# Patient Record
Sex: Male | Born: 1935 | Race: Black or African American | Hispanic: No | Smoking: Former smoker
Health system: Southern US, Community
[De-identification: ages and names within clinical notes are randomized; demographics above are authoritative.]

## PROBLEM LIST (undated history)

## (undated) DIAGNOSIS — Z9989 Dependence on other enabling machines and devices: Secondary | ICD-10-CM

## (undated) DIAGNOSIS — E119 Type 2 diabetes mellitus without complications: Secondary | ICD-10-CM

## (undated) DIAGNOSIS — J189 Pneumonia, unspecified organism: Secondary | ICD-10-CM

## (undated) DIAGNOSIS — M1990 Unspecified osteoarthritis, unspecified site: Secondary | ICD-10-CM

## (undated) DIAGNOSIS — R3915 Urgency of urination: Secondary | ICD-10-CM

## (undated) DIAGNOSIS — Z974 Presence of external hearing-aid: Secondary | ICD-10-CM

## (undated) DIAGNOSIS — N189 Chronic kidney disease, unspecified: Secondary | ICD-10-CM

## (undated) DIAGNOSIS — R519 Headache, unspecified: Secondary | ICD-10-CM

## (undated) DIAGNOSIS — R42 Dizziness and giddiness: Secondary | ICD-10-CM

## (undated) DIAGNOSIS — H409 Unspecified glaucoma: Secondary | ICD-10-CM

## (undated) DIAGNOSIS — C61 Malignant neoplasm of prostate: Secondary | ICD-10-CM

## (undated) DIAGNOSIS — E785 Hyperlipidemia, unspecified: Secondary | ICD-10-CM

## (undated) DIAGNOSIS — G609 Hereditary and idiopathic neuropathy, unspecified: Secondary | ICD-10-CM

## (undated) DIAGNOSIS — Z973 Presence of spectacles and contact lenses: Secondary | ICD-10-CM

## (undated) DIAGNOSIS — I10 Essential (primary) hypertension: Secondary | ICD-10-CM

## (undated) DIAGNOSIS — G4700 Insomnia, unspecified: Secondary | ICD-10-CM

## (undated) DIAGNOSIS — R609 Edema, unspecified: Secondary | ICD-10-CM

## (undated) DIAGNOSIS — H04123 Dry eye syndrome of bilateral lacrimal glands: Secondary | ICD-10-CM

## (undated) DIAGNOSIS — Z972 Presence of dental prosthetic device (complete) (partial): Secondary | ICD-10-CM

## (undated) DIAGNOSIS — E559 Vitamin D deficiency, unspecified: Secondary | ICD-10-CM

## (undated) DIAGNOSIS — R51 Headache: Secondary | ICD-10-CM

## (undated) DIAGNOSIS — I499 Cardiac arrhythmia, unspecified: Secondary | ICD-10-CM

## (undated) DIAGNOSIS — K219 Gastro-esophageal reflux disease without esophagitis: Secondary | ICD-10-CM

## (undated) DIAGNOSIS — H269 Unspecified cataract: Secondary | ICD-10-CM

## (undated) DIAGNOSIS — G4733 Obstructive sleep apnea (adult) (pediatric): Secondary | ICD-10-CM

## (undated) DIAGNOSIS — R159 Full incontinence of feces: Secondary | ICD-10-CM

## (undated) DIAGNOSIS — R531 Weakness: Secondary | ICD-10-CM

## (undated) DIAGNOSIS — Z87898 Personal history of other specified conditions: Secondary | ICD-10-CM

## (undated) HISTORY — PX: PROSTATECTOMY: SHX69

## (undated) HISTORY — PX: COLONOSCOPY: SHX174

## (undated) HISTORY — PX: EPIDIDYMIS SURGERY: SHX843

## (undated) HISTORY — PX: HEMORRHOID SURGERY: SHX153

## (undated) HISTORY — PX: CYSTOSCOPY: SUR368

## (undated) HISTORY — PX: PENILE PROSTHESIS IMPLANT: SHX240

## (undated) HISTORY — PX: ANKLE SURGERY: SHX546

## (undated) HISTORY — PX: CARDIAC CATHETERIZATION: SHX172

---

## 2011-10-14 DIAGNOSIS — H269 Unspecified cataract: Secondary | ICD-10-CM | POA: Insufficient documentation

## 2012-01-12 DIAGNOSIS — H409 Unspecified glaucoma: Secondary | ICD-10-CM | POA: Insufficient documentation

## 2015-03-15 DIAGNOSIS — C61 Malignant neoplasm of prostate: Secondary | ICD-10-CM | POA: Insufficient documentation

## 2015-03-15 DIAGNOSIS — N529 Male erectile dysfunction, unspecified: Secondary | ICD-10-CM | POA: Insufficient documentation

## 2015-03-15 DIAGNOSIS — R351 Nocturia: Secondary | ICD-10-CM | POA: Insufficient documentation

## 2015-03-15 DIAGNOSIS — Z8546 Personal history of malignant neoplasm of prostate: Secondary | ICD-10-CM | POA: Insufficient documentation

## 2015-04-08 HISTORY — PX: BIOPSY THYROID: PRO38

## 2015-10-17 DIAGNOSIS — N183 Chronic kidney disease, stage 3 unspecified: Secondary | ICD-10-CM

## 2016-01-16 DIAGNOSIS — K5900 Constipation, unspecified: Secondary | ICD-10-CM | POA: Insufficient documentation

## 2016-01-16 DIAGNOSIS — R350 Frequency of micturition: Secondary | ICD-10-CM | POA: Insufficient documentation

## 2016-01-16 DIAGNOSIS — N183 Chronic kidney disease, stage 3 unspecified: Secondary | ICD-10-CM

## 2017-01-02 DIAGNOSIS — E042 Nontoxic multinodular goiter: Secondary | ICD-10-CM | POA: Insufficient documentation

## 2017-10-29 DIAGNOSIS — R5383 Other fatigue: Secondary | ICD-10-CM | POA: Insufficient documentation

## 2017-10-29 DIAGNOSIS — D696 Thrombocytopenia, unspecified: Secondary | ICD-10-CM | POA: Insufficient documentation

## 2018-10-08 HISTORY — PX: ANAL RECTAL MANOMETRY: SHX6358

## 2018-10-11 ENCOUNTER — Encounter (HOSPITAL_COMMUNITY): Payer: Self-pay

## 2018-10-11 DIAGNOSIS — H04123 Dry eye syndrome of bilateral lacrimal glands: Secondary | ICD-10-CM | POA: Insufficient documentation

## 2018-10-11 NOTE — Pre-Procedure Instructions (Signed)
Dr. Tamala Julian surgical clearance 1010/2019 in hard chart.

## 2018-10-11 NOTE — Patient Instructions (Addendum)
Your procedure is scheduled on: Monday, Dec. 16, 2019   Surgery Time:  11:35AM-12:25PM   Report to [place]  Entrance    Report to admitting at 9:00 AM   Call this number if you have problems the morning of surgery [phone]   Do not eat food or drink liquids :After Midnight.   Brush your teeth the morning of surgery.   Do NOT smoke after Midnight   Take these medicines the morning of surgery with A SIP OF WATER: Amlodipine   Use eye drops per normal routine              You may not have any metal on your body including jewelry, and body piercings             Do not wear lotions, powders, perfumes/cologne, or deodorant                           Men may shave face and neck.   Do not bring valuables to the hospital. [place].   Contacts, dentures or bridgework may not be worn into surgery.   Leave suitcase in the car. After surgery it may be brought to your room.    Special Instructions: Bring a copy of your healthcare power of attorney and living will documents         the day of surgery if you haven't scanned them in before.              Please read over the following fact sheets you were given:  [facility] - Preparing for Surgery Before surgery, you can play an important role.  Because skin is not sterile, your skin needs to be as free of germs as possible.  You can reduce the number of germs on your skin by washing with CHG (chlorahexidine gluconate) soap before surgery.  CHG is an antiseptic cleaner which kills germs and bonds with the skin to continue killing germs even after washing. Please DO NOT use if you have an allergy to CHG or antibacterial soaps.  If your skin becomes reddened/irritated stop using the CHG and inform your nurse when you arrive at Short Stay. Do not shave (including legs and underarms) for at least 48 hours prior to the first CHG shower.  You may shave your  face/neck.  Please follow these instructions carefully:  1.  Shower with CHG Soap the night before surgery and the  morning of surgery.  2.  If you choose to wash your hair, wash your hair first as usual with your normal  shampoo.  3.  After you shampoo, rinse your hair and body thoroughly to remove the shampoo.                             4.  Use CHG as you would any other liquid soap.  You can apply chg directly to the skin and wash.  Gently with a scrungie or clean washcloth.  5.  Apply the CHG Soap to your body ONLY FROM THE NECK DOWN.   Do   not use on face/ open                           Wound or  open sores. Avoid contact with eyes, ears mouth and   genitals (private parts).                       Wash face,  Genitals (private parts) with your normal soap.             6.  Wash thoroughly, paying special attention to the area where your    surgery  will be performed.  7.  Thoroughly rinse your body with warm water from the neck down.  8.  DO NOT shower/wash with your normal soap after using and rinsing off the CHG Soap.                9.  Pat yourself dry with a clean towel.            10.  Wear clean pajamas.            11.  Place clean sheets on your bed the night of your first shower and do not  sleep with pets. Day of Surgery : Do not apply any lotions/deodorants the morning of surgery.  Please wear clean clothes to the hospital/surgery center.  FAILURE TO FOLLOW THESE INSTRUCTIONS MAY RESULT IN THE CANCELLATION OF YOUR SURGERY  PATIENT SIGNATURE_________________________________  NURSE SIGNATURE__________________________________  ________________________________________________________________________   Adam Phenix  An incentive spirometer is a tool that can help keep your lungs clear and active. This tool measures how well you are filling your lungs with each breath. Taking long deep breaths may help reverse or decrease the chance of developing breathing (pulmonary)  problems (especially infection) following:  A long period of time when you are unable to move or be active. BEFORE THE PROCEDURE   If the spirometer includes an indicator to show your best effort, your nurse or respiratory therapist will set it to a desired goal.  If possible, sit up straight or lean slightly forward. Try not to slouch.  Hold the incentive spirometer in an upright position. INSTRUCTIONS FOR USE  1. Sit on the edge of your bed if possible, or sit up as far as you can in bed or on a chair. 2. Hold the incentive spirometer in an upright position. 3. Breathe out normally. 4. Place the mouthpiece in your mouth and seal your lips tightly around it. 5. Breathe in slowly and as deeply as possible, raising the piston or the ball toward the top of the column. 6. Hold your breath for 3-5 seconds or for as long as possible. Allow the piston or ball to fall to the bottom of the column. 7. Remove the mouthpiece from your mouth and breathe out normally. 8. Rest for a few seconds and repeat Steps 1 through 7 at least 10 times every 1-2 hours when you are awake. Take your time and take a few normal breaths between deep breaths. 9. The spirometer may include an indicator to show your best effort. Use the indicator as a goal to work toward during each repetition. 10. After each set of 10 deep breaths, practice coughing to be sure your lungs are clear. If you have an incision (the cut made at the time of surgery), support your incision when coughing by placing a pillow or rolled up towels firmly against it. Once you are able to get out of bed, walk around indoors and cough well. You may stop using the incentive spirometer when instructed by your caregiver.  RISKS AND COMPLICATIONS  Take your time so you  do not get dizzy or light-headed.  If you are in pain, you may need to take or ask for pain medication before doing incentive spirometry. It is harder to take a deep breath if you are having  pain. AFTER USE  Rest and breathe slowly and easily.  It can be helpful to keep track of a log of your progress. Your caregiver can provide you with a simple table to help with this. If you are using the spirometer at home, follow these instructions: [place] IF:   You are having difficultly using the spirometer.  You have trouble using the spirometer as often as instructed.  Your pain medication is not giving enough relief while using the spirometer.  You develop fever of 100.5 F (38.1 C) or higher. SEEK IMMEDIATE MEDICAL CARE IF:   You cough up bloody sputum that had not been present before.  You develop fever of 102 F (38.9 C) or greater.  You develop worsening pain at or near the incision site. MAKE SURE YOU:   Understand these instructions.  Will watch your condition.  Will get help right away if you are not doing well or get worse. Document Released: 03/02/2007 Document Revised: 01/12/2012 Document Reviewed: 05/03/2007 ExitCare Patient Information 2014 Exit[employer], [place].   ________________________________________________________________________  WHAT IS A BLOOD TRANSFUSION? Blood Transfusion Information  A transfusion is the replacement of blood or some of its parts. Blood is made up of multiple cells which provide different functions.  Red blood cells carry oxygen and are used for blood loss replacement.  White blood cells fight against infection.  Platelets control bleeding.  Plasma helps clot blood.  Other blood products are available for specialized needs, such as hemophilia or other clotting disorders. BEFORE THE TRANSFUSION  Who gives blood for transfusions?   Healthy volunteers who are fully evaluated to make sure their blood is safe. This is blood bank blood. Transfusion therapy is the safest it has ever been in the practice of medicine. Before blood is taken from a donor, a complete history is taken to make sure that person has no history  of diseases nor engages in risky social behavior (examples are intravenous drug use or sexual activity with multiple partners). The donor's travel history is screened to minimize risk of transmitting infections, such as malaria. The donated blood is tested for signs of infectious diseases, such as HIV and hepatitis. The blood is then tested to be sure it is compatible with you in order to minimize the chance of a transfusion reaction. If you or a relative donates blood, this is often done in anticipation of surgery and is not appropriate for emergency situations. It takes many days to process the donated blood. RISKS AND COMPLICATIONS Although transfusion therapy is very safe and saves many lives, the main dangers of transfusion include:   Getting an infectious disease.  Developing a transfusion reaction. This is an allergic reaction to something in the blood you were given. Every precaution is taken to prevent this. The decision to have a blood transfusion has been considered carefully by your caregiver before blood is given. Blood is not given unless the benefits outweigh the risks. AFTER THE TRANSFUSION  Right after receiving a blood transfusion, you will usually feel much better and more energetic. This is especially true if your red blood cells have gotten low (anemic). The transfusion raises the level of the red blood cells which carry oxygen, and this usually causes an energy increase.  The nurse administering  the transfusion will monitor you carefully for complications. HOME CARE INSTRUCTIONS  No special instructions are needed after a transfusion. You may find your energy is better. Speak with your caregiver about any limitations on activity for underlying diseases you may have. SEEK MEDICAL CARE IF:   Your condition is not improving after your transfusion.  You develop redness or irritation at the intravenous (IV) site. SEEK IMMEDIATE MEDICAL CARE IF:  Any of the following symptoms  occur over the next 12 hours:  Shaking chills.  You have a temperature by mouth above 102 F (38.9 C), not controlled by medicine.  Chest, back, or muscle pain.  People around you feel you are not acting correctly or are confused.  Shortness of breath or difficulty breathing.  Dizziness and fainting.  You get a rash or develop hives.  You have a decrease in urine output.  Your urine turns a dark color or changes to pink, red, or brown. Any of the following symptoms occur over the next 10 days:  You have a temperature by mouth above 102 F (38.9 C), not controlled by medicine.  Shortness of breath.  Weakness after normal activity.  The white part of the eye turns yellow (jaundice).  You have a decrease in the amount of urine or are urinating less often.  Your urine turns a dark color or changes to pink, red, or brown. Document Released: 10/17/2000 Document Revised: 01/12/2012 Document Reviewed: 06/05/2008 [facility] Patient Information 2014 [place], [place].  _______________________________________________________________________

## 2018-10-12 ENCOUNTER — Encounter (HOSPITAL_COMMUNITY): Payer: Self-pay

## 2018-10-12 ENCOUNTER — Other Ambulatory Visit: Payer: Self-pay

## 2018-10-12 ENCOUNTER — Encounter (HOSPITAL_COMMUNITY)
Admission: RE | Admit: 2018-10-12 | Discharge: 2018-10-12 | Disposition: A | Discharge status: Home / Self Care | Payer: Medicare HMO | Source: Ambulatory Visit | Attending: Orthopedic Surgery | Admitting: Orthopedic Surgery

## 2018-10-12 DIAGNOSIS — Z01812 Encounter for preprocedural laboratory examination: Secondary | ICD-10-CM | POA: Diagnosis present

## 2018-10-12 HISTORY — DX: Essential (primary) hypertension: I10

## 2018-10-12 HISTORY — DX: Headache, unspecified: R51.9

## 2018-10-12 HISTORY — DX: Insomnia, unspecified: G47.00

## 2018-10-12 HISTORY — DX: Full incontinence of feces: R15.9

## 2018-10-12 HISTORY — DX: Urgency of urination: R39.15

## 2018-10-12 HISTORY — DX: Malignant neoplasm of prostate: C61

## 2018-10-12 HISTORY — DX: Hereditary and idiopathic neuropathy, unspecified: G60.9

## 2018-10-12 HISTORY — DX: Chronic kidney disease, unspecified: N18.9

## 2018-10-12 HISTORY — DX: Dependence on other enabling machines and devices: Z99.89

## 2018-10-12 HISTORY — DX: Hyperlipidemia, unspecified: E78.5

## 2018-10-12 HISTORY — DX: Obstructive sleep apnea (adult) (pediatric): G47.33

## 2018-10-12 HISTORY — DX: Gastro-esophageal reflux disease without esophagitis: K21.9

## 2018-10-12 HISTORY — DX: Unspecified cataract: H26.9

## 2018-10-12 HISTORY — DX: Unspecified glaucoma: H40.9

## 2018-10-12 HISTORY — DX: Vitamin D deficiency, unspecified: E55.9

## 2018-10-12 HISTORY — DX: Weakness: R53.1

## 2018-10-12 HISTORY — DX: Dizziness and giddiness: R42

## 2018-10-12 HISTORY — DX: Headache: R51

## 2018-10-12 HISTORY — DX: Personal history of other specified conditions: Z87.898

## 2018-10-12 HISTORY — DX: Presence of dental prosthetic device (complete) (partial): Z97.2

## 2018-10-12 HISTORY — DX: Edema, unspecified: R60.9

## 2018-10-12 HISTORY — DX: Type 2 diabetes mellitus without complications: E11.9

## 2018-10-12 HISTORY — DX: Presence of external hearing-aid: Z97.4

## 2018-10-12 HISTORY — DX: Presence of spectacles and contact lenses: Z97.3

## 2018-10-12 HISTORY — DX: Unspecified osteoarthritis, unspecified site: M19.90

## 2018-10-12 HISTORY — DX: Dry eye syndrome of bilateral lacrimal glands: H04.123

## 2018-10-12 LAB — COMPREHENSIVE METABOLIC PANEL
ALT: 18 U/L (ref 0–44)
AST: 22 U/L (ref 15–41)
Albumin: 3.7 g/dL (ref 3.5–5.0)
Alkaline Phosphatase: 56 U/L (ref 38–126)
Anion gap: 7 (ref 5–15)
BUN: 18 mg/dL (ref 8–23)
CO2: 24 mmol/L (ref 22–32)
Calcium: 8.9 mg/dL (ref 8.9–10.3)
Chloride: 111 mmol/L (ref 98–111)
Creatinine, Ser: 1.34 mg/dL — ABNORMAL HIGH (ref 0.61–1.24)
GFR calc Af Amer: 58 mL/min — ABNORMAL LOW (ref >60)
GFR calc non Af Amer: 50 mL/min — ABNORMAL LOW (ref >60)
Glucose, Bld: 89 mg/dL (ref 70–99)
Potassium: 3.8 mmol/L (ref 3.5–5.1)
SODIUM: 142 mmol/L (ref 135–145)
Total Bilirubin: 0.7 mg/dL (ref 0.3–1.2)
Total Protein: 6.5 g/dL (ref 6.5–8.1)

## 2018-10-12 LAB — CBC
HCT: 43.8 % (ref 39.0–52.0)
Hemoglobin: 14.1 g/dL (ref 13.0–17.0)
MCH: 27.9 pg (ref 26.0–34.0)
MCHC: 32.2 g/dL (ref 30.0–36.0)
MCV: 86.7 fL (ref 80.0–100.0)
Platelets: 149 10*3/uL — ABNORMAL LOW (ref 150–400)
RBC: 5.05 MIL/uL (ref 4.22–5.81)
RDW: 13.8 % (ref 11.5–15.5)
WBC: 3.7 10*3/uL — ABNORMAL LOW (ref 4.0–10.5)
nRBC: 0 % (ref 0.0–0.2)

## 2018-10-12 LAB — HEMOGLOBIN A1C
Hgb A1c MFr Bld: 5.6 % (ref 4.8–5.6)
MEAN PLASMA GLUCOSE: 114.02 mg/dL

## 2018-10-12 LAB — PROTIME-INR
INR: 1.09
Prothrombin Time: 14 seconds (ref 11.4–15.2)

## 2018-10-12 LAB — APTT: APTT: 34 s (ref 24–36)

## 2018-10-12 LAB — SURGICAL PCR SCREEN
MRSA, PCR: NEGATIVE
Staphylococcus aureus: NEGATIVE

## 2018-10-12 NOTE — Pre-Procedure Instructions (Addendum)
EKG 10/04/2018 in hard chart.  CBC and CMP results 10/12/2018 sent to Dr. Wynelle Link via epic.

## 2018-10-13 LAB — ABO/RH: ABO/RH(D): O POS

## 2018-10-15 NOTE — H&P (Signed)
TOTAL KNEE ADMISSION H&P  Patient is being admitted for right total knee arthroplasty.  Subjective:  Chief Complaint:right knee pain.  HPI: Drew Hudson, 82 y.o. male, has a history of pain and functional disability in the right knee due to arthritis and has failed non-surgical conservative treatments for greater than 12 weeks to includecorticosteriod injections, viscosupplementation injections and activity modification.  Onset of symptoms was gradual, starting >10 years ago with gradually worsening course since that time. The patient noted no past surgery on the right knee(s).  Patient currently rates pain in the right knee(s) at 9 out of 10 with activity. Patient has worsening of pain with activity and weight bearing, crepitus, joint swelling and instability.  Patient has evidence of bone-on-bone arthritis in the medial and patellofemoral compartments by imaging studies. There is no active infection.  There are no active problems to display for this patient.  Past Medical History:  Diagnosis Date  . Arthritis   . Cataract   . Chronic kidney disease    Stage 3 per pt  . Diabetes mellitus without complication (HCC)    diet controlled  . Dizziness   . Dry eyes   . Edema    Bilateral legs  . Fecal incontinence   . Generalized weakness   . GERD (gastroesophageal reflux disease)   . Glaucoma   . Headache   . History of chest pain    Saw cardiologist, no blockages noted, possible gas pains  . Hyperlipidemia   . Hypertension   . Idiopathic peripheral neuropathy   . Insomnia   . OSA on CPAP   . Prostate cancer ([place])   . Urinary urgency   . Vitamin D deficiency   . Wears glasses   . Wears hearing aid in both ears   . Wears partial dentures     Past Surgical History:  Procedure Laterality Date  . ANAL RECTAL MANOMETRY  10/08/2018  . ANKLE SURGERY    . BIOPSY THYROID Left 04/08/2015  . CARDIAC CATHETERIZATION    . COLONOSCOPY    . CYSTOSCOPY    . EPIDIDYMIS SURGERY    .  HEMORRHOID SURGERY    . PENILE PROSTHESIS IMPLANT    . PROSTATECTOMY      No current facility-administered medications for this encounter.    Current Outpatient Medications  Medication Sig Dispense Refill Last Dose  . amLODipine (NORVASC) 10 MG tablet Take 10 mg by mouth daily.  2   . aspirin EC 81 MG tablet Take 81 mg by mouth daily.     Marland Kitchen atorvastatin (LIPITOR) 20 MG tablet Take 20 mg by mouth daily.  0   . dorzolamide-timolol (COSOPT) 22.3-6.8 MG/ML ophthalmic solution Place 1 drop into both eyes 2 (two) times daily.  0   . hydrocortisone 2.5 % cream Apply 1 application topically daily as needed (itching).     Marland Kitchen latanoprost (XALATAN) 0.005 % ophthalmic solution Place 1 drop into both eyes every morning.  0   . lisinopril (PRINIVIL,ZESTRIL) 20 MG tablet Take 20 mg by mouth daily.  1   . omeprazole (PRILOSEC) 20 MG capsule Take 20 mg by mouth daily.  3   . polyethylene glycol (MIRALAX / GLYCOLAX) packet Take 17 g by mouth daily as needed for moderate constipation.     . sertraline (ZOLOFT) 25 MG tablet Take 25 mg by mouth at bedtime.  1   . vitamin E 400 UNIT capsule Take 400 Units by mouth daily.      Allergies  Allergen Reactions  . Penicillins Rash    Has patient had a PCN reaction causing immediate rash, facial/tongue/throat swelling, SOB or lightheadedness with hypotension: No Has patient had a PCN reaction causing severe rash involving mucus membranes or skin necrosis: No Has patient had a PCN reaction that required hospitalization: No Has patient had a PCN reaction occurring within the last 10 years: Yes If all of the above answers are "NO", then may proceed with Cephalosporin use.     Social History   Tobacco Use  . Smoking status: Former Research scientist (life sciences)  . Smokeless tobacco: Never Used  Substance Use Topics  . Alcohol use: Never    Frequency: Never    No family history on file.   Review of Systems  Constitutional: Negative for chills and fever.  HENT: Negative for  congestion, sore throat and tinnitus.   Eyes: Negative for double vision, photophobia and pain.  Respiratory: Negative for cough, shortness of breath and wheezing.   Cardiovascular: Negative for chest pain, palpitations and orthopnea.  Gastrointestinal: Negative for heartburn, nausea and vomiting.  Genitourinary: Negative for dysuria, frequency and urgency.  Musculoskeletal: Positive for joint pain.  Neurological: Negative for dizziness, weakness and headaches.    Objective:  Physical Exam  Well nourished and well developed. General: Alert and oriented x3, cooperative and pleasant, no acute distress. Head: normocephalic, atraumatic, neck supple. Eyes: EOMI. Respiratory: breath sounds clear in all fields, no wheezing, rales, or rhonchi. Cardiovascular: Regular rate and rhythm, no murmurs, gallops or rubs.  Abdomen: non-tender to palpation and soft, normoactive bowel sounds. Musculoskeletal: Antalgic gait pattern favoring the right side without using assisted devices.  Right Hip Exam: ROM: Normal without discomfort. There is no tenderness over the greater trochanter bursa. Right Knee Exam: Moderate effusion. Varus deformity. Range of motion is 5-125 degrees. Moderate crepitus on range of motion of the knee. Positive medial, greater than lateral, joint line tenderness. Stable knee. Left Hip Exam: ROM: Normal without discomfort. There is no tenderness over the greater trochanter bursa.  Left Knee Exam: No effusion. Range of motion is 0-135 degrees. No crepitus on range of motion of the knee. No medial joint line tenderness. No lateral joint line tenderness. Stable knee. Calves soft and nontender. Motor function intact in LE. Strength 5/5 LE bilaterally. Neuro: Distal pulses 2+. Sensation to light touch intact in LE.  Estimated body mass index is 30.94 kg/m as calculated from the following:   Height as of 10/12/18: 5' 11.5" (1.816 m).   Weight as of 10/12/18: 102.1 kg.   Imaging  Review Plain radiographs demonstrate severe degenerative joint disease of the right knee(s). The overall alignment isneutral. The bone quality appears to be adequate for age and reported activity level.   Preoperative templating of the joint replacement has been completed, documented, and submitted to the Operating Room personnel in order to optimize intra-operative equipment management.   Anticipated LOS equal to or greater than 2 midnights due to - Age 60 and older with one or more of the following:  - Obesity  - Expected need for hospital services (PT, OT, Nursing) required for safe  discharge  - Anticipated need for postoperative skilled nursing care or inpatient rehab  - Active co-morbidities: Diabetes OR   - Unanticipated findings during/Post Surgery: None  - Patient is a high risk of re-admission due to: None     Assessment/Plan:  End stage arthritis, right knee   The patient history, physical examination, clinical judgment of the provider and imaging studies are  consistent with end stage degenerative joint disease of the right knee(s) and total knee arthroplasty is deemed medically necessary. The treatment options including medical management, injection therapy arthroscopy and arthroplasty were discussed at length. The risks and benefits of total knee arthroplasty were presented and reviewed. The risks due to aseptic loosening, infection, stiffness, patella tracking problems, thromboembolic complications and other imponderables were discussed. The patient acknowledged the explanation, agreed to proceed with the plan and consent was signed. Patient is being admitted for inpatient treatment for surgery, pain control, PT, OT, prophylactic antibiotics, VTE prophylaxis, progressive ambulation and ADL's and discharge planning. The patient is planning to be discharged home with outpatient physical therapy.   Therapy Plans: outpatient physical therapy at [facility] Disposition: Home with  wife Planned DVT Prophylaxis: Xarelto 10 mg daily (hx prostate CA) DME needed: Gilford Rile, 3-n-1 PCP: Sinclair Ship, MD TXA: Iv Allergies: PCN (hives) Anesthesia Concerns: None Last HgbA1c: 5.6%  - Patient was instructed on what medications to stop prior to surgery. - Follow-up visit in 2 weeks with Dr. Wynelle Link - Begin physical therapy following surgery - Pre-operative lab work as pre-surgical testing - Prescriptions will be provided in hospital at time of discharge  Theresa Duty, PA-C Orthopedic Surgery [facility] [place]

## 2018-10-17 MED ORDER — BUPIVACAINE LIPOSOME 1.3 % IJ SUSP
20.0000 mL | Freq: Once | INTRAMUSCULAR | Status: DC
  Filled 2018-10-17: qty 20

## 2018-10-18 ENCOUNTER — Encounter (HOSPITAL_COMMUNITY)
Admission: RE | Disposition: A | Discharge status: Home / Self Care | Payer: Self-pay | Source: Ambulatory Visit | Attending: Orthopedic Surgery

## 2018-10-18 ENCOUNTER — Other Ambulatory Visit: Payer: Self-pay

## 2018-10-18 ENCOUNTER — Observation Stay (HOSPITAL_COMMUNITY)
Admission: RE | Admit: 2018-10-18 | Discharge: 2018-10-20 | Disposition: A | Discharge status: Home / Self Care | Payer: Medicare HMO | Source: Ambulatory Visit | Attending: Orthopedic Surgery | Admitting: Orthopedic Surgery

## 2018-10-18 ENCOUNTER — Ambulatory Visit (HOSPITAL_COMMUNITY): Payer: Medicare HMO | Admitting: Anesthesiology

## 2018-10-18 ENCOUNTER — Encounter (HOSPITAL_COMMUNITY): Payer: Self-pay

## 2018-10-18 DIAGNOSIS — H409 Unspecified glaucoma: Secondary | ICD-10-CM | POA: Insufficient documentation

## 2018-10-18 DIAGNOSIS — R609 Edema, unspecified: Secondary | ICD-10-CM | POA: Insufficient documentation

## 2018-10-18 DIAGNOSIS — E669 Obesity, unspecified: Secondary | ICD-10-CM | POA: Diagnosis not present

## 2018-10-18 DIAGNOSIS — G4733 Obstructive sleep apnea (adult) (pediatric): Secondary | ICD-10-CM | POA: Diagnosis not present

## 2018-10-18 DIAGNOSIS — I129 Hypertensive chronic kidney disease with stage 1 through stage 4 chronic kidney disease, or unspecified chronic kidney disease: Secondary | ICD-10-CM | POA: Insufficient documentation

## 2018-10-18 DIAGNOSIS — M1711 Unilateral primary osteoarthritis, right knee: Principal | ICD-10-CM | POA: Insufficient documentation

## 2018-10-18 DIAGNOSIS — N183 Chronic kidney disease, stage 3 (moderate): Secondary | ICD-10-CM | POA: Insufficient documentation

## 2018-10-18 DIAGNOSIS — E1122 Type 2 diabetes mellitus with diabetic chronic kidney disease: Secondary | ICD-10-CM | POA: Insufficient documentation

## 2018-10-18 DIAGNOSIS — E785 Hyperlipidemia, unspecified: Secondary | ICD-10-CM | POA: Diagnosis not present

## 2018-10-18 DIAGNOSIS — Z79899 Other long term (current) drug therapy: Secondary | ICD-10-CM | POA: Insufficient documentation

## 2018-10-18 DIAGNOSIS — Z87891 Personal history of nicotine dependence: Secondary | ICD-10-CM | POA: Insufficient documentation

## 2018-10-18 DIAGNOSIS — Z8546 Personal history of malignant neoplasm of prostate: Secondary | ICD-10-CM | POA: Insufficient documentation

## 2018-10-18 DIAGNOSIS — M179 Osteoarthritis of knee, unspecified: Secondary | ICD-10-CM | POA: Diagnosis present

## 2018-10-18 DIAGNOSIS — Z7982 Long term (current) use of aspirin: Secondary | ICD-10-CM | POA: Insufficient documentation

## 2018-10-18 DIAGNOSIS — Z6830 Body mass index (BMI) 30.0-30.9, adult: Secondary | ICD-10-CM | POA: Diagnosis not present

## 2018-10-18 DIAGNOSIS — M1710 Unilateral primary osteoarthritis, unspecified knee: Secondary | ICD-10-CM | POA: Diagnosis present

## 2018-10-18 DIAGNOSIS — K219 Gastro-esophageal reflux disease without esophagitis: Secondary | ICD-10-CM | POA: Diagnosis not present

## 2018-10-18 HISTORY — PX: TOTAL KNEE ARTHROPLASTY: SHX125

## 2018-10-18 LAB — GLUCOSE, CAPILLARY
GLUCOSE-CAPILLARY: 86 mg/dL (ref 70–99)
GLUCOSE-CAPILLARY: 187 mg/dL — AB (ref 70–99)
GLUCOSE-CAPILLARY: 168 mg/dL — AB (ref 70–99)

## 2018-10-18 LAB — TYPE AND SCREEN
ABO/RH(D): O POS
Antibody Screen: NEGATIVE

## 2018-10-18 SURGERY — ARTHROPLASTY, KNEE, TOTAL
Anesthesia: General | Site: Knee | Laterality: Right

## 2018-10-18 MED ORDER — SUGAMMADEX SODIUM 200 MG/2ML IV SOLN
INTRAVENOUS | Status: AC
  Filled 2018-10-18: qty 2

## 2018-10-18 MED ORDER — METHOCARBAMOL 500 MG IVPB - SIMPLE MED
500.0000 mg | Freq: Four times a day (QID) | INTRAVENOUS | Status: DC | PRN
  Administered 2018-10-18: 500 mg via INTRAVENOUS
  Filled 2018-10-18: qty 50

## 2018-10-18 MED ORDER — DORZOLAMIDE HCL-TIMOLOL MAL 2-0.5 % OP SOLN
1.0000 [drp] | Freq: Two times a day (BID) | OPHTHALMIC | Status: DC
  Administered 2018-10-18 – 2018-10-20 (×4): 1 [drp] via OPHTHALMIC
  Filled 2018-10-18: qty 10

## 2018-10-18 MED ORDER — ACETAMINOPHEN 500 MG PO TABS
1000.0000 mg | ORAL_TABLET | Freq: Four times a day (QID) | ORAL | Status: AC
  Administered 2018-10-18 – 2018-10-19 (×4): 1000 mg via ORAL
  Filled 2018-10-18 (×4): qty 2

## 2018-10-18 MED ORDER — VANCOMYCIN HCL IN DEXTROSE 1-5 GM/200ML-% IV SOLN: 1000.0000 mg | Freq: Once | INTRAVENOUS | Status: DC

## 2018-10-18 MED ORDER — BISACODYL 10 MG RE SUPP: 10.0000 mg | Freq: Every day | RECTAL | Status: DC | PRN

## 2018-10-18 MED ORDER — STERILE WATER FOR IRRIGATION IR SOLN
Status: DC | PRN
  Administered 2018-10-18: 2000 mL

## 2018-10-18 MED ORDER — MORPHINE SULFATE (PF) 2 MG/ML IV SOLN: 1.0000 mg | INTRAVENOUS | Status: DC | PRN

## 2018-10-18 MED ORDER — PANTOPRAZOLE SODIUM 40 MG PO TBEC
40.0000 mg | DELAYED_RELEASE_TABLET | Freq: Every day | ORAL | Status: DC
  Administered 2018-10-19 – 2018-10-20 (×2): 40 mg via ORAL
  Filled 2018-10-18 (×2): qty 1

## 2018-10-18 MED ORDER — EPHEDRINE 5 MG/ML INJ
INTRAVENOUS | Status: AC
  Filled 2018-10-18: qty 10

## 2018-10-18 MED ORDER — OXYCODONE HCL 5 MG/5ML PO SOLN
5.0000 mg | Freq: Once | ORAL | Status: DC | PRN
  Filled 2018-10-18: qty 5

## 2018-10-18 MED ORDER — CHLORHEXIDINE GLUCONATE 4 % EX LIQD: 60.0000 mL | Freq: Once | CUTANEOUS | Status: DC

## 2018-10-18 MED ORDER — FENTANYL CITRATE (PF) 100 MCG/2ML IJ SOLN
INTRAMUSCULAR | Status: AC
  Administered 2018-10-18: 50 ug via INTRAVENOUS
  Filled 2018-10-18: qty 2

## 2018-10-18 MED ORDER — METHOCARBAMOL 500 MG PO TABS
500.0000 mg | ORAL_TABLET | Freq: Four times a day (QID) | ORAL | Status: DC | PRN
  Administered 2018-10-19 (×2): 500 mg via ORAL
  Filled 2018-10-18 (×2): qty 1

## 2018-10-18 MED ORDER — DIPHENHYDRAMINE HCL 12.5 MG/5ML PO ELIX: 12.5000 mg | ORAL_SOLUTION | ORAL | Status: DC | PRN

## 2018-10-18 MED ORDER — LATANOPROST 0.005 % OP SOLN
1.0000 [drp] | Freq: Every morning | OPHTHALMIC | Status: DC
  Administered 2018-10-19 – 2018-10-20 (×2): 1 [drp] via OPHTHALMIC
  Filled 2018-10-18: qty 2.5

## 2018-10-18 MED ORDER — ONDANSETRON HCL 4 MG/2ML IJ SOLN: 4.0000 mg | Freq: Four times a day (QID) | INTRAMUSCULAR | Status: DC | PRN

## 2018-10-18 MED ORDER — MIDAZOLAM HCL 2 MG/2ML IJ SOLN: 0.5000 mg | INTRAMUSCULAR | Status: DC

## 2018-10-18 MED ORDER — FENTANYL CITRATE (PF) 100 MCG/2ML IJ SOLN
INTRAMUSCULAR | Status: DC | PRN
  Administered 2018-10-18 (×3): 50 ug via INTRAVENOUS

## 2018-10-18 MED ORDER — AMLODIPINE BESYLATE 10 MG PO TABS
10.0000 mg | ORAL_TABLET | Freq: Every day | ORAL | Status: DC
  Administered 2018-10-19 – 2018-10-20 (×2): 10 mg via ORAL
  Filled 2018-10-18 (×2): qty 1

## 2018-10-18 MED ORDER — SODIUM CHLORIDE 0.9 % IV SOLN
INTRAVENOUS | Status: DC
  Administered 2018-10-19: 04:00:00 via INTRAVENOUS

## 2018-10-18 MED ORDER — LACTATED RINGERS IV SOLN
INTRAVENOUS | Status: DC
  Administered 2018-10-18: 1000 mL via INTRAVENOUS
  Administered 2018-10-18: 13:00:00 via INTRAVENOUS

## 2018-10-18 MED ORDER — FLEET ENEMA 7-19 GM/118ML RE ENEM: 1.0000 | ENEMA | Freq: Once | RECTAL | Status: DC | PRN

## 2018-10-18 MED ORDER — DEXAMETHASONE SODIUM PHOSPHATE 10 MG/ML IJ SOLN
10.0000 mg | Freq: Once | INTRAMUSCULAR | Status: AC
  Administered 2018-10-19: 10 mg via INTRAVENOUS
  Filled 2018-10-18: qty 1

## 2018-10-18 MED ORDER — TRANEXAMIC ACID-NACL 1000-0.7 MG/100ML-% IV SOLN
1000.0000 mg | INTRAVENOUS | Status: AC
  Administered 2018-10-18: 1000 mg via INTRAVENOUS
  Filled 2018-10-18: qty 100

## 2018-10-18 MED ORDER — METHOCARBAMOL 500 MG IVPB - SIMPLE MED
INTRAVENOUS | Status: AC
  Administered 2018-10-18: 500 mg via INTRAVENOUS
  Filled 2018-10-18: qty 50

## 2018-10-18 MED ORDER — PROPOFOL 10 MG/ML IV BOLUS
INTRAVENOUS | Status: AC
  Filled 2018-10-18: qty 40

## 2018-10-18 MED ORDER — SODIUM CHLORIDE (PF) 0.9 % IJ SOLN
INTRAMUSCULAR | Status: AC
  Filled 2018-10-18: qty 10

## 2018-10-18 MED ORDER — DEXAMETHASONE SODIUM PHOSPHATE 10 MG/ML IJ SOLN
8.0000 mg | Freq: Once | INTRAMUSCULAR | Status: AC
  Administered 2018-10-18: 10 mg via INTRAVENOUS

## 2018-10-18 MED ORDER — FENTANYL CITRATE (PF) 100 MCG/2ML IJ SOLN
50.0000 ug | INTRAMUSCULAR | Status: DC
  Administered 2018-10-18: 50 ug via INTRAVENOUS
  Filled 2018-10-18: qty 2

## 2018-10-18 MED ORDER — SODIUM CHLORIDE (PF) 0.9 % IJ SOLN
INTRAMUSCULAR | Status: AC
  Filled 2018-10-18: qty 50

## 2018-10-18 MED ORDER — MENTHOL 3 MG MT LOZG: 1.0000 | LOZENGE | OROMUCOSAL | Status: DC | PRN

## 2018-10-18 MED ORDER — SUGAMMADEX SODIUM 200 MG/2ML IV SOLN
INTRAVENOUS | Status: DC | PRN
  Administered 2018-10-18: 200 mg via INTRAVENOUS

## 2018-10-18 MED ORDER — VANCOMYCIN HCL IN DEXTROSE 1-5 GM/200ML-% IV SOLN
1000.0000 mg | Freq: Once | INTRAVENOUS | Status: AC
  Administered 2018-10-18: 1000 mg via INTRAVENOUS
  Filled 2018-10-18: qty 200

## 2018-10-18 MED ORDER — PHENOL 1.4 % MT LIQD: 1.0000 | OROMUCOSAL | Status: DC | PRN

## 2018-10-18 MED ORDER — ONDANSETRON HCL 4 MG/2ML IJ SOLN
INTRAMUSCULAR | Status: AC
  Filled 2018-10-18: qty 2

## 2018-10-18 MED ORDER — ASPIRIN EC 325 MG PO TBEC
325.0000 mg | DELAYED_RELEASE_TABLET | Freq: Two times a day (BID) | ORAL | Status: DC
  Administered 2018-10-19 – 2018-10-20 (×3): 325 mg via ORAL
  Filled 2018-10-18 (×3): qty 1

## 2018-10-18 MED ORDER — VANCOMYCIN HCL 10 G IV SOLR
1500.0000 mg | INTRAVENOUS | Status: AC
  Administered 2018-10-18: 1500 mg via INTRAVENOUS
  Filled 2018-10-18: qty 1500

## 2018-10-18 MED ORDER — ONDANSETRON HCL 4 MG/2ML IJ SOLN: 4.0000 mg | Freq: Once | INTRAMUSCULAR | Status: DC | PRN

## 2018-10-18 MED ORDER — FENTANYL CITRATE (PF) 250 MCG/5ML IJ SOLN
INTRAMUSCULAR | Status: AC
  Filled 2018-10-18: qty 5

## 2018-10-18 MED ORDER — PROPOFOL 10 MG/ML IV BOLUS
INTRAVENOUS | Status: DC | PRN
  Administered 2018-10-18: 110 mg via INTRAVENOUS

## 2018-10-18 MED ORDER — SODIUM CHLORIDE 0.9 % IR SOLN
Status: DC | PRN
  Administered 2018-10-18: 1000 mL

## 2018-10-18 MED ORDER — DEXAMETHASONE SODIUM PHOSPHATE 10 MG/ML IJ SOLN
INTRAMUSCULAR | Status: AC
  Filled 2018-10-18: qty 1

## 2018-10-18 MED ORDER — ACETAMINOPHEN 10 MG/ML IV SOLN
1000.0000 mg | Freq: Four times a day (QID) | INTRAVENOUS | Status: DC
  Administered 2018-10-18: 1000 mg via INTRAVENOUS
  Filled 2018-10-18: qty 100

## 2018-10-18 MED ORDER — SODIUM CHLORIDE (PF) 0.9 % IJ SOLN
INTRAMUSCULAR | Status: DC | PRN
  Administered 2018-10-18: 60 mL

## 2018-10-18 MED ORDER — METOCLOPRAMIDE HCL 5 MG PO TABS: 5.0000 mg | ORAL_TABLET | Freq: Three times a day (TID) | ORAL | Status: DC | PRN

## 2018-10-18 MED ORDER — LIDOCAINE 2% (20 MG/ML) 5 ML SYRINGE
INTRAMUSCULAR | Status: DC | PRN
  Administered 2018-10-18: 50 mg via INTRAVENOUS

## 2018-10-18 MED ORDER — ROPIVACAINE HCL 7.5 MG/ML IJ SOLN
INTRAMUSCULAR | Status: DC | PRN
  Administered 2018-10-18: 20 mL via PERINEURAL

## 2018-10-18 MED ORDER — LIDOCAINE 2% (20 MG/ML) 5 ML SYRINGE
INTRAMUSCULAR | Status: AC
  Filled 2018-10-18: qty 5

## 2018-10-18 MED ORDER — 0.9 % SODIUM CHLORIDE (POUR BTL) OPTIME
TOPICAL | Status: DC | PRN
  Administered 2018-10-18: 1000 mL

## 2018-10-18 MED ORDER — TRAMADOL HCL 50 MG PO TABS
50.0000 mg | ORAL_TABLET | Freq: Four times a day (QID) | ORAL | Status: DC | PRN
  Administered 2018-10-18 – 2018-10-19 (×2): 50 mg via ORAL
  Administered 2018-10-19 – 2018-10-20 (×2): 100 mg via ORAL
  Filled 2018-10-18 (×4): qty 1
  Filled 2018-10-18: qty 2

## 2018-10-18 MED ORDER — DOCUSATE SODIUM 100 MG PO CAPS
100.0000 mg | ORAL_CAPSULE | Freq: Two times a day (BID) | ORAL | Status: DC
  Administered 2018-10-18 – 2018-10-20 (×5): 100 mg via ORAL
  Filled 2018-10-18 (×5): qty 1

## 2018-10-18 MED ORDER — METOCLOPRAMIDE HCL 5 MG/ML IJ SOLN: 5.0000 mg | Freq: Three times a day (TID) | INTRAMUSCULAR | Status: DC | PRN

## 2018-10-18 MED ORDER — SERTRALINE HCL 25 MG PO TABS
25.0000 mg | ORAL_TABLET | Freq: Every day | ORAL | Status: DC
  Administered 2018-10-18 – 2018-10-19 (×2): 25 mg via ORAL
  Filled 2018-10-18 (×2): qty 1

## 2018-10-18 MED ORDER — FENTANYL CITRATE (PF) 100 MCG/2ML IJ SOLN
25.0000 ug | INTRAMUSCULAR | Status: DC | PRN
  Administered 2018-10-18: 50 ug via INTRAVENOUS

## 2018-10-18 MED ORDER — FENTANYL CITRATE (PF) 100 MCG/2ML IJ SOLN
INTRAMUSCULAR | Status: AC
  Filled 2018-10-18: qty 2

## 2018-10-18 MED ORDER — BUPIVACAINE LIPOSOME 1.3 % IJ SUSP
INTRAMUSCULAR | Status: DC | PRN
  Administered 2018-10-18: 20 mL

## 2018-10-18 MED ORDER — OXYCODONE HCL 5 MG PO TABS: 5.0000 mg | ORAL_TABLET | ORAL | Status: DC | PRN

## 2018-10-18 MED ORDER — ONDANSETRON HCL 4 MG/2ML IJ SOLN
INTRAMUSCULAR | Status: DC | PRN
  Administered 2018-10-18: 4 mg via INTRAVENOUS

## 2018-10-18 MED ORDER — ONDANSETRON HCL 4 MG PO TABS: 4.0000 mg | ORAL_TABLET | Freq: Four times a day (QID) | ORAL | Status: DC | PRN

## 2018-10-18 MED ORDER — ROCURONIUM BROMIDE 10 MG/ML (PF) SYRINGE
PREFILLED_SYRINGE | INTRAVENOUS | Status: DC | PRN
  Administered 2018-10-18: 50 mg via INTRAVENOUS

## 2018-10-18 MED ORDER — POLYETHYLENE GLYCOL 3350 17 G PO PACK: 17.0000 g | PACK | Freq: Every day | ORAL | Status: DC | PRN

## 2018-10-18 MED ORDER — ATORVASTATIN CALCIUM 20 MG PO TABS
20.0000 mg | ORAL_TABLET | Freq: Every day | ORAL | Status: DC
  Administered 2018-10-19 – 2018-10-20 (×2): 20 mg via ORAL
  Filled 2018-10-18 (×2): qty 1

## 2018-10-18 MED ORDER — MIDAZOLAM HCL 2 MG/2ML IJ SOLN
INTRAMUSCULAR | Status: AC
  Filled 2018-10-18: qty 2

## 2018-10-18 MED ORDER — ROCURONIUM BROMIDE 10 MG/ML (PF) SYRINGE
PREFILLED_SYRINGE | INTRAVENOUS | Status: AC
  Filled 2018-10-18: qty 10

## 2018-10-18 MED ORDER — TRANEXAMIC ACID-NACL 1000-0.7 MG/100ML-% IV SOLN
1000.0000 mg | Freq: Once | INTRAVENOUS | Status: AC
  Administered 2018-10-18: 1000 mg via INTRAVENOUS
  Filled 2018-10-18: qty 100

## 2018-10-18 MED ORDER — EPHEDRINE SULFATE-NACL 50-0.9 MG/10ML-% IV SOSY
PREFILLED_SYRINGE | INTRAVENOUS | Status: DC | PRN
  Administered 2018-10-18: 15 mg via INTRAVENOUS

## 2018-10-18 MED ORDER — OXYCODONE HCL 5 MG PO TABS: 5.0000 mg | ORAL_TABLET | Freq: Once | ORAL | Status: DC | PRN

## 2018-10-18 SURGICAL SUPPLY — 61 items
ATTUNE MED DOME PAT 41 KNEE (Knees) ×2 IMPLANT
ATTUNE MED DOME PAT 41MM KNEE (Knees) ×1 IMPLANT
ATTUNE PS FEM RT SZ 8 CEM KNEE (Femur) ×3 IMPLANT
ATTUNE PSRP INSR SZ8 12 KNEE (Insert) ×2 IMPLANT
ATTUNE PSRP INSR SZ8 12MMKNEE (Insert) ×1 IMPLANT
BAG ZIPLOCK 12X15 (MISCELLANEOUS) ×3 IMPLANT
BANDAGE ACE 6X5 VEL STRL LF (GAUZE/BANDAGES/DRESSINGS) ×3 IMPLANT
BANDAGE ELASTIC 6 VELCRO ST LF (GAUZE/BANDAGES/DRESSINGS) ×3 IMPLANT
BASE TIBIAL ATTUNE KNEE SZ9 (Knees) ×1 IMPLANT
BLADE SAG 18X100X1.27 (BLADE) ×3 IMPLANT
BLADE SAW SGTL 11.0X1.19X90.0M (BLADE) ×3 IMPLANT
BLADE SURG SZ10 CARB STEEL (BLADE) ×6 IMPLANT
BOWL SMART MIX CTS (DISPOSABLE) ×3 IMPLANT
CEMENT HV SMART SET (Cement) ×6 IMPLANT
CLOSURE WOUND 1/2 X4 (GAUZE/BANDAGES/DRESSINGS) ×2
COVER SURGICAL LIGHT HANDLE (MISCELLANEOUS) ×3 IMPLANT
COVER WAND RF STERILE (DRAPES) IMPLANT
CUFF TOURN SGL QUICK 34 (TOURNIQUET CUFF) ×2
CUFF TRNQT CYL 34X4X40X1 (TOURNIQUET CUFF) ×1 IMPLANT
DECANTER SPIKE VIAL GLASS SM (MISCELLANEOUS) ×3 IMPLANT
DRAPE U-SHAPE 47X51 STRL (DRAPES) ×3 IMPLANT
DRSG ADAPTIC 3X8 NADH LF (GAUZE/BANDAGES/DRESSINGS) ×3 IMPLANT
DRSG PAD ABDOMINAL 8X10 ST (GAUZE/BANDAGES/DRESSINGS) ×3 IMPLANT
DURAPREP 26ML APPLICATOR (WOUND CARE) ×3 IMPLANT
ELECT REM PT RETURN 15FT ADLT (MISCELLANEOUS) ×3 IMPLANT
EVACUATOR 1/8 PVC DRAIN (DRAIN) ×3 IMPLANT
GAUZE SPONGE 4X4 12PLY STRL (GAUZE/BANDAGES/DRESSINGS) ×3 IMPLANT
GLOVE BIO SURGEON STRL SZ7 (GLOVE) ×3 IMPLANT
GLOVE BIO SURGEON STRL SZ8 (GLOVE) ×3 IMPLANT
GLOVE BIOGEL PI IND STRL 6.5 (GLOVE) ×1 IMPLANT
GLOVE BIOGEL PI IND STRL 7.0 (GLOVE) ×1 IMPLANT
GLOVE BIOGEL PI IND STRL 8 (GLOVE) ×1 IMPLANT
GLOVE BIOGEL PI INDICATOR 6.5 (GLOVE) ×2
GLOVE BIOGEL PI INDICATOR 7.0 (GLOVE) ×2
GLOVE BIOGEL PI INDICATOR 8 (GLOVE) ×2
GLOVE SURG SS PI 6.5 STRL IVOR (GLOVE) ×3 IMPLANT
GOWN STRL REUS W/TWL LRG LVL3 (GOWN DISPOSABLE) ×6 IMPLANT
GOWN STRL REUS W/TWL XL LVL3 (GOWN DISPOSABLE) ×3 IMPLANT
HANDPIECE INTERPULSE COAX TIP (DISPOSABLE) ×2
HOLDER FOLEY CATH W/STRAP (MISCELLANEOUS) IMPLANT
IMMOBILIZER KNEE 20 (SOFTGOODS) ×6 IMPLANT
IMMOBILIZER KNEE 20 THIGH 36 (SOFTGOODS) ×1 IMPLANT
MANIFOLD NEPTUNE II (INSTRUMENTS) ×3 IMPLANT
NS IRRIG 1000ML POUR BTL (IV SOLUTION) ×3 IMPLANT
PACK TOTAL KNEE CUSTOM (KITS) ×3 IMPLANT
PAD ABD 8X10 STRL (GAUZE/BANDAGES/DRESSINGS) ×3 IMPLANT
PADDING CAST COTTON 6X4 STRL (CAST SUPPLIES) ×6 IMPLANT
PIN STEINMAN FIXATION KNEE (PIN) ×3 IMPLANT
PROTECTOR NERVE ULNAR (MISCELLANEOUS) ×3 IMPLANT
SET HNDPC FAN SPRY TIP SCT (DISPOSABLE) ×1 IMPLANT
STRIP CLOSURE SKIN 1/2X4 (GAUZE/BANDAGES/DRESSINGS) ×4 IMPLANT
SUT MNCRL AB 4-0 PS2 18 (SUTURE) ×3 IMPLANT
SUT STRATAFIX 0 PDS 27 VIOLET (SUTURE) ×3
SUT VIC AB 2-0 CT1 27 (SUTURE) ×6
SUT VIC AB 2-0 CT1 TAPERPNT 27 (SUTURE) ×3 IMPLANT
SUTURE STRATFX 0 PDS 27 VIOLET (SUTURE) ×1 IMPLANT
TIBIAL BASE ATTUNE KNEE SZ9 (Knees) ×3 IMPLANT
TRAY FOLEY MTR SLVR 16FR STAT (SET/KITS/TRAYS/PACK) ×3 IMPLANT
WATER STERILE IRR 1000ML POUR (IV SOLUTION) ×6 IMPLANT
WRAP KNEE MAXI GEL POST OP (GAUZE/BANDAGES/DRESSINGS) ×3 IMPLANT
YANKAUER SUCT BULB TIP 10FT TU (MISCELLANEOUS) ×3 IMPLANT

## 2018-10-18 NOTE — Transfer of Care (Signed)
Immediate Anesthesia Transfer of Care Note  Patient: Drew Hudson  Procedure(s) Performed: RIGHT TOTAL KNEE ARTHROPLASTY (Right Knee)  Patient Location: PACU  Anesthesia Type:General  Level of Consciousness: sedated  Airway & Oxygen Therapy: Patient Spontanous Breathing and Patient connected to face mask oxygen  Post-op Assessment: Report given to RN and Post -op Vital signs reviewed and stable  Post vital signs: Reviewed and stable  Last Vitals:  Vitals Value Taken Time  BP 157/82 10/18/2018  1:32 PM  Temp    Pulse 54 10/18/2018  1:34 PM  Resp 12 10/18/2018  1:34 PM  SpO2 97 % 10/18/2018  1:34 PM  Vitals shown include unvalidated device data.  Last Pain:  Vitals:   10/18/18 1142  TempSrc:   PainSc: 0-No pain      Patients Stated Pain Goal: 5 (49/17/91 [id number])  Complications: No apparent anesthesia complications

## 2018-10-18 NOTE — Op Note (Signed)
OPERATIVE REPORT-TOTAL KNEE ARTHROPLASTY   Pre-operative diagnosis- Osteoarthritis  Right knee(s)  Post-operative diagnosis- Osteoarthritis Right knee(s)  Procedure-  Right  Total Knee Arthroplasty (Depuy Attune)  Surgeon- Dione Plover. Tylan Kinn, MD  Assistant- Griffith Citron, PA-C   Anesthesia-  Adductor canal block and general  EBL- 25 ml   Drains Hemovac  Tourniquet time- 38 minutes @ 893 mm HG  Complications- None  Condition-PACU - hemodynamically stable.   Brief Clinical Note  Drew Hudson is a 82 y.o. year old male with end stage OA of his right knee with progressively worsening pain and dysfunction. He has constant pain, with activity and at rest and significant functional deficits with difficulties even with ADLs. He has had extensive non-op management including analgesics, injections of cortisone and viscosupplements, and home exercise program, but remains in significant pain with significant dysfunction. Radiographs show bone on bone arthritis medial and patellofemoral. He presents now for right Total Knee Arthroplasty.    Procedure in detail---   The patient is brought into the operating room and positioned supine on the operating table. After successful administration of  GA combined with regional for post-op pain,   a tourniquet is placed high on the  Right thigh(s) and the lower extremity is prepped and draped in the usual sterile fashion. Time out is performed by the operating team and then the  Right lower extremity is wrapped in Esmarch, knee flexed and the tourniquet inflated to 300 mmHg.       A midline incision is made with a ten blade through the subcutaneous tissue to the level of the extensor mechanism. A fresh blade is used to make a medial parapatellar arthrotomy. Soft tissue over the proximal medial tibia is subperiosteally elevated to the joint line with a knife and into the semimembranosus bursa with a Cobb elevator. Soft tissue over the proximal lateral  tibia is elevated with attention being paid to avoiding the patellar tendon on the tibial tubercle. The patella is everted, knee flexed 90 degrees and the ACL and PCL are removed. Findings are bone on bone medial and patellofemoral with massive global osteophytes.        The drill is used to create a starting hole in the distal femur and the canal is thoroughly irrigated with sterile saline to remove the fatty contents. The 5 degree Right  valgus alignment guide is placed into the femoral canal and the distal femoral cutting block is pinned to remove 9 mm off the distal femur. Resection is made with an oscillating saw.      The tibia is subluxed forward and the menisci are removed. The extramedullary alignment guide is placed referencing proximally at the medial aspect of the tibial tubercle and distally along the second metatarsal axis and tibial crest. The block is pinned to remove 59mm off the more deficient medial  side. Resection is made with an oscillating saw. Size 9is the most appropriate size for the tibia and the proximal tibia is prepared with the modular drill and keel punch for that size.      The femoral sizing guide is placed and size 8 is most appropriate. Rotation is marked off the epicondylar axis and confirmed by creating a rectangular flexion gap at 90 degrees. The size 8 cutting block is pinned in this rotation and the anterior, posterior and chamfer cuts are made with the oscillating saw. The intercondylar block is then placed and that cut is made.      Trial size 9 tibial component,  trial size 8 posterior stabilized femur and a 12  mm posterior stabilized rotating platform insert trial is placed. Full extension is achieved with excellent varus/valgus and anterior/posterior balance throughout full range of motion. The patella is everted and thickness measured to be 27  mm. Free hand resection is taken to 15 mm, a 41 template is placed, lug holes are drilled, trial patella is placed, and it  tracks normally. Osteophytes are removed off the posterior femur with the trial in place. All trials are removed and the cut bone surfaces prepared with pulsatile lavage. Cement is mixed and once ready for implantation, the size 9 tibial implant, size  8 posterior stabilized femoral component, and the size 41 patella are cemented in place and the patella is held with the clamp. The trial insert is placed and the knee held in full extension. The Exparel (20 ml mixed with 60 ml saline) is injected into the extensor mechanism, posterior capsule, medial and lateral gutters and subcutaneous tissues.  All extruded cement is removed and once the cement is hard the permanent 12 mm posterior stabilized rotating platform insert is placed into the tibial tray.      The wound is copiously irrigated with saline solution and the extensor mechanism closed over a hemovac drain with #1 V-loc suture. The tourniquet is released for a total tourniquet time of 38  minutes. Flexion against gravity is 140 degrees and the patella tracks normally. Subcutaneous tissue is closed with 2.0 vicryl and subcuticular with running 4.0 Monocryl. The incision is cleaned and dried and steri-strips and a bulky sterile dressing are applied. The limb is placed into a knee immobilizer and the patient is awakened and transported to recovery in stable condition.      Please note that a surgical assistant was a medical necessity for this procedure in order to perform it in a safe and expeditious manner. Surgical assistant was necessary to retract the ligaments and vital neurovascular structures to prevent injury to them and also necessary for proper positioning of the limb to allow for anatomic placement of the prosthesis.   Dione Plover Keenan Dimitrov, MD    10/18/2018, 1:03 PM

## 2018-10-18 NOTE — Care Plan (Signed)
Ortho Bundle Case Management Note  Patient Details  Name: Drew Hudson MRN: [id number] Date of Birth: 1936-09-17  R TKA scheduled 10-18-18 DCP:  Home with dtr.  Lives in a 1 story home with 3 ste.  DME:  RW and 3-in-1 ordered through [place] [facility]:  [facility] in [place].  Eval scheduled on 10-22-18.                   DME Arranged:  3-N-1, Walker rolling DME Agency:  [employer]  HH Arranged:  NA HH Agency:  NA  Additional Comments: Please contact me with any questions of if this plan should need to change.  Marianne Sofia, RN,CCM [facility]  [phone] 10/18/2018, 11:44 AM

## 2018-10-18 NOTE — Anesthesia Procedure Notes (Signed)
Procedure Name: Intubation Date/Time: 10/18/2018 12:03 PM Performed by: Lind Covert, CRNA Pre-anesthesia Checklist: Patient identified, Emergency Drugs available, Suction available and Patient being monitored Patient Re-evaluated:Patient Re-evaluated prior to induction Oxygen Delivery Method: Circle system utilized Preoxygenation: Pre-oxygenation with 100% oxygen Induction Type: IV induction Ventilation: Mask ventilation without difficulty and Oral airway inserted - appropriate to patient size Laryngoscope Size: Sabra Heck and 2 Grade View: Grade I Tube type: Oral Tube size: 7.5 mm Number of attempts: 1 Airway Equipment and Method: Stylet and Oral airway Placement Confirmation: ETT inserted through vocal cords under direct vision,  positive ETCO2 and breath sounds checked- equal and bilateral Secured at: 23 cm Tube secured with: Tape Dental Injury: Teeth and Oropharynx as per pre-operative assessment

## 2018-10-18 NOTE — Anesthesia Procedure Notes (Signed)
Anesthesia Regional Block: Adductor canal block   Pre-Anesthetic Checklist: ,, timeout performed, Correct Patient, Correct Site, Correct Laterality, Correct Procedure, Correct Position, site marked, Risks and benefits discussed,  Surgical consent,  Pre-op evaluation,  At surgeon's request and post-op pain management  Laterality: Right  Prep: chloraprep       Needles:  Injection technique: Single-shot  Needle Type: Echogenic Needle     Needle Length: 10cm  Needle Gauge: 21     Additional Needles:   Narrative:  Start time: 10/18/2018 11:17 AM End time: 10/18/2018 11:20 AM Injection made incrementally with aspirations every 5 mL.  Performed by: Personally  Anesthesiologist: Audry Pili, MD  Additional Notes: No pain on injection. No increased resistance to injection. Injection made in 5cc increments. Good needle visualization. Patient tolerated the procedure well.

## 2018-10-18 NOTE — Plan of Care (Signed)

## 2018-10-18 NOTE — Discharge Instructions (Signed)
° °Dr. Frank Aluisio °Total Joint Specialist °[facility] °[address] °[place], [place] [address] °[phone] ° °TOTAL KNEE REPLACEMENT POSTOPERATIVE DIRECTIONS ° °Knee Rehabilitation, Guidelines Following Surgery  °Results after knee surgery are often greatly improved when you follow the exercise, range of motion and muscle strengthening exercises prescribed by your doctor. Safety measures are also important to protect the knee from further injury. Any time any of these exercises cause you to have increased pain or swelling in your knee joint, decrease the amount until you are comfortable again and slowly increase them. If you have problems or questions, call your caregiver or physical therapist for advice.  ° °HOME CARE INSTRUCTIONS  °• Remove items at home which could result in a fall. This includes throw rugs or furniture in walking pathways.  °· ICE to the affected knee every three hours for 30 minutes at a time and then as needed for pain and swelling.  Continue to use ice on the knee for pain and swelling from surgery. You may notice swelling that will progress down to the foot and ankle.  This is normal after surgery.  Elevate the leg when you are not up walking on it.   °· Continue to use the breathing machine which will help keep your temperature down.  It is common for your temperature to cycle up and down following surgery, especially at night when you are not up moving around and exerting yourself.  The breathing machine keeps your lungs expanded and your temperature down. °· Do not place pillow under knee, focus on keeping the knee straight while resting ° °DIET °You may resume your previous home diet once your are discharged from the hospital. ° °DRESSING / WOUND CARE / SHOWERING °You may shower 3 days after surgery, but keep the wounds dry during showering.  You may use an occlusive plastic wrap (Press'n Seal for example), NO SOAKING/SUBMERGING IN THE BATHTUB.  If the bandage  gets wet, change with a clean dry gauze.  If the incision gets wet, pat the wound dry with a clean towel. °You may start showering once you are discharged home but do not submerge the incision under water. Just pat the incision dry and apply a dry gauze dressing on daily. °Change the surgical dressing daily and reapply a dry dressing each time. ° °ACTIVITY °Walk with your walker as instructed. °Use walker as long as suggested by your caregivers. °Avoid periods of inactivity such as sitting longer than an hour when not asleep. This helps prevent blood clots.  °You may resume a sexual relationship in one month or when given the OK by your doctor.  °You may return to work once you are cleared by your doctor.  °Do not drive a car for 6 weeks or until released by you surgeon.  °Do not drive while taking narcotics. ° °WEIGHT BEARING °Weight bearing as tolerated with assist device (walker, cane, etc) as directed, use it as long as suggested by your surgeon or therapist, typically at least 4-6 weeks. ° °POSTOPERATIVE CONSTIPATION PROTOCOL °Constipation - defined medically as fewer than three stools per week and severe constipation as less than one stool per week. ° °One of the most common issues patients have following surgery is constipation.  Even if you have a regular bowel pattern at home, your normal regimen is likely to be disrupted due to multiple reasons following surgery.  Combination of anesthesia, postoperative narcotics, change in appetite and fluid intake all can affect your bowels.    In order to avoid complications following surgery, here are some recommendations in order to help you during your recovery period. ° °Colace (docusate) - Pick up an over-the-counter form of Colace or another stool softener and take twice a day as long as you are requiring postoperative pain medications.  Take with a full glass of water daily.  If you experience loose stools or diarrhea, hold the colace until you stool forms back  up.  If your symptoms do not get better within 1 week or if they get worse, check with your doctor. ° °Dulcolax (bisacodyl) - Pick up over-the-counter and take as directed by the product packaging as needed to assist with the movement of your bowels.  Take with a full glass of water.  Use this product as needed if not relieved by Colace only.  ° °MiraLax (polyethylene glycol) - Pick up over-the-counter to have on hand.  MiraLax is a solution that will increase the amount of water in your bowels to assist with bowel movements.  Take as directed and can mix with a glass of water, juice, soda, coffee, or tea.  Take if you go more than two days without a movement. °Do not use MiraLax more than once per day. Call your doctor if you are still constipated or irregular after using this medication for 7 days in a row. ° °If you continue to have problems with postoperative constipation, please contact the office for further assistance and recommendations.  If you experience "the worst abdominal pain ever" or develop nausea or vomiting, please contact the office immediatly for further recommendations for treatment. ° °ITCHING ° If you experience itching with your medications, try taking only a single pain pill, or even half a pain pill at a time.  You can also use Benadryl over the counter for itching or also to help with sleep.  ° °TED HOSE STOCKINGS °Wear the elastic stockings on both legs for three weeks following surgery during the day but you may remove then at night for sleeping. ° °MEDICATIONS °See your medication summary on the “After Visit Summary” that the nursing staff will review with you prior to discharge.  You may have some home medications which will be placed on hold until you complete the course of blood thinner medication.  It is important for you to complete the blood thinner medication as prescribed by your surgeon.  Continue your approved medications as instructed at time of discharge. ° °PRECAUTIONS °If  you experience chest pain or shortness of breath - call 911 immediately for transfer to the hospital emergency department.  °If you develop a fever greater that 101 F, purulent drainage from wound, increased redness or drainage from wound, foul odor from the wound/dressing, or calf pain - CONTACT YOUR SURGEON.   °                                                °FOLLOW-UP APPOINTMENTS °Make sure you keep all of your appointments after your operation with your surgeon and caregivers. You should call the office at the above phone number and make an appointment for approximately two weeks after the date of your surgery or on the date instructed by your surgeon outlined in the "After Visit Summary". ° ° °RANGE OF MOTION AND STRENGTHENING EXERCISES  °Rehabilitation of the knee is important following a knee injury or   an operation. After just a few days of immobilization, the muscles of the thigh which control the knee become weakened and shrink (atrophy). Knee exercises are designed to build up the tone and strength of the thigh muscles and to improve knee motion. Often times heat used for twenty to thirty minutes before working out will loosen up your tissues and help with improving the range of motion but do not use heat for the first two weeks following surgery. These exercises can be done on a training (exercise) mat, on the floor, on a table or on a bed. Use what ever works the best and is most comfortable for you Knee exercises include:  °• Leg Lifts - While your knee is still immobilized in a splint or cast, you can do straight leg raises. Lift the leg to 60 degrees, hold for 3 sec, and slowly lower the leg. Repeat 10-20 times 2-3 times daily. Perform this exercise against resistance later as your knee gets better.  °• Quad and Hamstring Sets - Tighten up the muscle on the front of the thigh (Quad) and hold for 5-10 sec. Repeat this 10-20 times hourly. Hamstring sets are done by pushing the foot backward against an  object and holding for 5-10 sec. Repeat as with quad sets.  °· Leg Slides: Lying on your back, slowly slide your foot toward your buttocks, bending your knee up off the floor (only go as far as is comfortable). Then slowly slide your foot back down until your leg is flat on the floor again. °· Angel Wings: Lying on your back spread your legs to the side as far apart as you can without causing discomfort.  °A rehabilitation program following serious knee injuries can speed recovery and prevent re-injury in the future due to weakened muscles. Contact your doctor or a physical therapist for more information on knee rehabilitation.  ° °IF YOU ARE TRANSFERRED TO A SKILLED REHAB FACILITY °If the patient is transferred to a skilled rehab facility following release from the hospital, a list of the current medications will be sent to the facility for the patient to continue.  When discharged from the skilled rehab facility, please have the facility set up the patient's Home Health Physical Therapy prior to being released. Also, the skilled facility will be responsible for providing the patient with their medications at time of release from the facility to include their pain medication, the muscle relaxants, and their blood thinner medication. If the patient is still at the rehab facility at time of the two week follow up appointment, the skilled rehab facility will also need to assist the patient in arranging follow up appointment in our office and any transportation needs. ° °MAKE SURE YOU:  °• Understand these instructions.  °• Get help right away if you are not doing well or get worse.  ° ° °Pick up stool softner and laxative for home use following surgery while on pain medications. °Do not submerge incision under water. °Please use good hand washing techniques while changing dressing each day. °May shower starting three days after surgery. °Please use a clean towel to pat the incision dry following showers. °Continue to  use ice for pain and swelling after surgery. °Do not use any lotions or creams on the incision until instructed by your surgeon. ° °

## 2018-10-18 NOTE — Progress Notes (Signed)
Assisted Dr. Brock with right, ultrasound guided, adductor canal block. Side rails up, monitors on throughout procedure. See vital signs in flow sheet. Tolerated Procedure well.  

## 2018-10-18 NOTE — Anesthesia Postprocedure Evaluation (Signed)
Anesthesia Post Note  Patient: Drew Hudson  Procedure(s) Performed: RIGHT TOTAL KNEE ARTHROPLASTY (Right Knee)     Patient location during evaluation: PACU Anesthesia Type: General Level of consciousness: awake and alert Pain management: pain level controlled Vital Signs Assessment: post-procedure vital signs reviewed and stable Respiratory status: spontaneous breathing, nonlabored ventilation and respiratory function stable Cardiovascular status: blood pressure returned to baseline and stable Postop Assessment: no apparent nausea or vomiting Anesthetic complications: no    Last Vitals:  Vitals:   10/18/18 1430 10/18/18 1445  BP: (!) 162/86 (!) 158/75  Pulse: (!) 58 (!) 58  Resp: 18 (!) 22  Temp:    SpO2: 98% 97%    Last Pain:  Vitals:   10/18/18 1430  TempSrc:   PainSc: [place]

## 2018-10-18 NOTE — Evaluation (Signed)
Physical Therapy Evaluation Patient Details Name: Drew Hudson MRN: [id number] DOB: 01/11/1936 Today's Date: 10/18/2018   History of Present Illness  82 yo male s/p R TKR on 10/18/18. PMH includes OA, cataracts, CKDIII, DMII, edema, GERD, glaucoma, headaches, HLD, HTN, peripheral neuropathy, insomnia, OSA, prostate cancer s/p prostatectomy, HOH.   Clinical Impression   Pt presents with R knee pain, decreased R knee ROM, difficulty performing bed mobility, and decreased activity tolerance due to R knee pain. Pt to benefit from acute PT to address deficits. Pt ambulated 20 ft with min guard assist, verbal cuing provided throughout. Pt needed to sit after 20 ft ambulation due to increasing pain.  Pt educated on quad sets (5-10/hour), ankle pumps (20/hour), and heel slides (5-10/hour) to perform this afternoon/evening to lessen stiffness and increase circulation, to pt's tolerance and limited by pain. PT to progress mobility as tolerated, and will continue to follow acutely.      Follow Up Recommendations Follow surgeon's recommendation for DC plan and follow-up therapies;Supervision for mobility/OOB(OPPT)    Equipment Recommendations  Rolling walker with 5" wheels    Recommendations for Other Services       Precautions / Restrictions Precautions Precautions: Fall Required Braces or Orthoses: Knee Immobilizer - Right Knee Immobilizer - Right: On when out of bed or walking;Discontinue once straight leg raise with < 10 degree lag Restrictions Weight Bearing Restrictions: No Other Position/Activity Restrictions: WBAT       Mobility  Bed Mobility Overal bed mobility: Needs Assistance Bed Mobility: Supine to Sit     Supine to sit: Min assist;HOB elevated     General bed mobility comments: Min assist for RLE management. Multiple verbal cues provided for sequencing, squaring hips at EOB.   Transfers Overall transfer level: Needs assistance Equipment used: Rolling walker (2  wheeled) Transfers: Sit to/from Stand Sit to Stand: Min guard;From elevated surface         General transfer comment: Min guard for safety. Verbal cuing for hand placement. Pt with self-steadying upon standing.  Ambulation/Gait Ambulation/Gait assistance: Min guard;+2 safety/equipment(chair follow) Gait Distance (Feet): 20 Feet Assistive device: Rolling walker (2 wheeled) Gait Pattern/deviations: Step-to pattern;Decreased weight shift to right;Antalgic Gait velocity: decr    General Gait Details: Min guard for safety. Verbal cuing for sequencing, foot and body placement in RW. Pt with increasingly antalgic gait with further ambulation distance, pt sat in chair follow recliner after 20 ft.   Stairs            Wheelchair Mobility    Modified Rankin (Stroke Patients Only)       Balance Overall balance assessment: Mild deficits observed, not formally tested                                           Pertinent Vitals/Pain Pain Assessment: 0-10 Pain Score: 7  Pain Location: R knee  Pain Descriptors / Indicators: Sore Pain Intervention(s): Limited activity within patient's tolerance;Repositioned;Ice applied;Monitored during session    Hunter expects to be discharged to:: Private residence Living Arrangements: Alone Available Help at Discharge: Family;Available PRN/intermittently(daughter lives closeby, daughter will stay with pt as needed) Type of Home: House Home Access: Stairs to enter Entrance Stairs-Rails: Left;Right;Can reach both(on the front steps only ) Entrance Stairs-Number of Steps: 1 in the garage, 3 in the front  Home Layout: Two level;Able to live on main level with bedroom/bathroom Home  Equipment: Kasandra Knudsen - single point      Prior Function Level of Independence: Independent with assistive device(s)         Comments: used cane as needed      Hand Dominance   Dominant Hand: Left    Extremity/Trunk Assessment    Upper Extremity Assessment Upper Extremity Assessment: Overall WFL for tasks assessed    Lower Extremity Assessment Lower Extremity Assessment: Overall WFL for tasks assessed;RLE deficits/detail RLE Deficits / Details: suspected post-surgical weakness; able to perform quad set, SLR with KI donned, ankle pumps  RLE Sensation: WNL    Cervical / Trunk Assessment Cervical / Trunk Assessment: Normal  Communication   Communication: No difficulties  Cognition Arousal/Alertness: Awake/alert Behavior During Therapy: WFL for tasks assessed/performed Overall Cognitive Status: Within Functional Limits for tasks assessed                                        General Comments      Exercises Total Joint Exercises Goniometric ROM: knee aarom ~10-50*, limited by pain    Assessment/Plan    PT Assessment Patient needs continued PT services  PT Problem List Decreased strength;Pain;Decreased range of motion;Decreased activity tolerance;Decreased knowledge of use of DME;Decreased balance;Decreased safety awareness;Decreased mobility       PT Treatment Interventions DME instruction;Therapeutic activities;Therapeutic exercise;Patient/family education;Stair training;Balance training;Gait training;Functional mobility training    PT Goals (Current goals can be found in the Care Plan section)  Acute Rehab PT Goals PT Goal Formulation: With patient Time For Goal Achievement: 10/25/18 Potential to Achieve Goals: Good    Frequency 7X/week   Barriers to discharge        Co-evaluation               AM-PAC PT "6 Clicks" Mobility  Outcome Measure Help needed turning from your back to your side while in a flat bed without using bedrails?: A Little Help needed moving from lying on your back to sitting on the side of a flat bed without using bedrails?: A Little Help needed moving to and from a bed to a chair (including a wheelchair)?: A Little Help needed standing up from a  chair using your arms (e.g., wheelchair or bedside chair)?: A Little Help needed to walk in hospital room?: A Little Help needed climbing 3-5 steps with a railing? : A Little 6 Click Score: 18    End of Session Equipment Utilized During Treatment: Gait belt;Right knee immobilizer Activity Tolerance: Patient tolerated treatment well;Patient limited by pain Patient left: with chair alarm set;in chair;with call bell/phone within reach;with family/visitor present;with SCD's reapplied Nurse Communication: Mobility status PT Visit Diagnosis: Other abnormalities of gait and mobility (R26.89);Difficulty in walking, not elsewhere classified (R26.2)    Time: 1700-1726 PT Time Calculation (min) (ACUTE ONLY): 26 min   Charges:   PT Evaluation $PT Eval Low Complexity: 1 Low PT Treatments $Gait Training: 8-22 mins        Julien Girt, PT Acute Rehabilitation Services Pager [phone]  Office [phone]   Jacobs Golab D Elonda Husky 10/18/2018, 7:41 PM

## 2018-10-18 NOTE — Anesthesia Preprocedure Evaluation (Addendum)
Anesthesia Evaluation  Patient identified by MRN, date of birth, ID band Patient awake    Reviewed: Allergy & Precautions, NPO status , Patient's Chart, lab work & pertinent test results  History of Anesthesia Complications Negative for: history of anesthetic complications  Airway Mallampati: II  TM Distance: >3 FB Neck ROM: Full    Dental  (+) Dental Advisory Given, Partial Lower, Partial Upper   Pulmonary sleep apnea and Continuous Positive Airway Pressure Ventilation , former smoker,    breath sounds clear to auscultation       Cardiovascular hypertension, Pt. on medications  Rhythm:Regular Rate:Normal     Neuro/Psych  Headaches,  B/l hearing aids   Neuromuscular disease (peripheral neuropathy) negative psych ROS   GI/Hepatic Neg liver ROS, GERD  Medicated and Controlled, Fecal incontinence    Endo/Other  diabetes, Well Controlled Obesity   Renal/GU CRFRenal disease    Prostate cancer s/p prostatectomy     Musculoskeletal  (+) Arthritis , Osteoarthritis,    Abdominal   Peds  Hematology  PLT 149k   Anesthesia Other Findings   Reproductive/Obstetrics                            Anesthesia Physical Anesthesia Plan  ASA: III  Anesthesia Plan: General   Post-op Pain Management:  Regional for Post-op pain   Induction: Intravenous  PONV Risk Score and Plan: 2 and Treatment may vary due to age or medical condition, Ondansetron and Propofol infusion  Airway Management Planned: Oral ETT  Additional Equipment: None  Intra-op Plan:   Post-operative Plan: Extubation in OR  Informed Consent: I have reviewed the patients History and Physical, chart, labs and discussed the procedure including the risks, benefits and alternatives for the proposed anesthesia with the patient or authorized representative who has indicated his/her understanding and acceptance.   Dental advisory  given  Plan Discussed with: CRNA and Anesthesiologist  Anesthesia Plan Comments: (Discussed spinal with patient. 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 Patient requests we bypass the spinal option and proceed with general anesthesia.)       Anesthesia Quick Evaluation

## 2018-10-19 ENCOUNTER — Encounter (HOSPITAL_COMMUNITY): Payer: Self-pay | Admitting: Orthopedic Surgery

## 2018-10-19 DIAGNOSIS — M1711 Unilateral primary osteoarthritis, right knee: Secondary | ICD-10-CM | POA: Diagnosis not present

## 2018-10-19 LAB — BASIC METABOLIC PANEL
Anion gap: 8 (ref 5–15)
BUN: 18 mg/dL (ref 8–23)
CO2: 21 mmol/L — ABNORMAL LOW (ref 22–32)
Calcium: 8.2 mg/dL — ABNORMAL LOW (ref 8.9–10.3)
Chloride: 109 mmol/L (ref 98–111)
Creatinine, Ser: 1.35 mg/dL — ABNORMAL HIGH (ref 0.61–1.24)
GFR calc Af Amer: 57 mL/min — ABNORMAL LOW (ref >60)
GFR calc non Af Amer: 49 mL/min — ABNORMAL LOW (ref >60)
Glucose, Bld: 136 mg/dL — ABNORMAL HIGH (ref 70–99)
Potassium: 4.1 mmol/L (ref 3.5–5.1)
Sodium: 138 mmol/L (ref 135–145)

## 2018-10-19 LAB — GLUCOSE, CAPILLARY
GLUCOSE-CAPILLARY: 135 mg/dL — AB (ref 70–99)
Glucose-Capillary: 97 mg/dL (ref 70–99)
Glucose-Capillary: 170 mg/dL — ABNORMAL HIGH (ref 70–99)
Glucose-Capillary: 174 mg/dL — ABNORMAL HIGH (ref 70–99)

## 2018-10-19 LAB — CBC
HCT: 36.6 % — ABNORMAL LOW (ref 39.0–52.0)
Hemoglobin: 11.7 g/dL — ABNORMAL LOW (ref 13.0–17.0)
MCH: 27.5 pg (ref 26.0–34.0)
MCHC: 32 g/dL (ref 30.0–36.0)
MCV: 85.9 fL (ref 80.0–100.0)
NRBC: 0 % (ref 0.0–0.2)
Platelets: 129 10*3/uL — ABNORMAL LOW (ref 150–400)
RBC: 4.26 MIL/uL (ref 4.22–5.81)
RDW: 13.5 % (ref 11.5–15.5)
WBC: 7.2 10*3/uL (ref 4.0–10.5)

## 2018-10-19 MED ORDER — TRAMADOL HCL 50 MG PO TABS: 50.0000 mg | ORAL_TABLET | Freq: Four times a day (QID) | ORAL | 0 refills | Status: DC | PRN

## 2018-10-19 MED ORDER — METHOCARBAMOL 500 MG PO TABS: 500.0000 mg | ORAL_TABLET | Freq: Four times a day (QID) | ORAL | 0 refills | Status: DC | PRN

## 2018-10-19 MED ORDER — OXYCODONE HCL 5 MG PO TABS: 5.0000 mg | ORAL_TABLET | Freq: Four times a day (QID) | ORAL | 0 refills | Status: DC | PRN

## 2018-10-19 MED ORDER — ASPIRIN 325 MG PO TBEC: 325.0000 mg | DELAYED_RELEASE_TABLET | Freq: Two times a day (BID) | ORAL | 0 refills | Status: AC

## 2018-10-19 NOTE — Progress Notes (Signed)
Physical Therapy Treatment Patient Details Name: Drew Hudson MRN: [id number] DOB: Apr 15, 1936 Today's Date: 10/19/2018    History of Present Illness 82 yo male s/p R TKR on 10/18/18. PMH includes OA, cataracts, CKDIII, DMII, edema, GERD, glaucoma, headaches, HLD, HTN, peripheral neuropathy, insomnia, OSA, prostate cancer s/p prostatectomy, HOH.     PT Comments    POD # 1 pm session Removed pt from CPM and assisted OOB.  Pt used his belt to self assist R LE.  Allowed increased time sitting EOB and applied KI.  Assisted with amb a greater distance in hallway.  Then assisted back to bed per pt request and removed KI.   Pt plans to D/C to home tomorrow.    Follow Up Recommendations  Follow surgeon's recommendation for DC plan and follow-up therapies;Supervision for mobility/OOB     Equipment Recommendations  Rolling walker with 5" wheels    Recommendations for Other Services       Precautions / Restrictions Precautions Precaution Comments: instructed on KI use and proper application  Required Braces or Orthoses: Knee Immobilizer - Right Knee Immobilizer - Right: On when out of bed or walking;Discontinue once straight leg raise with < 10 degree lag Restrictions Weight Bearing Restrictions: No Other Position/Activity Restrictions: WBAT     Mobility  Bed Mobility Overal bed mobility: Needs Assistance Bed Mobility: Supine to Sit;Sit to Supine     Supine to sit: Min guard Sit to supine: Min assist   General bed mobility comments: demonstrated and instructed how to use a belt to self assist LE.  Transfers Overall transfer level: Needs assistance Equipment used: Rolling walker (2 wheeled) Transfers: Sit to/from Stand Sit to Stand: Min guard;From elevated surface         General transfer comment: Min guard for safety. Verbal cuing for hand placement. Pt with self-steadying upon standing.  Ambulation/Gait Ambulation/Gait assistance: Min guard;Supervision Gait Distance  (Feet): 75 Feet Assistive device: Rolling walker (2 wheeled) Gait Pattern/deviations: Step-to pattern;Decreased weight shift to right;Antalgic Gait velocity: decreased    General Gait Details: 25% VC's on proper walker to self distance and safety with turns   Marine scientist Rankin (Stroke Patients Only)       Balance                                            Cognition Arousal/Alertness: Awake/alert Behavior During Therapy: WFL for tasks assessed/performed Overall Cognitive Status: Within Functional Limits for tasks assessed                                        Exercises      General Comments        Pertinent Vitals/Pain Pain Assessment: 0-10 Pain Score: 5  Pain Location: R knee  Pain Descriptors / Indicators: Sore;Operative site guarding;Tender Pain Intervention(s): Monitored during session;Repositioned;Premedicated before session;Ice applied    Home Living                      Prior Function            PT Goals (current goals can now be found in the care plan section) Progress towards PT goals: Progressing toward goals  Frequency    7X/week      PT Plan Current plan remains appropriate    Co-evaluation              AM-PAC PT "6 Clicks" Mobility   Outcome Measure  Help needed turning from your back to your side while in a flat bed without using bedrails?: A Little Help needed moving from lying on your back to sitting on the side of a flat bed without using bedrails?: A Little Help needed moving to and from a bed to a chair (including a wheelchair)?: A Little Help needed standing up from a chair using your arms (e.g., wheelchair or bedside chair)?: A Little Help needed to walk in hospital room?: A Little Help needed climbing 3-5 steps with a railing? : A Little 6 Click Score: 18    End of Session Equipment Utilized During Treatment: Gait belt;Right  knee immobilizer Activity Tolerance: Patient tolerated treatment well;Patient limited by pain Patient left: with chair alarm set;in chair;with call bell/phone within reach;with family/visitor present;with SCD's reapplied Nurse Communication: Mobility status PT Visit Diagnosis: Other abnormalities of gait and mobility (R26.89);Difficulty in walking, not elsewhere classified (R26.2)     Time: 1962-2297 PT Time Calculation (min) (ACUTE ONLY): 15 min  Charges:  $Gait Training: 8-22 mins $Therapeutic Exercise: 8-22 mins                     Rica Koyanagi  PTA Acute  Rehabilitation Services Pager      [phone] Office      [phone]

## 2018-10-19 NOTE — Care Management Obs Status (Signed)
[place] NOTIFICATION   Patient Details  Name: Drew Hudson MRN: [id number] Date of Birth: 1935/12/03   Medicare Observation Status Notification Given:  Yes    Guadalupe Maple, RN 10/19/2018, 2:25 PM

## 2018-10-19 NOTE — Progress Notes (Signed)
Subjective: 1 Day Post-Op Procedure(s) (LRB): RIGHT TOTAL KNEE ARTHROPLASTY (Right) Patient reports pain as moderate.   Patient seen in rounds by Dr. Wynelle Link. Patient is well, and has had no acute complaints or problems other than pain in the right knee. No issues overnight. Denies chest pain, SOB, or calf pain. Voiding without difficulty. We will continue therapy today.   Objective: Vital signs in last 24 hours: Temp:  [97.6 F (36.4 C)-98.7 F (37.1 C)] 98.4 F (36.9 C) (12/17 0619) Pulse Rate:  [49-71] 59 (12/17 0619) Resp:  [10-24] 18 (12/17 0619) BP: (138-176)/(73-93) 142/73 (12/17 0619) SpO2:  [97 %-100 %] 97 % (12/17 0619) Weight:  [101.2 kg] 101.2 kg (12/16 1005)  Intake/Output from previous day:  Intake/Output Summary (Last 24 hours) at 10/19/2018 0756 Last data filed at 10/19/2018 0600 Gross per 24 hour  Intake 4566.67 ml  Output 1455 ml  Net 3111.67 ml    Labs: Recent Labs    10/19/18 0446  HGB 11.7*   Recent Labs    10/19/18 0446  WBC 7.2  RBC 4.26  HCT 36.6*  PLT 129*   Recent Labs    10/19/18 0446  NA 138  K 4.1  CL 109  CO2 21*  BUN 18  CREATININE 1.35*  GLUCOSE 136*  CALCIUM 8.2*   Exam: General - Patient is Alert and Oriented Extremity - Neurologically intact Neurovascular intact Sensation intact distally Dorsiflexion/Plantar flexion intact Dressing - dressing C/D/I Motor Function - intact, moving foot and toes well on exam.   Past Medical History:  Diagnosis Date  . Arthritis   . Cataract   . Chronic kidney disease    Stage 3 per pt  . Diabetes mellitus without complication (HCC)    diet controlled  . Dizziness   . Dry eyes   . Edema    Bilateral legs  . Fecal incontinence   . Generalized weakness   . GERD (gastroesophageal reflux disease)   . Glaucoma   . Headache   . History of chest pain    Saw cardiologist, no blockages noted, possible gas pains  . Hyperlipidemia   . Hypertension   . Idiopathic peripheral  neuropathy   . Insomnia   . OSA on CPAP   . Prostate cancer ([place])   . Urinary urgency   . Vitamin D deficiency   . Wears glasses   . Wears hearing aid in both ears   . Wears partial dentures     Assessment/Plan: 1 Day Post-Op Procedure(s) (LRB): RIGHT TOTAL KNEE ARTHROPLASTY (Right) Active Problems:   OA (osteoarthritis) of knee  Estimated body mass index is 30.67 kg/m as calculated from the following:   Height as of this encounter: 5' 11.5" (1.816 m).   Weight as of this encounter: 101.2 kg. Advance diet Up with therapy  Anticipated LOS equal to or greater than 2 midnights due to - Age 41 and older with one or more of the following:  - Obesity  - Expected need for hospital services (PT, OT, Nursing) required for safe  discharge  - Anticipated need for postoperative skilled nursing care or inpatient rehab  - Active co-morbidities: Diabetes and chronic kidney disease (stage III) OR   - Unanticipated findings during/Post Surgery: None  - Patient is a high risk of re-admission due to: None    DVT Prophylaxis - Aspirin Weight bearing as tolerated. D/C O2 and pulse ox and try on room air. Hemovac pulled without difficulty, will continue therapy today.  Creatinine  stable at baseline (1.35). Plan is to go Home after hospital stay. Plan for discharge tomorrow pending progress with therapy.  Theresa Duty, PA-C Orthopedic Surgery 10/19/2018, 7:56 AM

## 2018-10-19 NOTE — Progress Notes (Signed)
Physical Therapy Treatment Patient Details Name: Drew Hudson MRN: [id number] DOB: 05-Mar-1936 Today's Date: 10/19/2018    History of Present Illness 82 yo male s/p R TKR on 10/18/18. PMH includes OA, cataracts, CKDIII, DMII, edema, GERD, glaucoma, headaches, HLD, HTN, peripheral neuropathy, insomnia, OSA, prostate cancer s/p prostatectomy, HOH.     PT Comments    POD # 1 am session Daughter present during session.  Applied KI and instructed on use as well as proper application.   Demonstrated and instructed pt how to use belt to self assist LE as a leg lifter. Assisted with General transfer comment: Min guard for safety. Verbal cuing for hand placement. Pt with self-steadying upon standing. Then General Gait Details: 25% VC's on proper walker to self distance and safety with turns.  Returned to room to Performed some TE's following HEP handout.  Instructed on proper tech, freq as well as use of ICE.   Pt plans to D/C to home tomorrow.    Follow Up Recommendations  Follow surgeon's recommendation for DC plan and follow-up therapies;Supervision for mobility/OOB     Equipment Recommendations  Rolling walker with 5" wheels    Recommendations for Other Services       Precautions / Restrictions Precautions Precaution Comments: instructed on KI use and proper application  Required Braces or Orthoses: Knee Immobilizer - Right Knee Immobilizer - Right: On when out of bed or walking;Discontinue once straight leg raise with < 10 degree lag Restrictions Weight Bearing Restrictions: No Other Position/Activity Restrictions: WBAT     Mobility  Bed Mobility Overal bed mobility: Needs Assistance Bed Mobility: Supine to Sit     Supine to sit: Min guard     General bed mobility comments: demonstrated and instructed how to use a belt to self assist LE.  Transfers Overall transfer level: Needs assistance Equipment used: Rolling walker (2 wheeled) Transfers: Sit to/from Stand Sit to  Stand: Min guard;From elevated surface         General transfer comment: Min guard for safety. Verbal cuing for hand placement. Pt with self-steadying upon standing.  Ambulation/Gait Ambulation/Gait assistance: Min guard;Supervision Gait Distance (Feet): 45 Feet Assistive device: Rolling walker (2 wheeled) Gait Pattern/deviations: Step-to pattern;Decreased weight shift to right;Antalgic Gait velocity: decreased    General Gait Details: 25% VC's on proper walker to self distance and safety with turns   Marine scientist Rankin (Stroke Patients Only)       Balance                                            Cognition Arousal/Alertness: Awake/alert Behavior During Therapy: WFL for tasks assessed/performed Overall Cognitive Status: Within Functional Limits for tasks assessed                                        Exercises   Total Knee Replacement TE's 10 reps B LE ankle pumps 10 reps towel squeezes 10 reps knee presses 10 reps heel slides  10 reps SAQ's 10 reps SLR's 10 reps ABD Followed by ICE    General Comments        Pertinent Vitals/Pain Pain Assessment: 0-10 Pain Score: 5  Pain Location: R knee  Pain Descriptors / Indicators: Sore;Operative site guarding;Tender Pain Intervention(s): Monitored during session;Repositioned;Premedicated before session;Ice applied    Home Living                      Prior Function            PT Goals (current goals can now be found in the care plan section) Progress towards PT goals: Progressing toward goals    Frequency    7X/week      PT Plan Current plan remains appropriate    Co-evaluation              AM-PAC PT "6 Clicks" Mobility   Outcome Measure  Help needed turning from your back to your side while in a flat bed without using bedrails?: A Little Help needed moving from lying on your back to sitting on the  side of a flat bed without using bedrails?: A Little Help needed moving to and from a bed to a chair (including a wheelchair)?: A Little Help needed standing up from a chair using your arms (e.g., wheelchair or bedside chair)?: A Little Help needed to walk in hospital room?: A Little Help needed climbing 3-5 steps with a railing? : A Little 6 Click Score: 18    End of Session Equipment Utilized During Treatment: Gait belt;Right knee immobilizer Activity Tolerance: Patient tolerated treatment well;Patient limited by pain Patient left: with chair alarm set;in chair;with call bell/phone within reach;with family/visitor present;with SCD's reapplied Nurse Communication: Mobility status PT Visit Diagnosis: Other abnormalities of gait and mobility (R26.89);Difficulty in walking, not elsewhere classified (R26.2)     Time: 4098-1191 PT Time Calculation (min) (ACUTE ONLY): 30 min  Charges:  $Gait Training: 8-22 mins $Therapeutic Exercise: 8-22 mins                     {Maaliyah Adolph  PTA Acute  Rehabilitation Services Pager      [phone] Office      [phone]

## 2018-10-20 DIAGNOSIS — M1711 Unilateral primary osteoarthritis, right knee: Secondary | ICD-10-CM | POA: Diagnosis not present

## 2018-10-20 LAB — BASIC METABOLIC PANEL
Anion gap: 8 (ref 5–15)
BUN: 21 mg/dL (ref 8–23)
CO2: 23 mmol/L (ref 22–32)
Calcium: 8.5 mg/dL — ABNORMAL LOW (ref 8.9–10.3)
Chloride: 107 mmol/L (ref 98–111)
Creatinine, Ser: 1.34 mg/dL — ABNORMAL HIGH (ref 0.61–1.24)
GFR calc Af Amer: 58 mL/min — ABNORMAL LOW (ref >60)
GFR calc non Af Amer: 50 mL/min — ABNORMAL LOW (ref >60)
Glucose, Bld: 109 mg/dL — ABNORMAL HIGH (ref 70–99)
Potassium: 3.9 mmol/L (ref 3.5–5.1)
Sodium: 138 mmol/L (ref 135–145)

## 2018-10-20 LAB — CBC
HCT: 35.5 % — ABNORMAL LOW (ref 39.0–52.0)
HEMOGLOBIN: 11.7 g/dL — AB (ref 13.0–17.0)
MCH: 27.7 pg (ref 26.0–34.0)
MCHC: 33 g/dL (ref 30.0–36.0)
MCV: 84.1 fL (ref 80.0–100.0)
Platelets: 116 10*3/uL — ABNORMAL LOW (ref 150–400)
RBC: 4.22 MIL/uL (ref 4.22–5.81)
RDW: 13.5 % (ref 11.5–15.5)
WBC: 8.9 10*3/uL (ref 4.0–10.5)
nRBC: 0 % (ref 0.0–0.2)

## 2018-10-20 LAB — GLUCOSE, CAPILLARY: Glucose-Capillary: 83 mg/dL (ref 70–99)

## 2018-10-20 NOTE — Plan of Care (Signed)
Pt alert and oriented, resting with pain well controlled. Plan to d/c per MD order today. RN will monitor.

## 2018-10-20 NOTE — Progress Notes (Signed)
Physical Therapy Treatment Patient Details Name: Drew Hudson MRN: [id number] DOB: [birth year] Today's Date: 10/20/2018    History of Present Illness 82 yo male s/p R TKR on 10/18/18. PMH includes OA, cataracts, CKDIII, DMII, edema, GERD, glaucoma, headaches, HLD, HTN, peripheral neuropathy, insomnia, OSA, prostate cancer s/p prostatectomy, HOH.     PT Comments    Pt progressing well, reviewed stairs, HEP, gait and RW safety; ready for d/c  Follow Up Recommendations  Follow surgeon's recommendation for DC plan and follow-up therapies;Supervision for mobility/OOB     Equipment Recommendations  Rolling walker with 5" wheels    Recommendations for Other Services       Precautions / Restrictions Precautions Precautions: Knee Required Braces or Orthoses: Knee Immobilizer - Right Knee Immobilizer - Right: Discontinue once straight leg raise with < 10 degree lag Restrictions Weight Bearing Restrictions: No Other Position/Activity Restrictions: WBAT     Mobility  Bed Mobility Overal bed mobility: Needs Assistance Bed Mobility: Supine to Sit     Supine to sit: Min guard     General bed mobility comments: demonstrated and instructed how to use a belt to self assist LE.  Transfers Overall transfer level: Needs assistance Equipment used: Rolling walker (2 wheeled) Transfers: Sit to/from Stand Sit to Stand: Supervision;Min guard         General transfer comment: Min guard for safety. Verbal cuing for hand placement. Pt with self-steadying upon standing.  Ambulation/Gait Ambulation/Gait assistance: Supervision Gait Distance (Feet): 65 Feet(15') Assistive device: Rolling walker (2 wheeled) Gait Pattern/deviations: Step-to pattern;Decreased weight shift to right;Antalgic     General Gait Details: cues for Rw position   Stairs Stairs: Yes Stairs assistance: Min guard;Min assist Stair Management: Two rails;Step to pattern;Forwards Number of Stairs: 3 General stair  comments: cues for safety and sequence   Wheelchair Mobility    Modified Rankin (Stroke Patients Only)       Balance                                            Cognition Arousal/Alertness: Awake/alert Behavior During Therapy: WFL for tasks assessed/performed Overall Cognitive Status: Within Functional Limits for tasks assessed                                        Exercises Total Joint Exercises Ankle Circles/Pumps: AROM;Both;10 reps Quad Sets: AROM;Both;10 reps Heel Slides: AAROM;Right;10 reps Straight Leg Raises: AAROM;Right;10 reps Goniometric ROM: ~8* to 60* AAROM right knee flexion    General Comments        Pertinent Vitals/Pain Pain Assessment: 0-10 Pain Score: 2  Pain Location: R knee  Pain Descriptors / Indicators: Sore;Operative site guarding;Tender Pain Intervention(s): Limited activity within patient's tolerance;Monitored during session;Premedicated before session;Repositioned;Ice applied    Home Living                      Prior Function            PT Goals (current goals can now be found in the care plan section) Acute Rehab PT Goals Patient Stated Goal: home today! PT Goal Formulation: With patient Time For Goal Achievement: 10/25/18 Potential to Achieve Goals: Good Progress towards PT goals: Progressing toward goals    Frequency    7X/week  PT Plan Current plan remains appropriate    Co-evaluation              AM-PAC PT "6 Clicks" Mobility   Outcome Measure  Help needed turning from your back to your side while in a flat bed without using bedrails?: A Little Help needed moving from lying on your back to sitting on the side of a flat bed without using bedrails?: A Little Help needed moving to and from a bed to a chair (including a wheelchair)?: A Little Help needed standing up from a chair using your arms (e.g., wheelchair or bedside chair)?: A Little Help needed to walk in  hospital room?: A Little Help needed climbing 3-5 steps with a railing? : A Little 6 Click Score: 18    End of Session Equipment Utilized During Treatment: Gait belt;Right knee immobilizer Activity Tolerance: Patient tolerated treatment well Patient left: in chair;with call bell/phone within reach;with family/visitor present Nurse Communication: Mobility status PT Visit Diagnosis: Other abnormalities of gait and mobility (R26.89);Difficulty in walking, not elsewhere classified (R26.2)     Time: 9449-6759 PT Time Calculation (min) (ACUTE ONLY): 32 min  Charges:  $Gait Training: 8-22 mins $Therapeutic Exercise: 8-22 mins                     Kenyon Ana, PT  Pager: [phone] Acute Rehab Dept [facility]): [phone]   10/20/2018    [facility] 10/20/2018, 10:58 AM

## 2018-10-20 NOTE — Progress Notes (Signed)
   Subjective: 2 Days Post-Op Procedure(s) (LRB): RIGHT TOTAL KNEE ARTHROPLASTY (Right) Patient reports pain as mild.   Patient seen in rounds for Dr. Wynelle Link. Patient is well, and has had no acute complaints or problems. States he is ready to go home. Denies chest pain or SOB. Voiding without difficulty and positive flatus. No issues overnight. Plan is to go Home after hospital stay.  Objective: Vital signs in last 24 hours: Temp:  [97.9 F (36.6 C)-98.6 F (37 C)] 98.3 F (36.8 C) (12/18 0505) Pulse Rate:  [60-66] 60 (12/18 0505) Resp:  [16-18] 18 (12/18 0505) BP: (144-156)/(69-81) 149/78 (12/18 0505) SpO2:  [94 %-99 %] 99 % (12/18 0505)  Intake/Output from previous day:  Intake/Output Summary (Last 24 hours) at 10/20/2018 0831 Last data filed at 10/20/2018 0743 Gross per 24 hour  Intake 840 ml  Output 2540 ml  Net -1700 ml    Intake/Output this shift: Total I/O In: -  Out: 300 [Urine:300]  Labs: Recent Labs    10/19/18 0446 10/20/18 0549  HGB 11.7* 11.7*   Recent Labs    10/19/18 0446 10/20/18 0549  WBC 7.2 8.9  RBC 4.26 4.22  HCT 36.6* 35.5*  PLT 129* 116*   Recent Labs    10/19/18 0446 10/20/18 0549  NA 138 138  K 4.1 3.9  CL 109 107  CO2 21* 23  BUN 18 21  CREATININE 1.35* 1.34*  GLUCOSE 136* 109*  CALCIUM 8.2* 8.5*   No results for input(s): LABPT, INR in the last 72 hours.  Exam: General - Patient is Alert and Oriented Extremity - Neurologically intact Neurovascular intact Sensation intact distally Dorsiflexion/Plantar flexion intact Dressing/Incision - clean, dry, with moderate bloody drainage from hemovac port site Motor Function - intact, moving foot and toes well on exam.   Past Medical History:  Diagnosis Date  . Arthritis   . Cataract   . Chronic kidney disease    Stage 3 per pt  . Diabetes mellitus without complication (HCC)    diet controlled  . Dizziness   . Dry eyes   . Edema    Bilateral legs  . Fecal  incontinence   . Generalized weakness   . GERD (gastroesophageal reflux disease)   . Glaucoma   . Headache   . History of chest pain    Saw cardiologist, no blockages noted, possible gas pains  . Hyperlipidemia   . Hypertension   . Idiopathic peripheral neuropathy   . Insomnia   . OSA on CPAP   . Prostate cancer ([place])   . Urinary urgency   . Vitamin D deficiency   . Wears glasses   . Wears hearing aid in both ears   . Wears partial dentures     Assessment/Plan: 2 Days Post-Op Procedure(s) (LRB): RIGHT TOTAL KNEE ARTHROPLASTY (Right) Active Problems:   OA (osteoarthritis) of knee  Estimated body mass index is 30.67 kg/m as calculated from the following:   Height as of this encounter: 5' 11.5" (1.816 m).   Weight as of this encounter: 101.2 kg. Up with therapy D/C IV fluids  DVT Prophylaxis - Aspirin Weight-bearing as tolerated  Plan for discharge to home after one session of therapy this AM. Scheduled for outpatient physical therapy at [facility]. Follow-up in the office in 2 weeks with Dr. Wynelle Link.  Theresa Duty, PA-C Orthopedic Surgery 10/20/2018, 8:31 AM

## 2018-10-25 NOTE — Discharge Summary (Signed)
Physician Discharge Summary   Patient ID: Drew Hudson MRN: [id number] DOB/AGE: 1935-12-13 82 y.o.  Admit date: 10/18/2018 Discharge date: 10/20/2018  Primary Diagnosis: Osteoarthritis, right knee   Admission Diagnoses:  Past Medical History:  Diagnosis Date  . Arthritis   . Cataract   . Chronic kidney disease    Stage 3 per pt  . Diabetes mellitus without complication (HCC)    diet controlled  . Dizziness   . Dry eyes   . Edema    Bilateral legs  . Fecal incontinence   . Generalized weakness   . GERD (gastroesophageal reflux disease)   . Glaucoma   . Headache   . History of chest pain    Saw cardiologist, no blockages noted, possible gas pains  . Hyperlipidemia   . Hypertension   . Idiopathic peripheral neuropathy   . Insomnia   . OSA on CPAP   . Prostate cancer ([place])   . Urinary urgency   . Vitamin D deficiency   . Wears glasses   . Wears hearing aid in both ears   . Wears partial dentures    Discharge Diagnoses:   Active Problems:   OA (osteoarthritis) of knee  Estimated body mass index is 30.67 kg/m as calculated from the following:   Height as of this encounter: 5' 11.5" (1.816 m).   Weight as of this encounter: 101.2 kg.  Procedure:  Procedure(s) (LRB): RIGHT TOTAL KNEE ARTHROPLASTY (Right)   Consults: None  HPI: Drew Hudson is a 82 y.o. year old male with end stage OA of his right knee with progressively worsening pain and dysfunction. He has constant pain, with activity and at rest and significant functional deficits with difficulties even with ADLs. He has had extensive non-op management including analgesics, injections of cortisone and viscosupplements, and home exercise program, but remains in significant pain with significant dysfunction. Radiographs show bone on bone arthritis medial and patellofemoral. He presents now for right Total Knee Arthroplasty.    Laboratory Data: Admission on 10/18/2018, Discharged on 10/20/2018  Component  Date Value Ref Range Status  . Glucose-Capillary 10/18/2018 86  70 - 99 mg/dL Final  . WBC 10/19/2018 7.2  4.0 - 10.5 K/uL Final  . RBC 10/19/2018 4.26  4.22 - 5.81 MIL/uL Final  . Hemoglobin 10/19/2018 11.7* 13.0 - 17.0 g/dL Final  . HCT 10/19/2018 36.6* 39.0 - 52.0 % Final  . MCV 10/19/2018 85.9  80.0 - 100.0 fL Final  . MCH 10/19/2018 27.5  26.0 - 34.0 pg Final  . MCHC 10/19/2018 32.0  30.0 - 36.0 g/dL Final  . RDW 10/19/2018 13.5  11.5 - 15.5 % Final  . Platelets 10/19/2018 129* 150 - 400 K/uL Final  . nRBC 10/19/2018 0.0  0.0 - 0.2 % Final   Performed at [facility], [place] [address], [place], [place] [address]  . Sodium 10/19/2018 138  135 - 145 mmol/L Final  . Potassium 10/19/2018 4.1  3.5 - 5.1 mmol/L Final  . Chloride 10/19/2018 109  98 - 111 mmol/L Final  . CO2 10/19/2018 21* 22 - 32 mmol/L Final  . Glucose, Bld 10/19/2018 136* 70 - 99 mg/dL Final  . BUN 10/19/2018 18  8 - 23 mg/dL Final  . Creatinine, Ser 10/19/2018 1.35* 0.61 - 1.24 mg/dL Final  . Calcium 10/19/2018 8.2* 8.9 - 10.3 mg/dL Final  . GFR calc non Af Amer 10/19/2018 49* >60 mL/min Final  . GFR calc Af Amer 10/19/2018 57* >60 mL/min Final  .  Anion gap 10/19/2018 8  5 - 15 Final   Performed at [facility], [place] [address], [place], [place] [address]  . Glucose-Capillary 10/18/2018 187* 70 - 99 mg/dL Final  . Glucose-Capillary 10/18/2018 168* 70 - 99 mg/dL Final  . Glucose-Capillary 10/19/2018 97  70 - 99 mg/dL Final  . Glucose-Capillary 10/19/2018 170* 70 - 99 mg/dL Final  . WBC 10/20/2018 8.9  4.0 - 10.5 K/uL Final  . RBC 10/20/2018 4.22  4.22 - 5.81 MIL/uL Final  . Hemoglobin 10/20/2018 11.7* 13.0 - 17.0 g/dL Final  . HCT 10/20/2018 35.5* 39.0 - 52.0 % Final  . MCV 10/20/2018 84.1  80.0 - 100.0 fL Final  . MCH 10/20/2018 27.7  26.0 - 34.0 pg Final  . MCHC 10/20/2018 33.0  30.0 - 36.0 g/dL Final  . RDW 10/20/2018 13.5  11.5 - 15.5 % Final  . Platelets 10/20/2018 116* 150  - 400 K/uL Final   Comment: REPEATED TO VERIFY PLATELET COUNT CONFIRMED BY SMEAR SPECIMEN CHECKED FOR CLOTS   . nRBC 10/20/2018 0.0  0.0 - 0.2 % Final   Performed at [facility], [place] [address], [place], [place] [address]  . Sodium 10/20/2018 138  135 - 145 mmol/L Final  . Potassium 10/20/2018 3.9  3.5 - 5.1 mmol/L Final  . Chloride 10/20/2018 107  98 - 111 mmol/L Final  . CO2 10/20/2018 23  22 - 32 mmol/L Final  . Glucose, Bld 10/20/2018 109* 70 - 99 mg/dL Final  . BUN 10/20/2018 21  8 - 23 mg/dL Final  . Creatinine, Ser 10/20/2018 1.34* 0.61 - 1.24 mg/dL Final  . Calcium 10/20/2018 8.5* 8.9 - 10.3 mg/dL Final  . GFR calc non Af Amer 10/20/2018 50* >60 mL/min Final  . GFR calc Af Amer 10/20/2018 58* >60 mL/min Final  . Anion gap 10/20/2018 8  5 - 15 Final   Performed at [facility], [place] [address], [place], [place] [address]  . Glucose-Capillary 10/19/2018 174* 70 - 99 mg/dL Final  . Glucose-Capillary 10/19/2018 135* 70 - 99 mg/dL Final  . Glucose-Capillary 10/20/2018 83  70 - 99 mg/dL Final  Hospital Outpatient Visit on 10/12/2018  Component Date Value Ref Range Status  . aPTT 10/12/2018 34  24 - 36 seconds Final   Performed at [facility], [place] [address], [place], [place] [address]  . WBC 10/12/2018 3.7* 4.0 - 10.5 K/uL Final  . RBC 10/12/2018 5.05  4.22 - 5.81 MIL/uL Final  . Hemoglobin 10/12/2018 14.1  13.0 - 17.0 g/dL Final  . HCT 10/12/2018 43.8  39.0 - 52.0 % Final  . MCV 10/12/2018 86.7  80.0 - 100.0 fL Final  . MCH 10/12/2018 27.9  26.0 - 34.0 pg Final  . MCHC 10/12/2018 32.2  30.0 - 36.0 g/dL Final  . RDW 10/12/2018 13.8  11.5 - 15.5 % Final  . Platelets 10/12/2018 149* 150 - 400 K/uL Final  . nRBC 10/12/2018 0.0  0.0 - 0.2 % Final   Performed at [facility], [place] [address], [place], [place] [address]  . Sodium 10/12/2018 142  135 - 145 mmol/L Final  . Potassium 10/12/2018 3.8  3.5 - 5.1  mmol/L Final  . Chloride 10/12/2018 111  98 - 111 mmol/L Final  . CO2 10/12/2018 24  22 - 32 mmol/L Final  . Glucose, Bld 10/12/2018 89  70 - 99 mg/dL Final  . BUN 10/12/2018 18  8 - 23 mg/dL  Final  . Creatinine, Ser 10/12/2018 1.34* 0.61 - 1.24 mg/dL Final  . Calcium 10/12/2018 8.9  8.9 - 10.3 mg/dL Final  . Total Protein 10/12/2018 6.5  6.5 - 8.1 g/dL Final  . Albumin 10/12/2018 3.7  3.5 - 5.0 g/dL Final  . AST 10/12/2018 22  15 - 41 U/L Final  . ALT 10/12/2018 18  0 - 44 U/L Final  . Alkaline Phosphatase 10/12/2018 56  38 - 126 U/L Final  . Total Bilirubin 10/12/2018 0.7  0.3 - 1.2 mg/dL Final  . GFR calc non Af Amer 10/12/2018 50* >60 mL/min Final  . GFR calc Af Amer 10/12/2018 58* >60 mL/min Final  . Anion gap 10/12/2018 7  5 - 15 Final   Performed at [facility], [place] [address], [place], [place] [address]  . Prothrombin Time 10/12/2018 14.0  11.4 - 15.2 seconds Final  . INR 10/12/2018 1.09   Final   Performed at [place] [address], [place], [place] [address]  . ABO/RH(D) 10/12/2018 O POS   Final  . Antibody Screen 10/12/2018 NEG   Final  . Sample Expiration 10/12/2018 10/21/2018   Final  . Extend sample reason 10/12/2018    Final                   Value:NO TRANSFUSIONS OR PREGNANCY IN THE PAST 3 MONTHS Performed at [facility], [place] [address], [place], [place] [address]   . MRSA, PCR 10/12/2018 NEGATIVE  NEGATIVE Final  . Staphylococcus aureus 10/12/2018 NEGATIVE  NEGATIVE Final   Comment: (NOTE) The Xpert SA Assay (FDA approved for NASAL specimens in patients 66 years of age and older), is one component of a comprehensive surveillance program. It is not intended to diagnose infection nor to guide or monitor treatment. Performed at [facility], [place] [address], [place], [place] [address]   . Hgb A1c MFr Bld 10/12/2018 5.6  4.8 - 5.6 % Final   Comment: (NOTE) Pre diabetes:           5.7%-6.4% Diabetes:              >6.4% Glycemic control for   <7.0% adults with diabetes   . Mean Plasma Glucose 10/12/2018 114.02  mg/dL Final   Performed at [place] [address] [place], [place] [address]  . ABO/RH(D) 10/12/2018    Final                   Value:O POS Performed at [facility], [place] [address], [place], [place] [address]      X-Rays:No results found.  EKG:No orders found for this or any previous visit.   Hospital Course: Al Bracewell is a 82 y.o. who was admitted to [facility]. They were brought to the operating room on 10/18/2018 and underwent Procedure(s): RIGHT TOTAL KNEE ARTHROPLASTY.  Patient tolerated the procedure well and was later transferred to the recovery room and then to the orthopaedic floor for postoperative care. They were given PO and IV analgesics for pain control following their surgery. They were given 24 hours of postoperative antibiotics of  Anti-infectives (From admission, onward)   Start     Dose/Rate Route Frequency Ordered Stop   10/18/18 2300  vancomycin (VANCOCIN) IVPB 1000 mg/200 mL premix     1,000 mg 200 mL/hr over 60 Minutes Intravenous  Once 10/18/18 1528 10/19/18 0015   10/18/18 0945  vancomycin (VANCOCIN)  IVPB 1000 mg/200 mL premix  Status:  Discontinued     1,000 mg 200 mL/hr over 60 Minutes Intravenous  Once 10/18/18 0932 10/18/18 0943   10/18/18 0945  vancomycin (VANCOCIN) 1,500 mg in sodium chloride 0.9 % 500 mL IVPB     1,500 mg 250 mL/hr over 120 Minutes Intravenous On call to O.R. 10/18/18 6283 10/18/18 1258     and started on DVT prophylaxis in the form of Aspirin.   PT and OT were ordered for total joint protocol. Discharge planning consulted to help with postop disposition and equipment needs. Patient had a good night on the evening of surgery. They started to get up OOB with therapy on POD #0. Hemovac drain was pulled without difficulty on day one. Creatinine was noted to be  stable at baseline (1.35). Continued to work with therapy into POD #2. Pt was seen during rounds on day two and was ready to go home pending progress with therapy. Dressing was changed and the incision was clean and dry, with moderate bloody drainage from hemovac port site. Pt worked with therapy for one additional session and was meeting their goals. He was discharged to home later that day in stable condition.  Diet: Diabetic diet Activity: WBAT Follow-up: in 2 weeks with Dr. Wynelle Link Disposition: Home with outpatient physical therapy at [facility] Discharged Condition: stable   Discharge Instructions    Call MD / Call 911   Complete by:  As directed    If you experience chest pain or shortness of breath, CALL 911 and be transported to the hospital emergency room.  If you develope a fever above 101 F, pus (white drainage) or increased drainage or redness at the wound, or calf pain, call your surgeon's office.   Change dressing   Complete by:  As directed    Change the dressing daily with sterile 4 x 4 inch gauze dressing and apply TED hose.   Constipation Prevention   Complete by:  As directed    Drink plenty of fluids.  Prune juice may be helpful.  You may use a stool softener, such as Colace (over the counter) 100 mg twice a day.  Use MiraLax (over the counter) for constipation as needed.   Diet - low sodium heart healthy   Complete by:  As directed    Discharge instructions   Complete by:  As directed    Dr. Gaynelle Arabian Total Joint Specialist [facility] [address], [place], [place] [address] [phone]  TOTAL KNEE REPLACEMENT POSTOPERATIVE DIRECTIONS  Knee Rehabilitation, Guidelines Following Surgery  Results after knee surgery are often greatly improved when you follow the exercise, range of motion and muscle strengthening exercises prescribed by your doctor. Safety measures are also important to protect the knee from further injury. Any time any of these  exercises cause you to have increased pain or swelling in your knee joint, decrease the amount until you are comfortable again and slowly increase them. If you have problems or questions, call your caregiver or physical therapist for advice.   HOME CARE INSTRUCTIONS  Remove items at home which could result in a fall. This includes throw rugs or furniture in walking pathways.  ICE to the affected knee every three hours for 30 minutes at a time and then as needed for pain and swelling.  Continue to use ice on the knee for pain and swelling from surgery. You may notice swelling that will progress down to the foot and ankle.  This is normal after surgery.  Elevate the leg when you are not up walking on it.   Continue to use the breathing machine which will help keep your temperature down.  It is common for your temperature to cycle up and down following surgery, especially at night when you are not up moving around and exerting yourself.  The breathing machine keeps your lungs expanded and your temperature down. Do not place pillow under knee, focus on keeping the knee straight while resting   DIET You may resume your previous home diet once your are discharged from the hospital.  DRESSING / WOUND CARE / SHOWERING You may shower 3 days after surgery, but keep the wounds dry during showering.  You may use an occlusive plastic wrap (Press'n Seal for example), NO SOAKING/SUBMERGING IN THE BATHTUB.  If the bandage gets wet, change with a clean dry gauze.  If the incision gets wet, pat the wound dry with a clean towel. You may start showering once you are discharged home but do not submerge the incision under water. Just pat the incision dry and apply a dry gauze dressing on daily. Change the surgical dressing daily and reapply a dry dressing each time.  ACTIVITY Walk with your walker as instructed. Use walker as long as suggested by your caregivers. Avoid periods of inactivity such as sitting longer than  an hour when not asleep. This helps prevent blood clots.  You may resume a sexual relationship in one month or when given the OK by your doctor.  You may return to work once you are cleared by your doctor.  Do not drive a car for 6 weeks or until released by you surgeon.  Do not drive while taking narcotics.  WEIGHT BEARING Weight bearing as tolerated with assist device (walker, cane, etc) as directed, use it as long as suggested by your surgeon or therapist, typically at least 4-6 weeks.  POSTOPERATIVE CONSTIPATION PROTOCOL Constipation - defined medically as fewer than three stools per week and severe constipation as less than one stool per week.  One of the most common issues patients have following surgery is constipation.  Even if you have a regular bowel pattern at home, your normal regimen is likely to be disrupted due to multiple reasons following surgery.  Combination of anesthesia, postoperative narcotics, change in appetite and fluid intake all can affect your bowels.  In order to avoid complications following surgery, here are some recommendations in order to help you during your recovery period.  Colace (docusate) - Pick up an over-the-counter form of Colace or another stool softener and take twice a day as long as you are requiring postoperative pain medications.  Take with a full glass of water daily.  If you experience loose stools or diarrhea, hold the colace until you stool forms back up.  If your symptoms do not get better within 1 week or if they get worse, check with your doctor.  Dulcolax (bisacodyl) - Pick up over-the-counter and take as directed by the product packaging as needed to assist with the movement of your bowels.  Take with a full glass of water.  Use this product as needed if not relieved by Colace only.   MiraLax (polyethylene glycol) - Pick up over-the-counter to have on hand.  MiraLax is a solution that will increase the amount of water in your bowels to assist  with bowel movements.  Take as directed and can mix with a glass of water, juice, soda, coffee, or  tea.  Take if you go more than two days without a movement. Do not use MiraLax more than once per day. Call your doctor if you are still constipated or irregular after using this medication for 7 days in a row.  If you continue to have problems with postoperative constipation, please contact the office for further assistance and recommendations.  If you experience "the worst abdominal pain ever" or develop nausea or vomiting, please contact the office immediatly for further recommendations for treatment.  ITCHING  If you experience itching with your medications, try taking only a single pain pill, or even half a pain pill at a time.  You can also use Benadryl over the counter for itching or also to help with sleep.   TED HOSE STOCKINGS Wear the elastic stockings on both legs for three weeks following surgery during the day but you may remove then at night for sleeping.  MEDICATIONS See your medication summary on the "After Visit Summary" that the nursing staff will review with you prior to discharge.  You may have some home medications which will be placed on hold until you complete the course of blood thinner medication.  It is important for you to complete the blood thinner medication as prescribed by your surgeon.  Continue your approved medications as instructed at time of discharge.  PRECAUTIONS If you experience chest pain or shortness of breath - call 911 immediately for transfer to the hospital emergency department.  If you develop a fever greater that 101 F, purulent drainage from wound, increased redness or drainage from wound, foul odor from the wound/dressing, or calf pain - CONTACT YOUR SURGEON.                                                   FOLLOW-UP APPOINTMENTS Make sure you keep all of your appointments after your operation with your surgeon and caregivers. You should call the  office at the above phone number and make an appointment for approximately two weeks after the date of your surgery or on the date instructed by your surgeon outlined in the "After Visit Summary".   RANGE OF MOTION AND STRENGTHENING EXERCISES  Rehabilitation of the knee is important following a knee injury or an operation. After just a few days of immobilization, the muscles of the thigh which control the knee become weakened and shrink (atrophy). Knee exercises are designed to build up the tone and strength of the thigh muscles and to improve knee motion. Often times heat used for twenty to thirty minutes before working out will loosen up your tissues and help with improving the range of motion but do not use heat for the first two weeks following surgery. These exercises can be done on a training (exercise) mat, on the floor, on a table or on a bed. Use what ever works the best and is most comfortable for you Knee exercises include:  Leg Lifts - While your knee is still immobilized in a splint or cast, you can do straight leg raises. Lift the leg to 60 degrees, hold for 3 sec, and slowly lower the leg. Repeat 10-20 times 2-3 times daily. Perform this exercise against resistance later as your knee gets better.  Quad and Hamstring Sets - Tighten up the muscle on the front of the thigh (Quad) and hold for 5-10 sec.  Repeat this 10-20 times hourly. Hamstring sets are done by pushing the foot backward against an object and holding for 5-10 sec. Repeat as with quad sets.  Leg Slides: Lying on your back, slowly slide your foot toward your buttocks, bending your knee up off the floor (only go as far as is comfortable). Then slowly slide your foot back down until your leg is flat on the floor again. Angel Wings: Lying on your back spread your legs to the side as far apart as you can without causing discomfort.  A rehabilitation program following serious knee injuries can speed recovery and prevent re-injury in the  future due to weakened muscles. Contact your doctor or a physical therapist for more information on knee rehabilitation.   IF YOU ARE TRANSFERRED TO A SKILLED REHAB FACILITY If the patient is transferred to a skilled rehab facility following release from the hospital, a list of the current medications will be sent to the facility for the patient to continue.  When discharged from the skilled rehab facility, please have the facility set up the patient's [place] prior to being released. Also, the skilled facility will be responsible for providing the patient with their medications at time of release from the facility to include their pain medication, the muscle relaxants, and their blood thinner medication. If the patient is still at the rehab facility at time of the two week follow up appointment, the skilled rehab facility will also need to assist the patient in arranging follow up appointment in our office and any transportation needs.  MAKE SURE YOU:  Understand these instructions.  Get help right away if you are not doing well or get worse.    Pick up stool softner and laxative for home use following surgery while on pain medications. Do not submerge incision under water. Please use good hand washing techniques while changing dressing each day. May shower starting three days after surgery. Please use a clean towel to pat the incision dry following showers. Continue to use ice for pain and swelling after surgery. Do not use any lotions or creams on the incision until instructed by your surgeon.   Do not put a pillow under the knee. Place it under the heel.   Complete by:  As directed    Driving restrictions   Complete by:  As directed    No driving for two weeks   TED hose   Complete by:  As directed    Use stockings (TED hose) for three weeks on both leg(s).  You may remove them at night for sleeping.   Weight bearing as tolerated   Complete by:  As directed       Allergies as of 10/20/2018      Reactions   Penicillins Rash   Has patient had a PCN reaction causing immediate rash, facial/tongue/throat swelling, SOB or lightheadedness with hypotension: No Has patient had a PCN reaction causing severe rash involving mucus membranes or skin necrosis: No Has patient had a PCN reaction that required hospitalization: No Has patient had a PCN reaction occurring within the last 10 years: Yes If all of the above answers are "NO", then may proceed with Cephalosporin use.      Medication List    TAKE these medications   amLODipine 10 MG tablet Commonly known as:  NORVASC Take 10 mg by mouth daily.   aspirin 325 MG EC tablet Take 1 tablet (325 mg total) by mouth 2 (two) times daily for  19 days. Then resume one 81 mg aspirin once a day. What changed:    medication strength  how much to take  when to take this  additional instructions   atorvastatin 20 MG tablet Commonly known as:  LIPITOR Take 20 mg by mouth daily.   dorzolamide-timolol 22.3-6.8 MG/ML ophthalmic solution Commonly known as:  COSOPT Place 1 drop into both eyes 2 (two) times daily.   hydrocortisone 2.5 % cream Apply 1 application topically daily as needed (itching).   latanoprost 0.005 % ophthalmic solution Commonly known as:  XALATAN Place 1 drop into both eyes every morning.   lisinopril 20 MG tablet Commonly known as:  PRINIVIL,ZESTRIL Take 20 mg by mouth daily.   methocarbamol 500 MG tablet Commonly known as:  ROBAXIN Take 1 tablet (500 mg total) by mouth every 6 (six) hours as needed for muscle spasms.   omeprazole 20 MG capsule Commonly known as:  PRILOSEC Take 20 mg by mouth daily.   oxyCODONE 5 MG immediate release tablet Commonly known as:  Oxy IR/ROXICODONE Take 1-2 tablets (5-10 mg total) by mouth every 6 (six) hours as needed for severe pain.   polyethylene glycol packet Commonly known as:  MIRALAX / GLYCOLAX Take 17 g by mouth daily as needed for  moderate constipation.   sertraline 25 MG tablet Commonly known as:  ZOLOFT Take 25 mg by mouth at bedtime.   traMADol 50 MG tablet Commonly known as:  ULTRAM Take 1-2 tablets (50-100 mg total) by mouth every 6 (six) hours as needed for moderate pain.   vitamin E 400 UNIT capsule Take 400 Units by mouth daily.            Discharge Care Instructions  (From admission, onward)         Start     Ordered   10/19/18 0000  Weight bearing as tolerated     10/19/18 0759   10/19/18 0000  Change dressing    Comments:  Change the dressing daily with sterile 4 x 4 inch gauze dressing and apply TED hose.   10/19/18 0759         Follow-up Information    [facility]. Go on 10/22/2018.   Why:  You are scheduled for a physical therapy evaluation on 10-22-18 at 2:00 pm Contact information: [id number] [address], [place], [place] [address] [phone]       Derl Barrow, [place]. Go on 11/01/2018.   Specialty:  Orthopedic Surgery Why:  You are scheduled for a post-operative appointment on 11-01-18 at 3:00 pm Contact information: [address] [place] [address] [phone]           Signed: Theresa Duty, PA-C Orthopedic Surgery 10/25/2018, 7:39 AM

## 2019-03-02 DIAGNOSIS — E041 Nontoxic single thyroid nodule: Secondary | ICD-10-CM | POA: Insufficient documentation

## 2019-08-18 NOTE — Patient Instructions (Addendum)
DUE TO COVID-19 ONLY ONE VISITOR IS ALLOWED TO COME WITH YOU AND STAY IN THE WAITING ROOM ONLY DURING PRE OP AND PROCEDURE DAY OF SURGERY. THE 1 VISITOR MAY VISIT WITH YOU AFTER SURGERY IN YOUR PRIVATE ROOM DURING VISITING HOURS ONLY!  YOU NEED TO HAVE A COVID 19 TEST ON Friday 08/19/2019 @ 12:40 pm, THIS TEST MUST BE DONE BEFORE SURGERY, COME  [place], [place] , [address].  ([place]) ONCE YOUR COVID TEST IS COMPLETED, PLEASE BEGIN THE QUARANTINE INSTRUCTIONS AS OUTLINED IN YOUR HANDOUT.                Drew Hudson  08/18/2019   Your procedure is scheduled on: 08-22-19    Report to [facility] Main  Entrance    Report to Admitting at  2:15 PM     Call this number if you have problems the morning of surgery [phone]    Remember: NO SOLID FOOD AFTER MIDNIGHT THE NIGHT PRIOR TO SURGERY. NOTHING BY MOUTH EXCEPT CLEAR LIQUIDS UNTIL 1:45 PM . PLEASE FINISH G2  DRINK PER SURGEON ORDER  WHICH NEEDS TO BE COMPLETED AT 1:45 PM .   CLEAR LIQUID DIET   Foods Allowed                                                                     Foods Excluded  Coffee and tea, regular and decaf                             liquids that you cannot  Plain Jell-O any favor except red or purple                                           see through such as: Fruit ices (not with fruit pulp)                                     milk, soups, orange juice  Iced Popsicles                                    All solid food Carbonated beverages, regular and diet                                    Cranberry, grape and apple juices Sports drinks like Gatorade Lightly seasoned clear broth or consume(fat free) Sugar, honey syrup  Sample Menu Breakfast                                Lunch                                     Supper Cranberry juice  Beef broth                            Chicken broth Jell-O                                     Grape juice                            Apple juice Coffee or tea                        Jell-O                                      Popsicle                                                Coffee or tea                        Coffee or tea  _____________________________________________________________________        Take these medicines the morning of surgery with A SIP OF WATER: Atorvastatin (Liptor), Omeprazole (Prilosec). You may also use your eyedrops  BRUSH YOUR TEETH MORNING OF SURGERY AND RINSE YOUR MOUTH OUT, NO CHEWING GUM CANDY OR MINTS.                                You may not have any metal on your body including hair pins and              piercings     Do not wear jewelry, cologne, lotions, powders, or deodorant                    Men may shave face and neck.   Do not bring valuables to the hospital. [place].  Contacts, dentures or bridgework may not be worn into surgery.  Leave suitcase in the car. After surgery it may be brought to your room.      Special Instructions: N/A              Please read over the following fact sheets you were given:MRSA information sheet; joint booklet  _____________________________________________________________________             [facility] - Preparing for Surgery Before surgery, you can play an important role.  Because skin is not sterile, your skin needs to be as free of germs as possible.  You can reduce the number of germs on your skin by washing with CHG (chlorahexidine gluconate) soap before surgery.  CHG is an antiseptic cleaner which kills germs and bonds with the skin to continue killing germs even after washing. Please DO NOT use if you have an allergy to CHG or antibacterial soaps.  If your skin becomes reddened/irritated stop using the CHG and inform your nurse when you arrive at Short Stay. Do not shave (including  legs and underarms) for at least 48 hours prior to the first CHG shower.   You may shave your face/neck. Please follow these instructions carefully:  1.  Shower with CHG Soap the night before surgery and the  morning of Surgery.  2.  If you choose to wash your hair, wash your hair first as usual with your  normal  shampoo.  3.  After you shampoo, rinse your hair and body thoroughly to remove the  shampoo.                           4.  Use CHG as you would any other liquid soap.  You can apply chg directly  to the skin and wash                       Gently with a scrungie or clean washcloth.  5.  Apply the CHG Soap to your body ONLY FROM THE NECK DOWN.   Do not use on face/ open                           Wound or open sores. Avoid contact with eyes, ears mouth and genitals (private parts).                       Wash face,  Genitals (private parts) with your normal soap.             6.  Wash thoroughly, paying special attention to the area where your surgery  will be performed.  7.  Thoroughly rinse your body with warm water from the neck down.  8.  DO NOT shower/wash with your normal soap after using and rinsing off  the CHG Soap.                9.  Pat yourself dry with a clean towel.            10.  Wear clean pajamas.            11.  Place clean sheets on your bed the night of your first shower and do not  sleep with pets. Day of Surgery : Do not apply any lotions/deodorants the morning of surgery.  Please wear clean clothes to the hospital/surgery center.  FAILURE TO FOLLOW THESE INSTRUCTIONS MAY RESULT IN THE CANCELLATION OF YOUR SURGERY PATIENT SIGNATURE_________________________________  NURSE SIGNATURE__________________________________  ________________________________________________________________________   Adam Phenix  An incentive spirometer is a tool that can help keep your lungs clear and active. This tool measures how well you are filling your lungs with each breath. Taking long deep breaths may help reverse or decrease the chance of  developing breathing (pulmonary) problems (especially infection) following:  A long period of time when you are unable to move or be active. BEFORE THE PROCEDURE   If the spirometer includes an indicator to show your best effort, your nurse or respiratory therapist will set it to a desired goal.  If possible, sit up straight or lean slightly forward. Try not to slouch.  Hold the incentive spirometer in an upright position. INSTRUCTIONS FOR USE  1. Sit on the edge of your bed if possible, or sit up as far as you can in bed or on a chair. 2. Hold the incentive spirometer in an upright position. 3. Breathe out normally. 4. Place the mouthpiece in your mouth and  seal your lips tightly around it. 5. Breathe in slowly and as deeply as possible, raising the piston or the ball toward the top of the column. 6. Hold your breath for 3-5 seconds or for as long as possible. Allow the piston or ball to fall to the bottom of the column. 7. Remove the mouthpiece from your mouth and breathe out normally. 8. Rest for a few seconds and repeat Steps 1 through 7 at least 10 times every 1-2 hours when you are awake. Take your time and take a few normal breaths between deep breaths. 9. The spirometer may include an indicator to show your best effort. Use the indicator as a goal to work toward during each repetition. 10. After each set of 10 deep breaths, practice coughing to be sure your lungs are clear. If you have an incision (the cut made at the time of surgery), support your incision when coughing by placing a pillow or rolled up towels firmly against it. Once you are able to get out of bed, walk around indoors and cough well. You may stop using the incentive spirometer when instructed by your caregiver.  RISKS AND COMPLICATIONS  Take your time so you do not get dizzy or light-headed.  If you are in pain, you may need to take or ask for pain medication before doing incentive spirometry. It is harder to take a  deep breath if you are having pain. AFTER USE  Rest and breathe slowly and easily.  It can be helpful to keep track of a log of your progress. Your caregiver can provide you with a simple table to help with this. If you are using the spirometer at home, follow these instructions: [place] IF:   You are having difficultly using the spirometer.  You have trouble using the spirometer as often as instructed.  Your pain medication is not giving enough relief while using the spirometer.  You develop fever of 100.5 F (38.1 C) or higher. SEEK IMMEDIATE MEDICAL CARE IF:   You cough up bloody sputum that had not been present before.  You develop fever of 102 F (38.9 C) or greater.  You develop worsening pain at or near the incision site. MAKE SURE YOU:   Understand these instructions.  Will watch your condition.  Will get help right away if you are not doing well or get worse. Document Released: 03/02/2007 Document Revised: 01/12/2012 Document Reviewed: 05/03/2007 ExitCare Patient Information 2014 ExitCare, [place].   ________________________________________________________________________  WHAT IS A BLOOD TRANSFUSION? Blood Transfusion Information  A transfusion is the replacement of blood or some of its parts. Blood is made up of multiple cells which provide different functions.  Red blood cells carry oxygen and are used for blood loss replacement.  White blood cells fight against infection.  Platelets control bleeding.  Plasma helps clot blood.  Other blood products are available for specialized needs, such as hemophilia or other clotting disorders. BEFORE THE TRANSFUSION  Who gives blood for transfusions?   Healthy volunteers who are fully evaluated to make sure their blood is safe. This is blood bank blood. Transfusion therapy is the safest it has ever been in the practice of medicine. Before blood is taken from a donor, a complete history is taken to make  sure that person has no history of diseases nor engages in risky social behavior (examples are intravenous drug use or sexual activity with multiple partners). The donor's travel history is screened to minimize risk of transmitting infections,  such as malaria. The donated blood is tested for signs of infectious diseases, such as HIV and hepatitis. The blood is then tested to be sure it is compatible with you in order to minimize the chance of a transfusion reaction. If you or a relative donates blood, this is often done in anticipation of surgery and is not appropriate for emergency situations. It takes many days to process the donated blood. RISKS AND COMPLICATIONS Although transfusion therapy is very safe and saves many lives, the main dangers of transfusion include:   Getting an infectious disease.  Developing a transfusion reaction. This is an allergic reaction to something in the blood you were given. Every precaution is taken to prevent this. The decision to have a blood transfusion has been considered carefully by your caregiver before blood is given. Blood is not given unless the benefits outweigh the risks. AFTER THE TRANSFUSION  Right after receiving a blood transfusion, you will usually feel much better and more energetic. This is especially true if your red blood cells have gotten low (anemic). The transfusion raises the level of the red blood cells which carry oxygen, and this usually causes an energy increase.  The nurse administering the transfusion will monitor you carefully for complications. HOME CARE INSTRUCTIONS  No special instructions are needed after a transfusion. You may find your energy is better. Speak with your caregiver about any limitations on activity for underlying diseases you may have. SEEK MEDICAL CARE IF:   Your condition is not improving after your transfusion.  You develop redness or irritation at the intravenous (IV) site. SEEK IMMEDIATE MEDICAL CARE IF:   Any of the following symptoms occur over the next 12 hours:  Shaking chills.  You have a temperature by mouth above 102 F (38.9 C), not controlled by medicine.  Chest, back, or muscle pain.  People around you feel you are not acting correctly or are confused.  Shortness of breath or difficulty breathing.  Dizziness and fainting.  You get a rash or develop hives.  You have a decrease in urine output.  Your urine turns a dark color or changes to pink, red, or brown. Any of the following symptoms occur over the next 10 days:  You have a temperature by mouth above 102 F (38.9 C), not controlled by medicine.  Shortness of breath.  Weakness after normal activity.  The white part of the eye turns yellow (jaundice).  You have a decrease in the amount of urine or are urinating less often.  Your urine turns a dark color or changes to pink, red, or brown. Document Released: 10/17/2000 Document Revised: 01/12/2012 Document Reviewed: 06/05/2008 [facility] Patient Information 2014 [place], [place].  _______________________________________________________________________

## 2019-08-19 ENCOUNTER — Other Ambulatory Visit (HOSPITAL_COMMUNITY)
Admission: RE | Admit: 2019-08-19 | Discharge: 2019-08-19 | Disposition: A | Discharge status: Home / Self Care | Payer: Medicare HMO | Source: Ambulatory Visit | Attending: Orthopedic Surgery | Admitting: Orthopedic Surgery

## 2019-08-19 ENCOUNTER — Encounter (HOSPITAL_COMMUNITY): Payer: Self-pay

## 2019-08-19 ENCOUNTER — Encounter (HOSPITAL_COMMUNITY)
Admission: RE | Admit: 2019-08-19 | Discharge: 2019-08-19 | Disposition: A | Discharge status: Home / Self Care | Payer: Medicare HMO | Source: Ambulatory Visit | Attending: Orthopedic Surgery | Admitting: Orthopedic Surgery

## 2019-08-19 ENCOUNTER — Other Ambulatory Visit: Payer: Self-pay

## 2019-08-19 DIAGNOSIS — Z20828 Contact with and (suspected) exposure to other viral communicable diseases: Secondary | ICD-10-CM | POA: Insufficient documentation

## 2019-08-19 DIAGNOSIS — I4510 Unspecified right bundle-branch block: Secondary | ICD-10-CM | POA: Insufficient documentation

## 2019-08-19 DIAGNOSIS — Z01818 Encounter for other preprocedural examination: Secondary | ICD-10-CM | POA: Insufficient documentation

## 2019-08-19 DIAGNOSIS — E119 Type 2 diabetes mellitus without complications: Secondary | ICD-10-CM | POA: Insufficient documentation

## 2019-08-19 LAB — COMPREHENSIVE METABOLIC PANEL
ALT: 27 U/L (ref 0–44)
AST: 24 U/L (ref 15–41)
Albumin: 2.9 g/dL — ABNORMAL LOW (ref 3.5–5.0)
Alkaline Phosphatase: 58 U/L (ref 38–126)
Anion gap: 8 (ref 5–15)
BUN: 25 mg/dL — ABNORMAL HIGH (ref 8–23)
CO2: 23 mmol/L (ref 22–32)
Calcium: 8.8 mg/dL — ABNORMAL LOW (ref 8.9–10.3)
Chloride: 109 mmol/L (ref 98–111)
Creatinine, Ser: 1.64 mg/dL — ABNORMAL HIGH (ref 0.61–1.24)
GFR calc Af Amer: 45 mL/min — ABNORMAL LOW (ref >60)
GFR calc non Af Amer: 39 mL/min — ABNORMAL LOW (ref >60)
Glucose, Bld: 113 mg/dL — ABNORMAL HIGH (ref 70–99)
Potassium: 4.1 mmol/L (ref 3.5–5.1)
Sodium: 140 mmol/L (ref 135–145)
Total Bilirubin: 0.9 mg/dL (ref 0.3–1.2)
Total Protein: 7.1 g/dL (ref 6.5–8.1)

## 2019-08-19 LAB — CBC WITH DIFFERENTIAL/PLATELET
Abs Immature Granulocytes: 0.02 10*3/uL (ref 0.00–0.07)
Basophils Absolute: 0.1 10*3/uL (ref 0.0–0.1)
Basophils Relative: 1 %
Eosinophils Absolute: 0.1 10*3/uL (ref 0.0–0.5)
Eosinophils Relative: 2 %
HCT: 38.1 % — ABNORMAL LOW (ref 39.0–52.0)
Hemoglobin: 12 g/dL — ABNORMAL LOW (ref 13.0–17.0)
Immature Granulocytes: 0 %
Lymphocytes Relative: 21 %
Lymphs Abs: 1.4 10*3/uL (ref 0.7–4.0)
MCH: 26.8 pg (ref 26.0–34.0)
MCHC: 31.5 g/dL (ref 30.0–36.0)
MCV: 85.2 fL (ref 80.0–100.0)
Monocytes Absolute: 0.9 10*3/uL (ref 0.1–1.0)
Monocytes Relative: 13 %
Neutro Abs: 4.2 10*3/uL (ref 1.7–7.7)
Neutrophils Relative %: 63 %
Platelets: 329 10*3/uL (ref 150–400)
RBC: 4.47 MIL/uL (ref 4.22–5.81)
RDW: 13.3 % (ref 11.5–15.5)
WBC: 6.7 10*3/uL (ref 4.0–10.5)
nRBC: 0 % (ref 0.0–0.2)

## 2019-08-19 LAB — HEMOGLOBIN A1C
Hgb A1c MFr Bld: 6.4 % — ABNORMAL HIGH (ref 4.8–5.6)
Mean Plasma Glucose: 136.98 mg/dL

## 2019-08-19 LAB — PROTIME-INR
INR: 1.2 (ref 0.8–1.2)
Prothrombin Time: 15.4 seconds — ABNORMAL HIGH (ref 11.4–15.2)

## 2019-08-19 LAB — GLUCOSE, CAPILLARY: Glucose-Capillary: 111 mg/dL — ABNORMAL HIGH (ref 70–99)

## 2019-08-19 LAB — APTT: aPTT: 38 seconds — ABNORMAL HIGH (ref 24–36)

## 2019-08-19 LAB — SURGICAL PCR SCREEN
MRSA, PCR: NEGATIVE
Staphylococcus aureus: NEGATIVE

## 2019-08-19 NOTE — Progress Notes (Signed)
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 former smoker with h/o HTN, HLD, OSA on CPAP, CKD Stage III, DM II, GERD, infected right total knee arthroplasty scheduled for above procedure 08/22/2019 with Dr. Gaynelle Arabian.   Per Dr. Renold Don on previous anesthesia note, "Discussed spinal with patient. 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 Patient requests we bypass the spinal option and proceed with general anesthesia"  Per nurse at [facility] his baseline creatinine iaround 1.4.  Last OV note requested.  VS: BP (!) 144/72   Pulse 63   Temp 37.1 C (Oral)   Resp 16   Ht 5' 11.5" (1.816 m)   Wt 98.6 kg   SpO2 98%   BMI 29.90 kg/m   PROVIDERS: Alan Ripper, PA is PCP    LABS: Labs reviewed: Acceptable for surgery. (all labs ordered are listed, but only abnormal results are displayed)  Labs Reviewed  APTT - Abnormal; Notable for the following components:      Result Value   aPTT 38 (*)    All other components within normal limits  CBC WITH DIFFERENTIAL/PLATELET - Abnormal; Notable for the following components:   Hemoglobin 12.0 (*)    HCT 38.1 (*)    All other components within normal limits  COMPREHENSIVE METABOLIC PANEL - Abnormal; Notable for the following components:   Glucose, Bld 113 (*)    BUN 25 (*)    Creatinine, Ser 1.64 (*)    Calcium 8.8 (*)    Albumin 2.9 (*)    GFR calc non Af Amer 39 (*)    GFR calc Af Amer 45 (*)    All other components within normal limits  PROTIME-INR - Abnormal; Notable for  the following components:   Prothrombin Time 15.4 (*)    All other components within normal limits  GLUCOSE, CAPILLARY - Abnormal; Notable for the following components:   Glucose-Capillary 111 (*)    All other components within normal limits  HEMOGLOBIN A1C - Abnormal; Notable for the following components:   Hgb A1c MFr Bld 6.4 (*)    All other components within normal limits  SURGICAL PCR SCREEN  TYPE AND SCREEN     IMAGES:   EKG: 08/19/2019 Rate 58 bpm Sinus bradycardia  Right bundle branch block   CV: Stress Test 01/23/2017 No significant ST segment changes or arrhythmias were noted during  stress  Negative ETT for ischemia at workload achieved  No significant chest pain symptoms reported  No significant arrhythmia noted  Nuclear images report to follow Past Medical History:  Diagnosis Date  . Arthritis   . Cataract   . Chronic kidney disease    Stage 3 per pt  . Diabetes mellitus without complication (HCC)    diet controlled  . Dizziness   . Dry eyes   . Edema    Bilateral legs  . Fecal incontinence   . Generalized weakness   . GERD (gastroesophageal reflux disease)   . Glaucoma   . Headache   . History of chest pain  Saw cardiologist, no blockages noted, possible gas pains  . Hyperlipidemia   . Hypertension   . Idiopathic peripheral neuropathy   . Insomnia   . OSA on CPAP   . Prostate cancer ([place])   . Urinary urgency   . Vitamin D deficiency   . Wears glasses   . Wears hearing aid in both ears   . Wears partial dentures     Past Surgical History:  Procedure Laterality Date  . ANAL RECTAL MANOMETRY  10/08/2018  . ANKLE SURGERY    . BIOPSY THYROID Left 04/08/2015  . CARDIAC CATHETERIZATION    . COLONOSCOPY    . CYSTOSCOPY    . EPIDIDYMIS SURGERY    . HEMORRHOID SURGERY    . PENILE PROSTHESIS IMPLANT    . PROSTATECTOMY    . TOTAL KNEE ARTHROPLASTY Right 10/18/2018   Procedure: RIGHT TOTAL KNEE ARTHROPLASTY;  Surgeon: Gaynelle Arabian,  MD;  Location: [facility];  Service: Orthopedics;  Laterality: Right;  7min    MEDICATIONS: . VITAMIN D, CHOLECALCIFEROL, PO  . acetaminophen (TYLENOL) 325 MG tablet  . amLODipine (NORVASC) 10 MG tablet  . atorvastatin (LIPITOR) 20 MG tablet  . dorzolamide-timolol (COSOPT) 22.3-6.8 MG/ML ophthalmic solution  . latanoprost (XALATAN) 0.005 % ophthalmic solution  . lisinopril (ZESTRIL) 30 MG tablet  . methocarbamol (ROBAXIN) 500 MG tablet  . omeprazole (PRILOSEC) 20 MG capsule  . polyethylene glycol (MIRALAX / GLYCOLAX) packet  . polyvinyl alcohol (LIQUIFILM TEARS) 1.4 % ophthalmic solution  . traMADol (ULTRAM) 50 MG tablet  . vitamin E 400 UNIT capsule   No current facility-administered medications for this encounter.

## 2019-08-21 LAB — NOVEL CORONAVIRUS, NAA (HOSP ORDER, SEND-OUT TO REF LAB; TAT 18-24 HRS): SARS-CoV-2, NAA: NOT DETECTED

## 2019-08-21 MED ORDER — BUPIVACAINE LIPOSOME 1.3 % IJ SUSP
20.0000 mL | Freq: Once | INTRAMUSCULAR | Status: DC
  Filled 2019-08-21: qty 20

## 2019-08-22 ENCOUNTER — Encounter (HOSPITAL_COMMUNITY): Payer: Self-pay | Admitting: *Deleted

## 2019-08-22 ENCOUNTER — Other Ambulatory Visit: Payer: Self-pay

## 2019-08-22 ENCOUNTER — Inpatient Hospital Stay (HOSPITAL_COMMUNITY): Payer: Medicare HMO | Admitting: Physician Assistant

## 2019-08-22 ENCOUNTER — Inpatient Hospital Stay (HOSPITAL_COMMUNITY): Payer: Medicare HMO | Admitting: Anesthesiology

## 2019-08-22 ENCOUNTER — Inpatient Hospital Stay (HOSPITAL_COMMUNITY)
Admission: RE | Admit: 2019-08-22 | Discharge: 2019-08-24 | DRG: 467 | Disposition: A | Discharge status: Home Health | Payer: Medicare HMO | Attending: Orthopedic Surgery | Admitting: Orthopedic Surgery

## 2019-08-22 ENCOUNTER — Encounter (HOSPITAL_COMMUNITY)
Admission: RE | Disposition: A | Discharge status: Home / Self Care | Payer: Self-pay | Source: Home / Self Care | Attending: Orthopedic Surgery

## 2019-08-22 DIAGNOSIS — M009 Pyogenic arthritis, unspecified: Secondary | ICD-10-CM | POA: Diagnosis present

## 2019-08-22 DIAGNOSIS — E1122 Type 2 diabetes mellitus with diabetic chronic kidney disease: Secondary | ICD-10-CM | POA: Diagnosis present

## 2019-08-22 DIAGNOSIS — Z87891 Personal history of nicotine dependence: Secondary | ICD-10-CM

## 2019-08-22 DIAGNOSIS — Z888 Allergy status to other drugs, medicaments and biological substances status: Secondary | ICD-10-CM | POA: Diagnosis not present

## 2019-08-22 DIAGNOSIS — Y831 Surgical operation with implant of artificial internal device as the cause of abnormal reaction of the patient, or of later complication, without mention of misadventure at the time of the procedure: Secondary | ICD-10-CM | POA: Diagnosis present

## 2019-08-22 DIAGNOSIS — E559 Vitamin D deficiency, unspecified: Secondary | ICD-10-CM | POA: Diagnosis present

## 2019-08-22 DIAGNOSIS — N182 Chronic kidney disease, stage 2 (mild): Secondary | ICD-10-CM | POA: Diagnosis present

## 2019-08-22 DIAGNOSIS — Z79899 Other long term (current) drug therapy: Secondary | ICD-10-CM

## 2019-08-22 DIAGNOSIS — E785 Hyperlipidemia, unspecified: Secondary | ICD-10-CM | POA: Diagnosis present

## 2019-08-22 DIAGNOSIS — M1990 Unspecified osteoarthritis, unspecified site: Secondary | ICD-10-CM | POA: Diagnosis present

## 2019-08-22 DIAGNOSIS — Z8546 Personal history of malignant neoplasm of prostate: Secondary | ICD-10-CM | POA: Diagnosis not present

## 2019-08-22 DIAGNOSIS — G4733 Obstructive sleep apnea (adult) (pediatric): Secondary | ICD-10-CM | POA: Diagnosis present

## 2019-08-22 DIAGNOSIS — Z974 Presence of external hearing-aid: Secondary | ICD-10-CM | POA: Diagnosis not present

## 2019-08-22 DIAGNOSIS — T8453XA Infection and inflammatory reaction due to internal right knee prosthesis, initial encounter: Secondary | ICD-10-CM | POA: Diagnosis present

## 2019-08-22 DIAGNOSIS — B954 Other streptococcus as the cause of diseases classified elsewhere: Secondary | ICD-10-CM | POA: Diagnosis not present

## 2019-08-22 DIAGNOSIS — Z20828 Contact with and (suspected) exposure to other viral communicable diseases: Secondary | ICD-10-CM | POA: Diagnosis present

## 2019-08-22 DIAGNOSIS — H409 Unspecified glaucoma: Secondary | ICD-10-CM | POA: Diagnosis present

## 2019-08-22 DIAGNOSIS — M25561 Pain in right knee: Secondary | ICD-10-CM | POA: Diagnosis present

## 2019-08-22 DIAGNOSIS — G609 Hereditary and idiopathic neuropathy, unspecified: Secondary | ICD-10-CM | POA: Diagnosis present

## 2019-08-22 DIAGNOSIS — K219 Gastro-esophageal reflux disease without esophagitis: Secondary | ICD-10-CM | POA: Diagnosis present

## 2019-08-22 DIAGNOSIS — I129 Hypertensive chronic kidney disease with stage 1 through stage 4 chronic kidney disease, or unspecified chronic kidney disease: Secondary | ICD-10-CM | POA: Diagnosis present

## 2019-08-22 DIAGNOSIS — Z880 Allergy status to penicillin: Secondary | ICD-10-CM

## 2019-08-22 DIAGNOSIS — M00261 Other streptococcal arthritis, right knee: Secondary | ICD-10-CM | POA: Diagnosis not present

## 2019-08-22 DIAGNOSIS — Z95828 Presence of other vascular implants and grafts: Secondary | ICD-10-CM | POA: Diagnosis not present

## 2019-08-22 DIAGNOSIS — M00061 Staphylococcal arthritis, right knee: Secondary | ICD-10-CM | POA: Diagnosis present

## 2019-08-22 HISTORY — PX: EXCISIONAL TOTAL KNEE ARTHROPLASTY WITH ANTIBIOTIC SPACERS: SHX5827

## 2019-08-22 LAB — GLUCOSE, CAPILLARY
Glucose-Capillary: 95 mg/dL (ref 70–99)
Glucose-Capillary: 98 mg/dL (ref 70–99)
Glucose-Capillary: 190 mg/dL — ABNORMAL HIGH (ref 70–99)

## 2019-08-22 LAB — TYPE AND SCREEN
ABO/RH(D): O POS
Antibody Screen: NEGATIVE

## 2019-08-22 SURGERY — REMOVAL, TOTAL ARTHROPLASTY HARDWARE, KNEE, WITH ANTIBIOTIC SPACER INSERTION
Anesthesia: Regional | Site: Knee | Laterality: Right

## 2019-08-22 MED ORDER — DEXAMETHASONE SODIUM PHOSPHATE 10 MG/ML IJ SOLN
10.0000 mg | Freq: Once | INTRAMUSCULAR | Status: AC
  Administered 2019-08-23: 10 mg via INTRAVENOUS
  Filled 2019-08-22: qty 1

## 2019-08-22 MED ORDER — DIPHENHYDRAMINE HCL 12.5 MG/5ML PO ELIX: 12.5000 mg | ORAL_SOLUTION | ORAL | Status: DC | PRN

## 2019-08-22 MED ORDER — BISACODYL 10 MG RE SUPP: 10.0000 mg | Freq: Every day | RECTAL | Status: DC | PRN

## 2019-08-22 MED ORDER — FENTANYL CITRATE (PF) 100 MCG/2ML IJ SOLN: 25.0000 ug | INTRAMUSCULAR | Status: DC | PRN

## 2019-08-22 MED ORDER — ROPIVACAINE HCL 5 MG/ML IJ SOLN
INTRAMUSCULAR | Status: DC | PRN
  Administered 2019-08-22: 30 mL via PERINEURAL

## 2019-08-22 MED ORDER — LATANOPROST 0.005 % OP SOLN
1.0000 [drp] | Freq: Every day | OPHTHALMIC | Status: DC
  Administered 2019-08-22 – 2019-08-23 (×2): 1 [drp] via OPHTHALMIC
  Filled 2019-08-22: qty 2.5

## 2019-08-22 MED ORDER — SUGAMMADEX SODIUM 200 MG/2ML IV SOLN
INTRAVENOUS | Status: DC | PRN
  Administered 2019-08-22: 200 mg via INTRAVENOUS

## 2019-08-22 MED ORDER — BUPIVACAINE LIPOSOME 1.3 % IJ SUSP
INTRAMUSCULAR | Status: DC | PRN
  Administered 2019-08-22: 20 mL

## 2019-08-22 MED ORDER — TRAMADOL HCL 50 MG PO TABS
50.0000 mg | ORAL_TABLET | Freq: Four times a day (QID) | ORAL | Status: DC | PRN
  Administered 2019-08-23 – 2019-08-24 (×2): 50 mg via ORAL
  Filled 2019-08-22 (×2): qty 1

## 2019-08-22 MED ORDER — DOCUSATE SODIUM 100 MG PO CAPS
100.0000 mg | ORAL_CAPSULE | Freq: Two times a day (BID) | ORAL | Status: DC
  Administered 2019-08-22 – 2019-08-24 (×4): 100 mg via ORAL
  Filled 2019-08-22 (×4): qty 1

## 2019-08-22 MED ORDER — FLEET ENEMA 7-19 GM/118ML RE ENEM: 1.0000 | ENEMA | Freq: Once | RECTAL | Status: DC | PRN

## 2019-08-22 MED ORDER — TRANEXAMIC ACID-NACL 1000-0.7 MG/100ML-% IV SOLN
1000.0000 mg | INTRAVENOUS | Status: AC
  Administered 2019-08-22: 1000 mg via INTRAVENOUS
  Filled 2019-08-22: qty 100

## 2019-08-22 MED ORDER — DORZOLAMIDE HCL-TIMOLOL MAL 2-0.5 % OP SOLN
1.0000 [drp] | Freq: Two times a day (BID) | OPHTHALMIC | Status: DC
  Administered 2019-08-22 – 2019-08-24 (×4): 1 [drp] via OPHTHALMIC
  Filled 2019-08-22: qty 10

## 2019-08-22 MED ORDER — METOCLOPRAMIDE HCL 5 MG/ML IJ SOLN: 5.0000 mg | Freq: Three times a day (TID) | INTRAMUSCULAR | Status: DC | PRN

## 2019-08-22 MED ORDER — FENTANYL CITRATE (PF) 100 MCG/2ML IJ SOLN
50.0000 ug | INTRAMUSCULAR | Status: AC
  Administered 2019-08-22: 50 ug via INTRAVENOUS
  Filled 2019-08-22: qty 2

## 2019-08-22 MED ORDER — POVIDONE-IODINE 10 % EX SWAB
2.0000 "application " | Freq: Once | CUTANEOUS | Status: AC
  Administered 2019-08-22: 2 via TOPICAL

## 2019-08-22 MED ORDER — METHOCARBAMOL 500 MG PO TABS
500.0000 mg | ORAL_TABLET | Freq: Four times a day (QID) | ORAL | Status: DC | PRN
  Administered 2019-08-22 – 2019-08-24 (×5): 500 mg via ORAL
  Filled 2019-08-22 (×5): qty 1

## 2019-08-22 MED ORDER — BUPIVACAINE HCL 0.25 % IJ SOLN
INTRAMUSCULAR | Status: AC
  Filled 2019-08-22: qty 1

## 2019-08-22 MED ORDER — VANCOMYCIN HCL IN DEXTROSE 1-5 GM/200ML-% IV SOLN
1000.0000 mg | Freq: Once | INTRAVENOUS | Status: AC
  Administered 2019-08-22: 1000 mg via INTRAVENOUS
  Filled 2019-08-22: qty 200

## 2019-08-22 MED ORDER — PROPOFOL 10 MG/ML IV BOLUS
INTRAVENOUS | Status: DC | PRN
  Administered 2019-08-22: 200 mg via INTRAVENOUS

## 2019-08-22 MED ORDER — ATORVASTATIN CALCIUM 20 MG PO TABS
20.0000 mg | ORAL_TABLET | Freq: Every day | ORAL | Status: DC
  Administered 2019-08-23: 20 mg via ORAL
  Filled 2019-08-22: qty 1

## 2019-08-22 MED ORDER — ONDANSETRON HCL 4 MG/2ML IJ SOLN
INTRAMUSCULAR | Status: DC | PRN
  Administered 2019-08-22: 4 mg via INTRAVENOUS

## 2019-08-22 MED ORDER — LIDOCAINE HCL (CARDIAC) PF 100 MG/5ML IV SOSY
PREFILLED_SYRINGE | INTRAVENOUS | Status: DC | PRN
  Administered 2019-08-22: 40 mg via INTRAVENOUS

## 2019-08-22 MED ORDER — OXYCODONE HCL 5 MG PO TABS: 5.0000 mg | ORAL_TABLET | ORAL | Status: DC | PRN

## 2019-08-22 MED ORDER — VANCOMYCIN HCL IN DEXTROSE 1-5 GM/200ML-% IV SOLN
1000.0000 mg | INTRAVENOUS | Status: AC
  Administered 2019-08-22: 1000 mg via INTRAVENOUS
  Filled 2019-08-22: qty 200

## 2019-08-22 MED ORDER — MENTHOL 3 MG MT LOZG: 1.0000 | LOZENGE | OROMUCOSAL | Status: DC | PRN

## 2019-08-22 MED ORDER — MIDAZOLAM HCL 2 MG/2ML IJ SOLN
1.0000 mg | INTRAMUSCULAR | Status: DC
  Filled 2019-08-22: qty 2

## 2019-08-22 MED ORDER — METHOCARBAMOL 500 MG IVPB - SIMPLE MED
500.0000 mg | Freq: Four times a day (QID) | INTRAVENOUS | Status: DC | PRN
  Filled 2019-08-22: qty 50

## 2019-08-22 MED ORDER — LACTATED RINGERS IV SOLN
INTRAVENOUS | Status: DC
  Administered 2019-08-22 (×2): via INTRAVENOUS

## 2019-08-22 MED ORDER — DEXAMETHASONE SODIUM PHOSPHATE 10 MG/ML IJ SOLN
INTRAMUSCULAR | Status: DC | PRN
  Administered 2019-08-22: 10 mg via INTRAVENOUS

## 2019-08-22 MED ORDER — BUPIVACAINE HCL (PF) 0.25 % IJ SOLN
INTRAMUSCULAR | Status: AC
  Filled 2019-08-22: qty 30

## 2019-08-22 MED ORDER — MORPHINE SULFATE (PF) 2 MG/ML IV SOLN: 1.0000 mg | INTRAVENOUS | Status: DC | PRN

## 2019-08-22 MED ORDER — ONDANSETRON HCL 4 MG/2ML IJ SOLN: 4.0000 mg | Freq: Four times a day (QID) | INTRAMUSCULAR | Status: DC | PRN

## 2019-08-22 MED ORDER — SODIUM CHLORIDE 0.9 % IR SOLN
Status: DC | PRN
  Administered 2019-08-22: 3000 mL

## 2019-08-22 MED ORDER — POLYVINYL ALCOHOL 1.4 % OP SOLN
1.0000 [drp] | OPHTHALMIC | Status: DC | PRN
  Filled 2019-08-22: qty 15

## 2019-08-22 MED ORDER — ONDANSETRON HCL 4 MG/2ML IJ SOLN
INTRAMUSCULAR | Status: AC
  Filled 2019-08-22: qty 2

## 2019-08-22 MED ORDER — AMLODIPINE BESYLATE 10 MG PO TABS
10.0000 mg | ORAL_TABLET | Freq: Every day | ORAL | Status: DC
  Administered 2019-08-23 – 2019-08-24 (×2): 10 mg via ORAL
  Filled 2019-08-22 (×2): qty 1

## 2019-08-22 MED ORDER — SODIUM CHLORIDE 0.9 % IV SOLN
INTRAVENOUS | Status: DC
  Administered 2019-08-22: 20:00:00 via INTRAVENOUS

## 2019-08-22 MED ORDER — ACETAMINOPHEN 10 MG/ML IV SOLN
1000.0000 mg | Freq: Four times a day (QID) | INTRAVENOUS | Status: DC
  Administered 2019-08-22: 1000 mg via INTRAVENOUS
  Filled 2019-08-22 (×2): qty 100

## 2019-08-22 MED ORDER — STERILE WATER FOR IRRIGATION IR SOLN
Status: DC | PRN
  Administered 2019-08-22: 2000 mL

## 2019-08-22 MED ORDER — POLYETHYLENE GLYCOL 3350 17 G PO PACK: 17.0000 g | PACK | Freq: Every day | ORAL | Status: DC | PRN

## 2019-08-22 MED ORDER — METOCLOPRAMIDE HCL 5 MG PO TABS: 5.0000 mg | ORAL_TABLET | Freq: Three times a day (TID) | ORAL | Status: DC | PRN

## 2019-08-22 MED ORDER — ROCURONIUM BROMIDE 100 MG/10ML IV SOLN
INTRAVENOUS | Status: DC | PRN
  Administered 2019-08-22: 40 mg via INTRAVENOUS

## 2019-08-22 MED ORDER — PROPOFOL 10 MG/ML IV BOLUS
INTRAVENOUS | Status: AC
  Filled 2019-08-22: qty 20

## 2019-08-22 MED ORDER — FENTANYL CITRATE (PF) 250 MCG/5ML IJ SOLN
INTRAMUSCULAR | Status: AC
  Filled 2019-08-22: qty 5

## 2019-08-22 MED ORDER — FENTANYL CITRATE (PF) 250 MCG/5ML IJ SOLN
INTRAMUSCULAR | Status: DC | PRN
  Administered 2019-08-22 (×3): 50 ug via INTRAVENOUS
  Administered 2019-08-22: 100 ug via INTRAVENOUS

## 2019-08-22 MED ORDER — VANCOMYCIN HCL IN DEXTROSE 750-5 MG/150ML-% IV SOLN: 750.0000 mg | INTRAVENOUS | Status: DC

## 2019-08-22 MED ORDER — CHLORHEXIDINE GLUCONATE 4 % EX LIQD: 60.0000 mL | Freq: Once | CUTANEOUS | Status: DC

## 2019-08-22 MED ORDER — ASPIRIN EC 325 MG PO TBEC
325.0000 mg | DELAYED_RELEASE_TABLET | Freq: Two times a day (BID) | ORAL | Status: DC
  Administered 2019-08-23 – 2019-08-24 (×3): 325 mg via ORAL
  Filled 2019-08-22 (×3): qty 1

## 2019-08-22 MED ORDER — ACETAMINOPHEN 500 MG PO TABS
1000.0000 mg | ORAL_TABLET | Freq: Four times a day (QID) | ORAL | Status: AC
  Administered 2019-08-22 – 2019-08-23 (×4): 1000 mg via ORAL
  Filled 2019-08-22 (×4): qty 2

## 2019-08-22 MED ORDER — 0.9 % SODIUM CHLORIDE (POUR BTL) OPTIME
TOPICAL | Status: DC | PRN
  Administered 2019-08-22: 17:00:00 1000 mL

## 2019-08-22 MED ORDER — PHENOL 1.4 % MT LIQD: 1.0000 | OROMUCOSAL | Status: DC | PRN

## 2019-08-22 MED ORDER — ONDANSETRON HCL 4 MG PO TABS: 4.0000 mg | ORAL_TABLET | Freq: Four times a day (QID) | ORAL | Status: DC | PRN

## 2019-08-22 MED ORDER — ONDANSETRON HCL 4 MG/2ML IJ SOLN: 4.0000 mg | Freq: Once | INTRAMUSCULAR | Status: DC | PRN

## 2019-08-22 MED ORDER — PANTOPRAZOLE SODIUM 40 MG PO TBEC
40.0000 mg | DELAYED_RELEASE_TABLET | Freq: Every day | ORAL | Status: DC
  Administered 2019-08-23 – 2019-08-24 (×2): 40 mg via ORAL
  Filled 2019-08-22 (×2): qty 1

## 2019-08-22 MED ORDER — BUPIVACAINE HCL (PF) 0.25 % IJ SOLN
INTRAMUSCULAR | Status: DC | PRN
  Administered 2019-08-22: 20 mL via INTRA_ARTICULAR

## 2019-08-22 SURGICAL SUPPLY — 61 items
BAG ZIPLOCK 12X15 (MISCELLANEOUS) ×3 IMPLANT
BANDAGE ESMARK 6X9 LF (GAUZE/BANDAGES/DRESSINGS) ×1 IMPLANT
BLADE SAG 18X100X1.27 (BLADE) ×3 IMPLANT
BLADE SAW SGTL 11.0X1.19X90.0M (BLADE) ×3 IMPLANT
BNDG ELASTIC 6X5.8 VLCR STR LF (GAUZE/BANDAGES/DRESSINGS) ×3 IMPLANT
BNDG ESMARK 6X9 LF (GAUZE/BANDAGES/DRESSINGS) ×3
BONE CEMENT GENTAMICIN (Cement) ×6 IMPLANT
CATH FOLEY 2WAY SLVR  5CC 16FR (CATHETERS)
CATH FOLEY 2WAY SLVR 5CC 16FR (CATHETERS) IMPLANT
CEMENT BONE GENTAMICIN 40 (Cement) ×2 IMPLANT
COVER SURGICAL LIGHT HANDLE (MISCELLANEOUS) ×3 IMPLANT
COVER WAND RF STERILE (DRAPES) IMPLANT
CUFF TOURN SGL QUICK 34 (TOURNIQUET CUFF) ×4
CUFF TRNQT CYL 34X4.125X (TOURNIQUET CUFF) ×2 IMPLANT
DECANTER SPIKE VIAL GLASS SM (MISCELLANEOUS) ×3 IMPLANT
DRAPE POUCH INSTRU U-SHP 10X18 (DRAPES) ×3 IMPLANT
DRAPE SHEET LG 3/4 BI-LAMINATE (DRAPES) ×3 IMPLANT
DRAPE U-SHAPE 47X51 STRL (DRAPES) ×3 IMPLANT
DRSG ADAPTIC 3X8 NADH LF (GAUZE/BANDAGES/DRESSINGS) ×3 IMPLANT
DRSG PAD ABDOMINAL 8X10 ST (GAUZE/BANDAGES/DRESSINGS) ×6 IMPLANT
DURAPREP 26ML APPLICATOR (WOUND CARE) ×3 IMPLANT
ELECT REM PT RETURN 15FT ADLT (MISCELLANEOUS) ×3 IMPLANT
EVACUATOR 1/8 PVC DRAIN (DRAIN) ×3 IMPLANT
FACESHIELD WRAPAROUND (MASK) ×15 IMPLANT
FEMUR SIGMA PS SZ 5.0 R (Femur) ×3 IMPLANT
GAUZE SPONGE 4X4 12PLY STRL (GAUZE/BANDAGES/DRESSINGS) ×3 IMPLANT
GLOVE BIO SURGEON STRL SZ 6 (GLOVE) ×3 IMPLANT
GLOVE BIO SURGEON STRL SZ7.5 (GLOVE) ×3 IMPLANT
GLOVE BIO SURGEON STRL SZ8 (GLOVE) ×6 IMPLANT
GLOVE BIOGEL PI IND STRL 7.0 (GLOVE) ×1 IMPLANT
GLOVE BIOGEL PI IND STRL 8 (GLOVE) ×1 IMPLANT
GLOVE BIOGEL PI INDICATOR 7.0 (GLOVE) ×2
GLOVE BIOGEL PI INDICATOR 8 (GLOVE) ×2
GLOVE SURG SS PI 7.0 STRL IVOR (GLOVE) ×3 IMPLANT
GOWN STRL REUS W/TWL LRG LVL3 (GOWN DISPOSABLE) ×9 IMPLANT
HANDPIECE INTERPULSE COAX TIP (DISPOSABLE) ×2
IMMOBILIZER KNEE 20 (SOFTGOODS)
IMMOBILIZER KNEE 20 THIGH 36 (SOFTGOODS) IMPLANT
INSERT TIB SIGMA 5X20 (Knees) ×3 IMPLANT
KIT TURNOVER KIT A (KITS) IMPLANT
MANIFOLD NEPTUNE II (INSTRUMENTS) ×3 IMPLANT
NS IRRIG 1000ML POUR BTL (IV SOLUTION) ×6 IMPLANT
PADDING CAST COTTON 6X4 STRL (CAST SUPPLIES) ×6 IMPLANT
PROTECTOR NERVE ULNAR (MISCELLANEOUS) ×3 IMPLANT
SET HNDPC FAN SPRY TIP SCT (DISPOSABLE) ×1 IMPLANT
STAPLER VISISTAT 35W (STAPLE) ×6 IMPLANT
SUCTION FRAZIER HANDLE 10FR (MISCELLANEOUS) ×2
SUCTION TUBE FRAZIER 10FR DISP (MISCELLANEOUS) ×1 IMPLANT
SUT PDS AB 1 CT1 27 (SUTURE) ×3 IMPLANT
SUT VIC AB 1 CT1 27 (SUTURE) ×6
SUT VIC AB 1 CT1 27XBRD ANTBC (SUTURE) ×3 IMPLANT
SUT VIC AB 2-0 CT1 27 (SUTURE) ×6
SUT VIC AB 2-0 CT1 TAPERPNT 27 (SUTURE) ×3 IMPLANT
SWAB COLLECTION DEVICE MRSA (MISCELLANEOUS) ×3 IMPLANT
SWAB CULTURE ESWAB REG 1ML (MISCELLANEOUS) ×3 IMPLANT
TOWEL OR 17X26 10 PK STRL BLUE (TOWEL DISPOSABLE) ×3 IMPLANT
TOWER CARTRIDGE SMART MIX (DISPOSABLE) ×3 IMPLANT
TRAY FOLEY MTR SLVR 16FR STAT (SET/KITS/TRAYS/PACK) ×3 IMPLANT
WATER STERILE IRR 1000ML POUR (IV SOLUTION) ×3 IMPLANT
WRAP KNEE MAXI GEL POST OP (GAUZE/BANDAGES/DRESSINGS) ×6 IMPLANT
YANKAUER SUCT BULB TIP 10FT TU (MISCELLANEOUS) ×3 IMPLANT

## 2019-08-22 NOTE — Anesthesia Preprocedure Evaluation (Addendum)
Anesthesia Evaluation  Patient identified by MRN, date of birth, ID band Patient awake    Reviewed: Allergy & Precautions, NPO status , Patient's Chart, lab work & pertinent test results  Airway Mallampati: II  TM Distance: >3 FB Neck ROM: Full    Dental  (+) Partial Upper, Partial Lower   Pulmonary sleep apnea and Continuous Positive Airway Pressure Ventilation , former smoker,    Pulmonary exam normal breath sounds clear to auscultation       Cardiovascular hypertension, Pt. on medications Normal cardiovascular exam Rhythm:Regular Rate:Normal  ECG: rate 58. Sinus bradycardia Right bundle branch block   Neuro/Psych  Headaches, Idiopathic peripheral neuropathy  Neuromuscular disease negative psych ROS   GI/Hepatic Neg liver ROS, GERD  Medicated and Controlled,  Endo/Other  diabetes  Renal/GU Renal InsufficiencyRenal disease     Musculoskeletal  (+) Arthritis , Ambulates with cane   Abdominal   Peds  Hematology  (+) anemia , HLD   Anesthesia Other Findings Infected right total knee arthroplasty  Reproductive/Obstetrics                            Anesthesia Physical Anesthesia Plan  ASA: II  Anesthesia Plan: General and Regional   Post-op Pain Management: GA combined w/ Regional for post-op pain   Induction: Intravenous  PONV Risk Score and Plan: 2 and Ondansetron, Dexamethasone and Treatment may vary due to age or medical condition  Airway Management Planned: Oral ETT  Additional Equipment:   Intra-op Plan:   Post-operative Plan: Extubation in OR  Informed Consent: I have reviewed the patients History and Physical, chart, labs and discussed the procedure including the risks, benefits and alternatives for the proposed anesthesia with the patient or authorized representative who has indicated his/her understanding and acceptance.     Dental advisory given  Plan Discussed  with: CRNA  Anesthesia Plan Comments:         Anesthesia Quick Evaluation

## 2019-08-22 NOTE — Discharge Instructions (Signed)
° °Dr. Frank Aluisio °Total Joint Specialist °[facility] °[address] °[place],  [address] °[phone] ° ° KNEE POSTOPERATIVE DIRECTIONS ° °Knee Rehabilitation, Guidelines Following Surgery  °Results after knee surgery are often greatly improved when you follow the exercise, range of motion and muscle strengthening exercises prescribed by your doctor. Safety measures are also important to protect the knee from further injury. Any time any of these exercises cause you to have increased pain or swelling in your knee joint, decrease the amount until you are comfortable again and slowly increase them. If you have problems or questions, call your caregiver or physical therapist for advice.  ° °HOME CARE INSTRUCTIONS  °Remove items at home which could result in a fall. This includes throw rugs or furniture in walking pathways.  °· ICE to the affected knee every three hours for 30 minutes at a time and then as needed for pain and swelling.  Continue to use ice on the knee for pain and swelling from surgery. You may notice swelling that will progress down to the foot and ankle.  This is normal after surgery.  Elevate the leg when you are not up walking on it.   °· Continue to use the breathing machine which will help keep your temperature down.  It is common for your temperature to cycle up and down following surgery, especially at night when you are not up moving around and exerting yourself.  The breathing machine keeps your lungs expanded and your temperature down. °· Do not place pillow under knee, focus on keeping the knee straight while resting ° °DIET °You may resume your previous home diet once your are discharged from the hospital. ° °DRESSING / WOUND CARE / SHOWERING °You may shower 3 days after surgery, but keep the wounds dry during showering.  You may use an occlusive plastic wrap (Press'n Seal for example), NO SOAKING/SUBMERGING IN THE BATHTUB.  If the bandage gets wet, change with  a clean dry gauze.  If the incision gets wet, pat the wound dry with a clean towel. °You may start showering once you are discharged home but do not submerge the incision under water. Just pat the incision dry and apply a dry gauze dressing on daily. °Change the surgical dressing daily and reapply a dry dressing each time. ° °ACTIVITY °Walk with your walker as instructed. °Use walker as long as suggested by your caregivers. °Avoid periods of inactivity such as sitting longer than an hour when not asleep. This helps prevent blood clots.  °You may resume a sexual relationship in one month or when given the OK by your doctor.  °You may return to work once you are cleared by your doctor.  °Do not drive a car for 6 weeks or until released by you surgeon.  °Do not drive while taking narcotics. ° °WEIGHT BEARING °Weight bearing as tolerated with assist device (walker, cane, etc) as directed, use it as long as suggested by your surgeon or therapist, typically at least 4-6 weeks. ° °POSTOPERATIVE CONSTIPATION PROTOCOL °Constipation - defined medically as fewer than three stools per week and severe constipation as less than one stool per week. ° °One of the most common issues patients have following surgery is constipation.  Even if you have a regular bowel pattern at home, your normal regimen is likely to be disrupted due to multiple reasons following surgery.  Combination of anesthesia, postoperative narcotics, change in appetite and fluid intake all can affect your bowels.  In   order to avoid complications following surgery, here are some recommendations in order to help you during your recovery period. ° °Colace (docusate) - Pick up an over-the-counter form of Colace or another stool softener and take twice a day as long as you are requiring postoperative pain medications.  Take with a full glass of water daily.  If you experience loose stools or diarrhea, hold the colace until you stool forms back up.  If your symptoms do  not get better within 1 week or if they get worse, check with your doctor. ° °Dulcolax (bisacodyl) - Pick up over-the-counter and take as directed by the product packaging as needed to assist with the movement of your bowels.  Take with a full glass of water.  Use this product as needed if not relieved by Colace only.  ° °MiraLax (polyethylene glycol) - Pick up over-the-counter to have on hand.  MiraLax is a solution that will increase the amount of water in your bowels to assist with bowel movements.  Take as directed and can mix with a glass of water, juice, soda, coffee, or tea.  Take if you go more than two days without a movement. °Do not use MiraLax more than once per day. Call your doctor if you are still constipated or irregular after using this medication for 7 days in a row. ° °If you continue to have problems with postoperative constipation, please contact the office for further assistance and recommendations.  If you experience "the worst abdominal pain ever" or develop nausea or vomiting, please contact the office immediatly for further recommendations for treatment. ° °ITCHING ° If you experience itching with your medications, try taking only a single pain pill, or even half a pain pill at a time.  You can also use Benadryl over the counter for itching or also to help with sleep.  ° °TED HOSE STOCKINGS °Wear the elastic stockings on both legs for three weeks following surgery during the day but you may remove then at night for sleeping. ° °MEDICATIONS °See your medication summary on the “After Visit Summary” that the nursing staff will review with you prior to discharge.  You may have some home medications which will be placed on hold until you complete the course of blood thinner medication.  It is important for you to complete the blood thinner medication as prescribed by your surgeon.  Continue your approved medications as instructed at time of discharge. ° °PRECAUTIONS °If you experience chest pain  or shortness of breath - call 911 immediately for transfer to the hospital emergency department.  °If you develop a fever greater that 101 F, purulent drainage from wound, increased redness or drainage from wound, foul odor from the wound/dressing, or calf pain - CONTACT YOUR SURGEON.   °                                                °FOLLOW-UP APPOINTMENTS °Make sure you keep all of your appointments after your operation with your surgeon and caregivers. You should call the office at the above phone number and make an appointment for approximately two weeks after the date of your surgery or on the date instructed by your surgeon outlined in the "After Visit Summary". ° ° °RANGE OF MOTION AND STRENGTHENING EXERCISES  °Rehabilitation of the knee is important following a knee injury or an   operation. After just a few days of immobilization, the muscles of the thigh which control the knee become weakened and shrink (atrophy). Knee exercises are designed to build up the tone and strength of the thigh muscles and to improve knee motion. Often times heat used for twenty to thirty minutes before working out will loosen up your tissues and help with improving the range of motion but do not use heat for the first two weeks following surgery. These exercises can be done on a training (exercise) mat, on the floor, on a table or on a bed. Use what ever works the best and is most comfortable for you Knee exercises include:  °Leg Lifts - While your knee is still immobilized in a splint or cast, you can do straight leg raises. Lift the leg to 60 degrees, hold for 3 sec, and slowly lower the leg. Repeat 10-20 times 2-3 times daily. Perform this exercise against resistance later as your knee gets better.  °Quad and Hamstring Sets - Tighten up the muscle on the front of the thigh (Quad) and hold for 5-10 sec. Repeat this 10-20 times hourly. Hamstring sets are done by pushing the foot backward against an object and holding for 5-10  sec. Repeat as with quad sets.  °· Leg Slides: Lying on your back, slowly slide your foot toward your buttocks, bending your knee up off the floor (only go as far as is comfortable). Then slowly slide your foot back down until your leg is flat on the floor again. °· Angel Wings: Lying on your back spread your legs to the side as far apart as you can without causing discomfort.  °A rehabilitation program following serious knee injuries can speed recovery and prevent re-injury in the future due to weakened muscles. Contact your doctor or a physical therapist for more information on knee rehabilitation.  ° °IF YOU ARE TRANSFERRED TO A SKILLED REHAB FACILITY °If the patient is transferred to a skilled rehab facility following release from the hospital, a list of the current medications will be sent to the facility for the patient to continue.  When discharged from the skilled rehab facility, please have the facility set up the patient's Home Health Physical Therapy prior to being released. Also, the skilled facility will be responsible for providing the patient with their medications at time of release from the facility to include their pain medication, the muscle relaxants, and their blood thinner medication. If the patient is still at the rehab facility at time of the two week follow up appointment, the skilled rehab facility will also need to assist the patient in arranging follow up appointment in our office and any transportation needs. ° °MAKE SURE YOU:  °Understand these instructions.  °Get help right away if you are not doing well or get worse.  ° ° °Pick up stool softner and laxative for home use following surgery while on pain medications. °Do not submerge incision under water. °Please use good hand washing techniques while changing dressing each day. °May shower starting three days after surgery. °Please use a clean towel to pat the incision dry following showers. °Continue to use ice for pain and swelling  after surgery. °Do not use any lotions or creams on the incision until instructed by your surgeon. °

## 2019-08-22 NOTE — Anesthesia Postprocedure Evaluation (Signed)
Anesthesia Post Note  Patient: Drew Hudson  Procedure(s) Performed: Right knee resection arthroplasty; antibiotic spacer (Right Knee)     Patient location during evaluation: PACU Anesthesia Type: Regional and General Level of consciousness: awake Pain management: pain level controlled Vital Signs Assessment: post-procedure vital signs reviewed and stable Respiratory status: spontaneous breathing, nonlabored ventilation, respiratory function stable and patient connected to nasal cannula oxygen Cardiovascular status: blood pressure returned to baseline and stable Postop Assessment: no apparent nausea or vomiting Anesthetic complications: no    Last Vitals:  Vitals:   08/22/19 1915 08/22/19 1931  BP:  (!) 141/74  Pulse: 67 69  Resp:  16  Temp: 37.2 C 37 C  SpO2: 96% 99%    Last Pain:  Vitals:   08/22/19 1931  TempSrc: Oral  PainSc:                  Ryan P Ellender

## 2019-08-22 NOTE — Transfer of Care (Signed)
Immediate Anesthesia Transfer of Care Note  Patient: Drew Hudson  Procedure(s) Performed: Procedure(s) with comments: Right knee resection arthroplasty; antibiotic spacer (Right) - 55min  Patient Location: PACU  Anesthesia Type:General  Level of Consciousness:  sedated, patient cooperative and responds to stimulation  Airway & Oxygen Therapy:Patient Spontanous Breathing and Patient connected to face mask oxgen  Post-op Assessment:  Report given to PACU RN and Post -op Vital signs reviewed and stable  Post vital signs:  Reviewed and stable  Last Vitals:  Vitals:   08/22/19 1529 08/22/19 1534  BP: (!) 142/74 (!) 147/77  Pulse: 66 67  Resp: 17 20  Temp:    SpO2: [id number] [id number]    Complications: No apparent anesthesia complications

## 2019-08-22 NOTE — Progress Notes (Signed)
AssistedDr. Ellender with right, ultrasound guided, adductor canal block. Side rails up, monitors on throughout procedure. See vital signs in flow sheet. Tolerated Procedure well.  

## 2019-08-22 NOTE — Brief Op Note (Signed)
08/22/2019  5:49 PM  PATIENT:  Drew Hudson  83 y.o. male  PRE-OPERATIVE DIAGNOSIS:  Infected right total knee arthroplasty  POST-OPERATIVE DIAGNOSIS:  Infected right total knee arthroplasty  PROCEDURE:  Procedure(s) with comments: Right knee resection arthroplasty; antibiotic spacer (Right) - 73min  SURGEON:  Surgeon(s) and Role:    Gaynelle Arabian, MD - Primary  PHYSICIAN ASSISTANT:   ASSISTANTS: Griffith Citron, PA-C   ANESTHESIA:   general  EBL:  100 mL   BLOOD ADMINISTERED:none  DRAINS: (Medium) Hemovact drain(s) in the rght knee with  Suction Open   LOCAL MEDICATIONS USED:  OTHER Exparel  COUNTS:  YES  TOURNIQUET:   Total Tourniquet Time Documented: Thigh (Right) - 52 minutes Total: Thigh (Right) - 52 minutes   DICTATION: .Other Dictation: Dictation Number [id number]  PLAN OF CARE: Admit to inpatient   PATIENT DISPOSITION:  PACU - hemodynamically stable.

## 2019-08-22 NOTE — Anesthesia Procedure Notes (Signed)
Anesthesia Regional Block: Adductor canal block   Pre-Anesthetic Checklist: ,, timeout performed, Correct Patient, Correct Site, Correct Laterality, Correct Procedure,, site marked, risks and benefits discussed, Surgical consent,  Pre-op evaluation,  At surgeon's request and post-op pain management  Laterality: Right  Prep: chloraprep       Needles:  Injection technique: Single-shot  Needle Type: Echogenic Stimulator Needle     Needle Length: 9cm  Needle Gauge: 21     Additional Needles:   Procedures:,,,, ultrasound used (permanent image in chart),,,,  Narrative:  Start time: 08/22/2019 3:10 PM End time: 08/22/2019 3:20 PM Injection made incrementally with aspirations every 5 mL.  Performed by: Personally  Anesthesiologist: Murvin Natal, MD  Additional Notes: Functioning IV was confirmed and monitors were applied. A time-out was performed. Hand hygiene and sterile gloves were used. The thigh was placed in a frog-leg position and prepped in a sterile fashion. A 60mm 21ga Arrow echogenic stimulator needle was placed using ultrasound guidance.  Negative aspiration and negative test dose prior to incremental administration of local anesthetic. The patient tolerated the procedure well.

## 2019-08-22 NOTE — Anesthesia Procedure Notes (Signed)

## 2019-08-22 NOTE — Progress Notes (Signed)
RT inspected pt's CPAP equipment from home for damages/defects, none were found.  Pt's machine is working properly at this time.  Pt will self administer when ready for bed.  RT to monitor and assess as needed.

## 2019-08-22 NOTE — Progress Notes (Signed)
Pharmacy Antibiotic Note  Drew Hudson is a 83 y.o. male admitted on 08/22/2019 with prosthetic joint infection.  Pharmacy has been consulted for vancomycin dosing.  Pt underwent right knee resection arthroplasty with antibiotic spacer on 10/19.   Today, 08/22/19  WBC 6.7 (10/16)  SCr 1.63 (10/16), CrCl ~ 43 mL/min  Afebrile  Plan:  Patient received vancomycin 1000 mg IV dose this afternoon. Will give another 1000 mg dose now for a total LD of 2000 mg.   Maintenance dose of vancomycin 750 mg IV q24h  Goal vancomycin AUC 400-550  SCr with AM labs tomorrow. Follow renal function  Check vancomycin levels once at steady state if indicated  Height: 5' 11.5" (181.6 cm) Weight: 217 lb 6.4 oz (98.6 kg) IBW/kg (Calculated) : 76.45  Temp (24hrs), Avg:98.7 F (37.1 C), Min:98.3 F (36.8 C), Max:99 F (37.2 C)  Recent Labs  Lab 08/19/19 0937  WBC 6.7  CREATININE 1.64*    Estimated Creatinine Clearance: 42.6 mL/min (A) (by C-G formula based on SCr of 1.64 mg/dL (H)).    Allergies  Allergen Reactions  . Lovastatin Itching  . Penicillins Rash    Has patient had a PCN reaction causing immediate rash, facial/tongue/throat swelling, SOB or lightheadedness with hypotension: No Has patient had a PCN reaction causing severe rash involving mucus membranes or skin necrosis: No Has patient had a PCN reaction that required hospitalization: No Has patient had a PCN reaction occurring within the last 10 years: Yes If all of the above answers are "NO", then may proceed with Cephalosporin use.     Antimicrobials this admission: vancomycin 10/19 >>   Dose adjustments this admission:  Microbiology results: None obtained  Thank you for allowing pharmacy to be a part of this patient's care.  Lenis Noon, PharmD 08/22/2019 7:59 PM

## 2019-08-22 NOTE — H&P (Signed)
Drew Hudson is an 83 y.o. male.   Chief Complaint: Right knee periprosthetic joint infection HPI: Mr Tippens is an 83 yo male who had a right TKA approximately 10 months ago and was doing great until 3 weeks ago when he had the sudden onset of right knee pain and swelling. He developed worsening pain and felt worse last week and came into the office for evaluation. He had an aspiration which revealed purulent fluid consistent with periprosthetic joint infection. He presents today for resection arthroplasty and antibiotic spacer placement  Past Medical History:  Diagnosis Date  . Arthritis   . Cataract   . Chronic kidney disease    Stage 3 per pt  . Diabetes mellitus without complication (HCC)    diet controlled  . Dizziness   . Dry eyes   . Edema    Bilateral legs  . Fecal incontinence   . Generalized weakness   . GERD (gastroesophageal reflux disease)   . Glaucoma   . Headache   . History of chest pain    Saw cardiologist, no blockages noted, possible gas pains  . Hyperlipidemia   . Hypertension   . Idiopathic peripheral neuropathy   . Insomnia   . OSA on CPAP   . Prostate cancer ([place])   . Urinary urgency   . Vitamin D deficiency   . Wears glasses   . Wears hearing aid in both ears   . Wears partial dentures     Past Surgical History:  Procedure Laterality Date  . ANAL RECTAL MANOMETRY  10/08/2018  . ANKLE SURGERY    . BIOPSY THYROID Left 04/08/2015  . CARDIAC CATHETERIZATION    . COLONOSCOPY    . CYSTOSCOPY    . EPIDIDYMIS SURGERY    . HEMORRHOID SURGERY    . PENILE PROSTHESIS IMPLANT    . PROSTATECTOMY    . TOTAL KNEE ARTHROPLASTY Right 10/18/2018   Procedure: RIGHT TOTAL KNEE ARTHROPLASTY;  Surgeon: Gaynelle Arabian, MD;  Location: [facility];  Service: Orthopedics;  Laterality: Right;  65min    History reviewed. No pertinent family history. Social History:  reports that he has quit smoking. He has never used smokeless tobacco. He reports that he does not drink  alcohol or use drugs.  Allergies:  Allergies  Allergen Reactions  . Lovastatin Itching  . Penicillins Rash    Has patient had a PCN reaction causing immediate rash, facial/tongue/throat swelling, SOB or lightheadedness with hypotension: No Has patient had a PCN reaction causing severe rash involving mucus membranes or skin necrosis: No Has patient had a PCN reaction that required hospitalization: No Has patient had a PCN reaction occurring within the last 10 years: Yes If all of the above answers are "NO", then may proceed with Cephalosporin use.     Medications Prior to Admission  Medication Sig Dispense Refill  . acetaminophen (TYLENOL) 325 MG tablet Take 650 mg by mouth every 6 (six) hours as needed for mild pain or headache.    Marland Kitchen amLODipine (NORVASC) 10 MG tablet Take 10 mg by mouth daily.  2  . atorvastatin (LIPITOR) 20 MG tablet Take 20 mg by mouth daily.  0  . dorzolamide-timolol (COSOPT) 22.3-6.8 MG/ML ophthalmic solution Place 1 drop into both eyes 2 (two) times daily.  0  . latanoprost (XALATAN) 0.005 % ophthalmic solution Place 1 drop into both eyes every morning.  0  . lisinopril (ZESTRIL) 30 MG tablet Take 30 mg by mouth daily.    Marland Kitchen omeprazole (PRILOSEC)  20 MG capsule Take 20 mg by mouth daily.  3  . polyvinyl alcohol (LIQUIFILM TEARS) 1.4 % ophthalmic solution Place 1 drop into both eyes as needed for dry eyes.    Marland Kitchen VITAMIN D, CHOLECALCIFEROL, PO Take 1 tablet by mouth 2 (two) times daily.    . vitamin E 400 UNIT capsule Take 400 Units by mouth daily.    . methocarbamol (ROBAXIN) 500 MG tablet Take 1 tablet (500 mg total) by mouth every 6 (six) hours as needed for muscle spasms. (Patient not taking: Reported on 08/19/2019) 40 tablet 0  . polyethylene glycol (MIRALAX / GLYCOLAX) packet Take 17 g by mouth daily as needed for moderate constipation.    . traMADol (ULTRAM) 50 MG tablet Take 1-2 tablets (50-100 mg total) by mouth every 6 (six) hours as needed for moderate pain.  (Patient not taking: Reported on 08/19/2019) 40 tablet 0    No results found for this or any previous visit (from the past 48 hour(s)). No results found.  ROS  Blood pressure (!) 149/76, pulse 67, temperature 98.8 F (37.1 C), temperature source Oral, resp. rate (!) 27, height 5' 11.5" (1.816 m), weight 98.6 kg, SpO2 98 %. Physical Exam Physical Examination: General appearance - alert, well appearing, and in no distress Mental status - alert, oriented to person, place, and time Chest - clear to auscultation, no wheezes, rales or rhonchi, symmetric air entry Heart - normal rate, regular rhythm, normal S1, S2, no murmurs, rubs, clicks or gallops Abdomen - soft, nontender, nondistended, no masses or organomegaly Neurological - alert, oriented, normal speech, no focal findings or movement disorder noted Right knee- Moderate effusion; warmth; Range 5-120; no instability    Assessment/Plan Right knee periprosthetic joint infection- Plan resection arthroplasty and antibiotic spacer placement. Discussed procedure, risks and potential complications with patient who elects to proceed  Gaynelle Arabian, MD 08/22/2019, 2:42 PM

## 2019-08-23 ENCOUNTER — Encounter (HOSPITAL_COMMUNITY): Payer: Self-pay | Admitting: Orthopedic Surgery

## 2019-08-23 ENCOUNTER — Inpatient Hospital Stay: Payer: Self-pay

## 2019-08-23 DIAGNOSIS — Z87891 Personal history of nicotine dependence: Secondary | ICD-10-CM

## 2019-08-23 DIAGNOSIS — M00261 Other streptococcal arthritis, right knee: Secondary | ICD-10-CM

## 2019-08-23 DIAGNOSIS — T8453XA Infection and inflammatory reaction due to internal right knee prosthesis, initial encounter: Principal | ICD-10-CM

## 2019-08-23 DIAGNOSIS — E1122 Type 2 diabetes mellitus with diabetic chronic kidney disease: Secondary | ICD-10-CM

## 2019-08-23 DIAGNOSIS — B954 Other streptococcus as the cause of diseases classified elsewhere: Secondary | ICD-10-CM

## 2019-08-23 DIAGNOSIS — N182 Chronic kidney disease, stage 2 (mild): Secondary | ICD-10-CM

## 2019-08-23 LAB — BASIC METABOLIC PANEL
Anion gap: 8 (ref 5–15)
BUN: 27 mg/dL — ABNORMAL HIGH (ref 8–23)
CO2: 20 mmol/L — ABNORMAL LOW (ref 22–32)
Calcium: 8.2 mg/dL — ABNORMAL LOW (ref 8.9–10.3)
Chloride: 109 mmol/L (ref 98–111)
Creatinine, Ser: 1.37 mg/dL — ABNORMAL HIGH (ref 0.61–1.24)
GFR calc Af Amer: 56 mL/min — ABNORMAL LOW (ref >60)
GFR calc non Af Amer: 48 mL/min — ABNORMAL LOW (ref >60)
Glucose, Bld: 187 mg/dL — ABNORMAL HIGH (ref 70–99)
Potassium: 4.5 mmol/L (ref 3.5–5.1)
Sodium: 137 mmol/L (ref 135–145)

## 2019-08-23 LAB — GLUCOSE, CAPILLARY
Glucose-Capillary: 135 mg/dL — ABNORMAL HIGH (ref 70–99)
Glucose-Capillary: 136 mg/dL — ABNORMAL HIGH (ref 70–99)
Glucose-Capillary: 166 mg/dL — ABNORMAL HIGH (ref 70–99)

## 2019-08-23 LAB — CBC
HCT: 33.8 % — ABNORMAL LOW (ref 39.0–52.0)
Hemoglobin: 10.8 g/dL — ABNORMAL LOW (ref 13.0–17.0)
MCH: 27.1 pg (ref 26.0–34.0)
MCHC: 32 g/dL (ref 30.0–36.0)
MCV: 84.7 fL (ref 80.0–100.0)
Platelets: 338 10*3/uL (ref 150–400)
RBC: 3.99 MIL/uL — ABNORMAL LOW (ref 4.22–5.81)
RDW: 13.2 % (ref 11.5–15.5)
WBC: 8.4 10*3/uL (ref 4.0–10.5)
nRBC: 0 % (ref 0.0–0.2)

## 2019-08-23 LAB — C-REACTIVE PROTEIN: CRP: 12.3 mg/dL — ABNORMAL HIGH (ref <1.0)

## 2019-08-23 LAB — SEDIMENTATION RATE: Sed Rate: 52 mm/hr — ABNORMAL HIGH (ref 0–16)

## 2019-08-23 MED ORDER — CHLORHEXIDINE GLUCONATE CLOTH 2 % EX PADS
6.0000 | MEDICATED_PAD | Freq: Every day | CUTANEOUS | Status: DC
  Administered 2019-08-24: 6 via TOPICAL

## 2019-08-23 MED ORDER — CEFAZOLIN SODIUM-DEXTROSE 2-4 GM/100ML-% IV SOLN
2.0000 g | Freq: Three times a day (TID) | INTRAVENOUS | Status: DC
  Administered 2019-08-23 – 2019-08-24 (×4): 2 g via INTRAVENOUS
  Filled 2019-08-23 (×5): qty 100

## 2019-08-23 MED ORDER — VANCOMYCIN HCL IN DEXTROSE 1-5 GM/200ML-% IV SOLN: 1000.0000 mg | INTRAVENOUS | Status: DC

## 2019-08-23 MED ORDER — SODIUM CHLORIDE 0.9% FLUSH
10.0000 mL | Freq: Two times a day (BID) | INTRAVENOUS | Status: DC
  Administered 2019-08-23: 22:00:00 10 mL

## 2019-08-23 MED ORDER — SODIUM CHLORIDE 0.9% FLUSH
10.0000 mL | INTRAVENOUS | Status: DC | PRN
  Administered 2019-08-23 – 2019-08-24 (×2): 10 mL
  Filled 2019-08-23 (×2): qty 40

## 2019-08-23 NOTE — Progress Notes (Signed)
Peripherally Inserted Central Catheter/Midline Placement  The IV Nurse has discussed with the patient and/or persons authorized to consent for the patient, the purpose of this procedure and the potential benefits and risks involved with this procedure.  The benefits include less needle sticks, lab draws from the catheter, and the patient may be discharged home with the catheter. Risks include, but not limited to, infection, bleeding, blood clot (thrombus formation), and puncture of an artery; nerve damage and irregular heartbeat and possibility to perform a PICC exchange if needed/ordered by physician.  Alternatives to this procedure were also discussed.  Bard Power PICC patient education guide, fact sheet on infection prevention and patient information card has been provided to patient /or left at bedside.    PICC/Midline Placement Documentation  PICC Single Lumen [id number] PICC Right Basilic 44 cm 1 cm (Active)  Indication for Insertion or Continuance of Line Home intravenous therapies (PICC only) 08/23/19 1716  Exposed Catheter (cm) 0 cm 08/23/19 1716  Site Assessment Clean;Dry;Intact 08/23/19 1716  Line Status Blood return noted;Flushed;Saline locked 08/23/19 1716  Dressing Type Transparent;Securing device 08/23/19 1716  Dressing Status Clean;Dry;Intact;Antimicrobial disc in place 08/23/19 1716  Dressing Change Due 08/30/19 08/23/19 1716       Drew Hudson 08/23/2019, 5:25 PM

## 2019-08-23 NOTE — Progress Notes (Signed)
Physical Therapy Treatment Patient Details Name: Drew Hudson MRN: [id number] DOB: [birth year] Today's Date: 08/23/2019    History of Present Illness Pt is an 83 year old male s/p Right knee resection arthroplasty; antibiotic spacer with PMHx of R TKA 10/18/18, OA, cataracts, glaucoma, HTN, peripheral neuroapthy, OSA    PT Comments    Pt ambulated in hallway again this afternoon and performed LE exercises.    Follow Up Recommendations  Follow surgeon's recommendation for DC plan and follow-up therapies     Equipment Recommendations  None recommended by PT    Recommendations for Other Services       Precautions / Restrictions Precautions Precautions: Knee Precaution Comments: full ROM per ortho notes Required Braces or Orthoses: Knee Immobilizer - Right Restrictions Other Position/Activity Restrictions: WBAT    Mobility  Bed Mobility Overal bed mobility: Needs Assistance Bed Mobility: Supine to Sit;Sit to Supine     Supine to sit: Min guard Sit to supine: Min guard   General bed mobility comments: pt self assisted R LE with UEs  Transfers Overall transfer level: Needs assistance Equipment used: Rolling walker (2 wheeled) Transfers: Sit to/from Stand Sit to Stand: Min assist         General transfer comment: slight assist to rise, cues for UE and LE positioning  Ambulation/Gait Ambulation/Gait assistance: Min guard Gait Distance (Feet): 120 Feet Assistive device: Rolling walker (2 wheeled) Gait Pattern/deviations: Step-to pattern;Decreased stance time - right;Antalgic Gait velocity: decr   General Gait Details: verbal cues for sequence, RW positioning, posture   Stairs             Wheelchair Mobility    Modified Rankin (Stroke Patients Only)       Balance                                            Cognition Arousal/Alertness: Awake/alert Behavior During Therapy: WFL for tasks assessed/performed Overall Cognitive  Status: Within Functional Limits for tasks assessed                                        Exercises Total Joint Exercises Ankle Circles/Pumps: AROM;Both;10 reps Quad Sets: AROM;Right;10 reps Heel Slides: AAROM;Right;10 reps Hip ABduction/ADduction: Right;10 reps;AAROM Straight Leg Raises: AAROM;Right;10 reps    General Comments        Pertinent Vitals/Pain Pain Assessment: 0-10 Pain Score: 4  Pain Location: right knee Pain Descriptors / Indicators: Sore;Aching Pain Intervention(s): Monitored during session;Repositioned;Ice applied    Home Living                      Prior Function            PT Goals (current goals can now be found in the care plan section) Progress towards PT goals: Progressing toward goals    Frequency    7X/week      PT Plan Current plan remains appropriate    Co-evaluation              AM-PAC PT "6 Clicks" Mobility   Outcome Measure  Help needed turning from your back to your side while in a flat bed without using bedrails?: A Little Help needed moving from lying on your back to sitting on the side of a flat bed without  using bedrails?: A Little Help needed moving to and from a bed to a chair (including a wheelchair)?: A Little Help needed standing up from a chair using your arms (e.g., wheelchair or bedside chair)?: A Little Help needed to walk in hospital room?: A Little Help needed climbing 3-5 steps with a railing? : A Lot 6 Click Score: 17    End of Session Equipment Utilized During Treatment: Gait belt Activity Tolerance: Patient tolerated treatment well Patient left: in bed;with call bell/phone within reach;with bed alarm set   PT Visit Diagnosis: Other abnormalities of gait and mobility (R26.89)     Time: [id number] PT Time Calculation (min) (ACUTE ONLY): 20 min  Charges:  $Therapeutic Exercise: 8-22 mins                     Drew Hudson, PT, DPT Acute Rehabilitation Services Office:  [phone] Pager: [phone]  Trena Platt 08/23/2019, 4:35 PM

## 2019-08-23 NOTE — Plan of Care (Signed)

## 2019-08-23 NOTE — Progress Notes (Addendum)
Subjective: 1 Day Post-Op Procedure(s) (LRB): Right knee resection arthroplasty; antibiotic spacer (Right) Patient reports pain as mild.   Patient seen in rounds by Dr. Wynelle Link. Patient is well, and has had no acute complaints or problems other than discomfort in the right knee. No acute events overnight. Denies CP, SHOB.  We will start therapy today.   Objective: Vital signs in last 24 hours: Temp:  [97.8 F (36.6 C)-99 F (37.2 C)] 97.8 F (36.6 C) (10/20 0524) Pulse Rate:  [62-74] 63 (10/20 0524) Resp:  [12-27] 16 (10/20 0524) BP: (136-155)/(68-89) 148/89 (10/20 0524) SpO2:  [92 %-100 %] 100 % (10/20 0524) Weight:  [98.6 kg] 98.6 kg (10/19 1428)  Intake/Output from previous day:  Intake/Output Summary (Last 24 hours) at 08/23/2019 0728 Last data filed at 08/23/2019 0644 Gross per 24 hour  Intake 2846.77 ml  Output 1320 ml  Net 1526.77 ml     Intake/Output this shift: No intake/output data recorded.  Labs: Recent Labs    08/23/19 0318  HGB 10.8*   Recent Labs    08/23/19 0318  WBC 8.4  RBC 3.99*  HCT 33.8*  PLT 338   Recent Labs    08/23/19 0318  NA 137  K 4.5  CL 109  CO2 20*  BUN 27*  CREATININE 1.37*  GLUCOSE 187*  CALCIUM 8.2*   No results for input(s): LABPT, INR in the last 72 hours.  Exam: General - Patient is Alert and Oriented Extremity - Neurologically intact Sensation intact distally Intact pulses distally Dorsiflexion/Plantar flexion intact Dressing - dressing C/D/I Motor Function - intact, moving foot and toes well on exam.   Past Medical History:  Diagnosis Date  . Arthritis   . Cataract   . Chronic kidney disease    Stage 3 per pt  . Diabetes mellitus without complication (HCC)    diet controlled  . Dizziness   . Dry eyes   . Edema    Bilateral legs  . Fecal incontinence   . Generalized weakness   . GERD (gastroesophageal reflux disease)   . Glaucoma   . Headache   . History of chest pain    Saw cardiologist,  no blockages noted, possible gas pains  . Hyperlipidemia   . Hypertension   . Idiopathic peripheral neuropathy   . Insomnia   . OSA on CPAP   . Prostate cancer ([place])   . Urinary urgency   . Vitamin D deficiency   . Wears glasses   . Wears hearing aid in both ears   . Wears partial dentures     Assessment/Plan: 1 Day Post-Op Procedure(s) (LRB): Right knee resection arthroplasty; antibiotic spacer (Right) Principal Problem:   Septic arthritis of knee, right (HCC)  Estimated body mass index is 29.9 kg/m as calculated from the following:   Height as of this encounter: 5' 11.5" (1.816 m).   Weight as of this encounter: 98.6 kg. Advance diet   DVT Prophylaxis - Aspirin Weight bearing as tolerated. D/C O2 and pulse ox and try on room air.  Plan is to go Home after hospital stay. Patient to begin working with PT today, he is WBAT to the RLE with full ROM allowed.  Patient is currently on Vancomycin. Cultures from in-office aspiration on 08/16/19 copied to the chart as a progress note. Will obtain Infectious Disease consult, and appreciate recommendations for antibiotic regimen. Order for PICC line placed this morning. Continue pain management.  Griffith Citron, PA-C Orthopedic Surgery [phone] 08/23/2019, 7:28  AM

## 2019-08-23 NOTE — Progress Notes (Signed)
Pharmacy Antibiotic Note  Drew Hudson is a 83 y.o. male admitted on 08/22/2019 with prosthetic joint infection.  Pharmacy has been consulted for vancomycin dosing.  Pt underwent right knee resection arthroplasty with antibiotic spacer on 10/19, received vancomycin 1000 mg IV dose post operatively, will repeat 1000 mg dose now for a total LD of 2000 mg.   Today, 08/23/19  WBC 8.4  SCr 1.37, Cl ~ 51  Afebrile  Plan:  Goal vancomycin AUC 400-550  Vancomycin 1gm q24, for AUC 472, SCr 1.37, Vd 0.5 (BMI 29.4)  Follow renal function  Check vancomycin levels once at steady state if indicated  Height: 5' 11.5" (181.6 cm) Weight: 217 lb 6.4 oz (98.6 kg) IBW/kg (Calculated) : 76.45  Temp (24hrs), Avg:98.4 F (36.9 C), Min:97.8 F (36.6 C), Max:99 F (37.2 C)  Recent Labs  Lab 08/19/19 0937 08/23/19 0318  WBC 6.7 8.4  CREATININE 1.64* 1.37*    Estimated Creatinine Clearance: 51 mL/min (A) (by C-G formula based on SCr of 1.37 mg/dL (H)).    Allergies  Allergen Reactions  . Lovastatin Itching  . Penicillins Rash    Has patient had a PCN reaction causing immediate rash, facial/tongue/throat swelling, SOB or lightheadedness with hypotension: No Has patient had a PCN reaction causing severe rash involving mucus membranes or skin necrosis: No Has patient had a PCN reaction that required hospitalization: No Has patient had a PCN reaction occurring within the last 10 years: Yes If all of the above answers are "NO", then may proceed with Cephalosporin use.     Antimicrobials this admission: 10/19 Vancomycin >>   Dose adjustments this admission: 10/20 Vanc 750mg  q24 > AUC 427, with SCr 1.64 Increase to 1gm q24 for AUC 472, SCr decr to 1.37, Vd 0.5  Microbiology results: None obtained  Thank you for allowing pharmacy to be a part of this patient's care.  Minda Ditto, PharmD 08/23/2019 7:59 AM

## 2019-08-23 NOTE — Op Note (Signed)
Drew Hudson, Drew Hudson MEDICAL RECORD [id number] ACCOUNT [id number] DATE OF BIRTH:Nov 15, 1935 FACILITY: [facility] LOCATION: [id number] PHYSICIAN:Saryiah Bencosme Zella Ball, MD  OPERATIVE REPORT  DATE OF PROCEDURE:  08/22/2019  PREOPERATIVE DIAGNOSIS:  Periprosthetic joint infection, right total knee.  POSTOPERATIVE DIAGNOSIS:  Periprosthetic joint infection, right total knee.  PROCEDURE:  Right knee resection arthroplasty and antibiotic spacer.  SURGEON:  Gaynelle Arabian, MD  ASSISTANT:  Griffith Citron, PA-C  ANESTHESIA:  General.  ESTIMATED BLOOD LOSS:  100.  DRAIN:  Hemovac x1.  TOURNIQUET TIME:  52 minutes at 300 mmHg.  COMPLICATIONS:  None.  CONDITION:  Stable to recovery.  BRIEF CLINICAL NOTE:  The patient is an 83 year old male who had a right total knee arthroplasty done in December 2019.  He was doing great up until about 4 weeks ago when he started to develop pain and swelling in the knee.  He presented to the office  last week when he started to have warmth about the knee with increased pain and fever.  He was afebrile on presentation to the office, but did have a warm, swollen knee.  Aspiration revealed purulent fluid, which grew Staphylococcus lugdunensis.  He  presents now for resection arthroplasty with antibiotic spacer placement.  PROCEDURE IN DETAIL:  After successful administration of general anesthetic, a tourniquet was placed on the right thigh and right lower extremity, prepped and draped in the usual sterile fashion.  Extremity was wrapped in Esmarch and tourniquet inflated  to 300 mmHg.  A midline incision was made with a 10 blade through subcutaneous tissue to the extensor mechanism.  This was incised in line with the skin incision.  The joint was entered and there was purulent fluid with a tremendous amount of  hypertrophic synovial tissue.  Thorough synovectomy was performed under the medial and lateral portion of the extensor mechanism.  Infrapatellar fat pad was  removed.  Soft tissue over the proximal medial tibia subperiosteally elevated to the joint line  with a knife and into the semimembranosus bursa with a Cobb elevator.  Soft tissue laterally was elevated with attention being paid to avoiding the patellar tendon on the tibial tubercle.  Patella was subluxed laterally.  The knee was flexed to 90  degrees.  The tibial polyethylene was removed.  Circumferential retraction was placed around the proximal tibia, and then the oscillating saw was used to disrupt the interface between the tibial component and bone.  Tibial component was removed.  Oscillating saw was then used to make a level resection  about 1 mm to 2 mm below the cement to healthy-appearing bone.  The cement was then removed from the tibial canal.  The osteotome was used to disrupt the interface between the femoral component and bone.  Femoral component was removed with minimal to no bone loss.  Femoral canal was thoroughly irrigated, and tissue was debrided from the canal.  We then used the guide  for the size 5 Sigma femur and made our cuts on the femur distally, chamfer cuts anterior/posterior, to freshen up and get back to normal bone.  An intercondylar cut was also made.  A size 5 femoral component and a 20 mm size 5 tibial insert were  utilized to create the articulating spacer.  Two batches of gentamicin-impregnated cement were then mixed.  While the cement was being mixed, 3 L of saline with pulsatile lavage were used to thoroughly irrigate the joint.  Once the cement was ready, then  the size 5 Sigma RP femoral component was cemented in  place, and a 20 mm size 5 tibial insert was also cemented into place.  The knee was held in full extension with tension on the collateral ligaments to create a stable extension space.  We then flexed  the knee 90 degrees with stable flexion space.  I then removed the remainder of the soft tissue around the articular surface of the patella and removed the  polyethylene patella.  We thoroughly irrigated with another liter of saline with pulsatile  lavage.  Once the cement was hard, we put the knee through a range of motion at 0-100 with excellent stability throughout full range of motion.  A Hemovac drain was placed in the joint and then the arthrotomy closed over the drain with a 0 Stratafix  suture.  Tourniquet was released.  Total time 52 minutes.  Minor bleeding stopped with subcu and cautery.  A second limb of the drain was placed in the subcu.  Subcu was closed with interrupted 2-0 Vicryl and skin closed with staples.  Incisions were  cleaned and dried and a bulky sterile dressing applied.  He was awakened and transported to recovery in stable condition.    Please note that a surgical assistant was a medical necessity for this procedure.  Assistance was necessary for retraction of vital ligaments and neurovascular structures and for proper positioning of the limb for safe removal of the old implant and safe  and accurate placement of the new spacer.  LN/NUANCE  D:08/22/2019 T:08/23/2019 JOB:[id number]

## 2019-08-23 NOTE — Progress Notes (Addendum)
Pharmacy Antibiotic Note  Drew Hudson is a 83 y.o. male admitted on 08/22/2019 with prosthetic joint infection for Right knee.  Pharmacy has been consulted for Cefazolin dosing. He is s/p antibiotic spacer 10/19.    Office culture reveals Methicillin-susc S. lugdunensis  History of rash to PCN but agree with Dr Johnnye Sima that cefazolin poses little to no risk.  Cefazolin has completely different side chain vs all other b-lactams so chance of cross reactivity is very low.   Plan:  Cefazolin 2gm IV q8h - Pharmacy to follow peripherally  See OPAT orders  Height: 5' 11.5" (181.6 cm) Weight: 217 lb 6.4 oz (98.6 kg) IBW/kg (Calculated) : 76.45  Temp (24hrs), Avg:98.4 F (36.9 C), Min:97.8 F (36.6 C), Max:99 F (37.2 C)  Recent Labs  Lab 08/19/19 0937 08/23/19 0318  WBC 6.7 8.4  CREATININE 1.64* 1.37*    Estimated Creatinine Clearance: 51 mL/min (A) (by C-G formula based on SCr of 1.37 mg/dL (H)).    Allergies  Allergen Reactions  . Lovastatin Itching  . Penicillins Rash    Has patient had a PCN reaction causing immediate rash, facial/tongue/throat swelling, SOB or lightheadedness with hypotension: No Has patient had a PCN reaction causing severe rash involving mucus membranes or skin necrosis: No Has patient had a PCN reaction that required hospitalization: No Has patient had a PCN reaction occurring within the last 10 years: Yes If all of the above answers are "NO", then may proceed with Cephalosporin use.     Thank you for allowing pharmacy to be a part of this patient's care.  Doreene Eland, PharmD, BCPS.   Work Cell: [phone] 08/23/2019 11:17 AM

## 2019-08-23 NOTE — Consult Note (Signed)
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 Change vanco to ancef 2. pic to be placed today 3. Monitor Glc 4. Check ESR and CRP 5. Watch Cr after vanco   Comment- Unusual for S lugdensis to be sensitve to cephalosporin.  His PEN allergy is non-specific and testing him here in house is reasonable.  < 10% risk of cross reaction with cephalosporin.   Thank you so much for this interesting consult,  Principal Problem:   Septic arthritis of knee, right ([place])   . acetaminophen  1,000 mg Oral Q6H  . amLODipine  10 mg Oral Daily  . aspirin EC  325 mg Oral BID  . atorvastatin  20 mg Oral q1800  . docusate sodium  100 mg Oral BID  . dorzolamide-timolol  1 drop Both Eyes BID  . latanoprost  1 drop Both Eyes QHS  . pantoprazole  40 mg Oral Daily    HPI: Olegario Emberson is a 83 y.o. 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 He did well with this until ~ 3 weeks ago when he developed pain, nausea and fever. He then developed worsening ROM, and finally was seen by his orthopedist. He had joint effusion at that time and had fluid removed.  His Cx at that time grew S lugdenensis (pan-sens).  He underwent resection arthroplasty 10-19.   HgB A1C 6.4%  Review of Systems: Review of Systems  Constitutional: Positive for chills and malaise/fatigue. Negative for fever.  Respiratory: Negative for cough and shortness of breath.   Cardiovascular: Positive for leg swelling.  Gastrointestinal: Negative for abdominal pain and diarrhea.  he denies injury to his leg.  Please see HPI. All other systems reviewed and negative.   Past Medical History:  Diagnosis Date  . Arthritis   . Cataract   .  Chronic kidney disease    Stage 3 per pt  . Diabetes mellitus without complication (HCC)    diet controlled  . Dizziness   . Dry eyes   . Edema    Bilateral legs  . Fecal incontinence   . Generalized weakness   . GERD (gastroesophageal reflux disease)   . Glaucoma   . Headache   . History of chest pain    Saw cardiologist, no blockages noted, possible gas pains  . Hyperlipidemia   . Hypertension   . Idiopathic peripheral neuropathy   . Insomnia   . OSA on CPAP   . Prostate cancer ([place])   . Urinary urgency   . Vitamin D deficiency   . Wears glasses   . Wears hearing aid in both ears   . Wears partial dentures     Social History   Tobacco Use  . Smoking status: Former Research scientist (life sciences)  . Smokeless tobacco: Never Used  Substance Use Topics  . Alcohol use: Never    Frequency: Never  . Drug use: Never    History reviewed. No pertinent family history.   Medications:  Scheduled: . acetaminophen  1,000 mg Oral Q6H  . amLODipine  10 mg Oral Daily  . aspirin EC  325 mg Oral BID  .  atorvastatin  20 mg Oral q1800  . docusate sodium  100 mg Oral BID  . dorzolamide-timolol  1 drop Both Eyes BID  . latanoprost  1 drop Both Eyes QHS  . pantoprazole  40 mg Oral Daily    Abtx:  Anti-infectives (From admission, onward)   Start     Dose/Rate Route Frequency Ordered Stop   08/23/19 2100  vancomycin (VANCOCIN) IVPB 750 mg/150 ml premix  Status:  Discontinued     750 mg 150 mL/hr over 60 Minutes Intravenous Every 24 hours 08/22/19 1957 08/23/19 0813   08/23/19 2100  vancomycin (VANCOCIN) IVPB 1000 mg/200 mL premix     1,000 mg 200 mL/hr over 60 Minutes Intravenous Every 24 hours 08/23/19 0813     08/22/19 2030  vancomycin (VANCOCIN) IVPB 1000 mg/200 mL premix     1,000 mg 200 mL/hr over 60 Minutes Intravenous  Once 08/22/19 1953 08/22/19 2123   08/22/19 1400  vancomycin (VANCOCIN) IVPB 1000 mg/200 mL premix     1,000 mg 200 mL/hr over 60 Minutes Intravenous On call to O.R. 08/22/19  1354 08/22/19 1630      States he is allergic to PEN- can't remember when or what the reaction was. "it made me feel different"  OBJECTIVE: Blood pressure (!) 144/71, pulse 67, temperature 98.2 F (36.8 C), resp. rate 17, height 5' 11.5" (1.816 m), weight 98.6 kg, SpO2 99 %.  Physical Exam Constitutional:      Appearance: Normal appearance.  HENT:     Mouth/Throat:     Mouth: Mucous membranes are moist.     Pharynx: No oropharyngeal exudate.  Eyes:     Extraocular Movements: Extraocular movements intact.     Pupils: Pupils are equal, round, and reactive to light.  Neck:     Musculoskeletal: Normal range of motion and neck supple.  Cardiovascular:     Rate and Rhythm: Normal rate and regular rhythm.  Pulmonary:     Effort: Pulmonary effort is normal.     Breath sounds: Normal breath sounds.  Abdominal:     General: Abdomen is flat. Bowel sounds are normal. There is no distension.     Palpations: Abdomen is soft.     Tenderness: There is no abdominal tenderness.  Musculoskeletal: Normal range of motion.        General: Swelling present.       Legs:  Neurological:     General: No focal deficit present.     Mental Status: He is alert.  Psychiatric:        Mood and Affect: Mood normal.     Lab Results Results for orders placed or performed during the hospital encounter of 08/22/19 (from the past 48 hour(s))  Glucose, capillary     Status: None   Collection Time: 08/22/19  2:14 PM  Result Value Ref Range   Glucose-Capillary 95 70 - 99 mg/dL   Comment 1 Notify RN    Comment 2 Document in Chart   Glucose, capillary     Status: None   Collection Time: 08/22/19  6:34 PM  Result Value Ref Range   Glucose-Capillary 98 70 - 99 mg/dL  Glucose, capillary     Status: Abnormal   Collection Time: 08/22/19  9:56 PM  Result Value Ref Range   Glucose-Capillary 190 (H) 70 - 99 mg/dL  CBC     Status: Abnormal   Collection Time: 08/23/19  3:18 AM  Result Value Ref Range   WBC  8.4 4.0 -  10.5 K/uL   RBC 3.99 (L) 4.22 - 5.81 MIL/uL   Hemoglobin 10.8 (L) 13.0 - 17.0 g/dL   HCT 33.8 (L) 39.0 - 52.0 %   MCV 84.7 80.0 - 100.0 fL   MCH 27.1 26.0 - 34.0 pg   MCHC 32.0 30.0 - 36.0 g/dL   RDW 13.2 11.5 - 15.5 %   Platelets 338 150 - 400 K/uL   nRBC 0.0 0.0 - 0.2 %    Comment: Performed at [facility], [place] [address], [place], [place] [address]  Basic metabolic panel     Status: Abnormal   Collection Time: 08/23/19  3:18 AM  Result Value Ref Range   Sodium 137 135 - 145 mmol/L   Potassium 4.5 3.5 - 5.1 mmol/L   Chloride 109 98 - 111 mmol/L   CO2 20 (L) 22 - 32 mmol/L   Glucose, Bld 187 (H) 70 - 99 mg/dL   BUN 27 (H) 8 - 23 mg/dL   Creatinine, Ser 1.37 (H) 0.61 - 1.24 mg/dL   Calcium 8.2 (L) 8.9 - 10.3 mg/dL   GFR calc non Af Amer 48 (L) >60 mL/min   GFR calc Af Amer 56 (L) >60 mL/min   Anion gap 8 5 - 15    Comment: Performed at [facility], [place] [address], [place], [place] [address]  Glucose, capillary     Status: Abnormal   Collection Time: 08/23/19  7:32 AM  Result Value Ref Range   Glucose-Capillary 135 (H) 70 - 99 mg/dL   No results found for: SDES, SPECREQUEST, CULT, REPTSTATUS [place] Ekg Site Rite  Result Date: 08/23/2019 If Site Rite image not attached, placement could not be confirmed due to current cardiac rhythm.  Recent Results (from the past 240 hour(s))  Surgical pcr screen     Status: None   Collection Time: 08/19/19  9:37 AM   Specimen: Nasal Mucosa; Nasal Swab  Result Value Ref Range Status   MRSA, PCR NEGATIVE NEGATIVE Final   Staphylococcus aureus NEGATIVE NEGATIVE Final    Comment: (NOTE) The Xpert SA Assay (FDA approved for NASAL specimens in patients 66 years of age and older), is one component of a comprehensive surveillance program. It is not intended to diagnose infection nor to guide or monitor treatment. Performed at [facility], [place] [address], [place], [place]  [address]   Novel Coronavirus, NAA [facility] order, Send-out to Ref Lab; TAT 18-24 hrs     Status: None   Collection Time: 08/19/19 10:33 AM   Specimen: Nasopharyngeal Swab; Respiratory  Result Value Ref Range Status   SARS-CoV-2, NAA NOT DETECTED NOT DETECTED Final    Comment: (NOTE) This nucleic acid amplification test was developed and its performance characteristics determined by [employer]. Nucleic acid amplification tests include PCR and TMA. This test has not been FDA cleared or approved. This test has been authorized by FDA under an Emergency Use Authorization (EUA). This test is only authorized for the duration of time the declaration that circumstances exist justifying the authorization of the emergency use of in vitro diagnostic tests for detection of SARS-CoV-2 virus and/or diagnosis of COVID-19 infection under section 564(b)(1) of the Act, 21 U.S.C. 798XQJ-1(H) (1), unless the authorization is terminated or revoked sooner. When diagnostic testing is negative, the possibility of a false negative result should be considered in the context of a patient's recent exposures and the presence of clinical signs and symptoms consistent with COVID-19. An individual without symptoms  of COVID- 19 and who is not shedding SARS-CoV-2 vi rus would expect to have a negative (not detected) result in this assay. Performed At: [facility] [address] [place], [place] [id number] Rush Farmer MD OE:[id number]    [place]  Final    Comment: Performed at [facility], [place] [address], [place], [place] [address]    Microbiology: Recent Results (from the past 240 hour(s))  Surgical pcr screen     Status: None   Collection Time: 08/19/19  9:37 AM   Specimen: Nasal Mucosa; Nasal Swab  Result Value Ref Range Status   MRSA, PCR NEGATIVE NEGATIVE Final   Staphylococcus aureus NEGATIVE NEGATIVE Final    Comment: (NOTE) The Xpert SA Assay (FDA  approved for NASAL specimens in patients 76 years of age and older), is one component of a comprehensive surveillance program. It is not intended to diagnose infection nor to guide or monitor treatment. Performed at [facility], [place] [address], [place], [place] [address]   Novel Coronavirus, NAA [facility] order, Send-out to Ref Lab; TAT 18-24 hrs     Status: None   Collection Time: 08/19/19 10:33 AM   Specimen: Nasopharyngeal Swab; Respiratory  Result Value Ref Range Status   SARS-CoV-2, NAA NOT DETECTED NOT DETECTED Final    Comment: (NOTE) This nucleic acid amplification test was developed and its performance characteristics determined by [employer]. Nucleic acid amplification tests include PCR and TMA. This test has not been FDA cleared or approved. This test has been authorized by FDA under an Emergency Use Authorization (EUA). This test is only authorized for the duration of time the declaration that circumstances exist justifying the authorization of the emergency use of in vitro diagnostic tests for detection of SARS-CoV-2 virus and/or diagnosis of COVID-19 infection under section 564(b)(1) of the Act, 21 U.S.C. 789FYB-0(F) (1), unless the authorization is terminated or revoked sooner. When diagnostic testing is negative, the possibility of a false negative result should be considered in the context of a patient's recent exposures and the presence of clinical signs and symptoms consistent with COVID-19. An individual without symptoms of COVID- 19 and who is not shedding SARS-CoV-2 vi rus would expect to have a negative (not detected) result in this assay. Performed At: [facility] [address] [place], [place] [id number] Rush Farmer MD DP:[id number]    [place]  Final    Comment: Performed at [facility], [place] [address], [place], [place] [address]    Radiographs and labs were personally reviewed by  me.   Bobby Rumpf, MD [facility] [place] [facility] [phone] 08/23/2019, 10:15 AM

## 2019-08-23 NOTE — Progress Notes (Signed)
PHARMACY CONSULT NOTE FOR:  OUTPATIENT  PARENTERAL ANTIBIOTIC THERAPY (OPAT)  Indication: R knee PJI Regimen: cefazolin 2gm IV q8h End date: 10/03/2019  IV antibiotic discharge orders are pended. To discharging provider:  please sign these orders via discharge navigator,  Select New Orders & click on the button choice - Manage This Unsigned Work.     Thank you for allowing pharmacy to be a part of this patient's care.  Doreene Eland, PharmD, BCPS.   Work Cell: [phone] 08/23/2019 11:46 AM

## 2019-08-23 NOTE — Evaluation (Signed)
Physical Therapy Evaluation Patient Details Name: Drew Hudson MRN: [id number] DOB: [birth year] Today's Date: 08/23/2019   History of Present Illness  Pt is an 83 year old male s/p Right knee resection arthroplasty; antibiotic spacer with PMHx of R TKA 10/18/18, OA, cataracts, glaucoma, HTN, peripheral neuroapthy, OSA  Clinical Impression  Patient is s/p above surgery resulting in functional limitations due to the deficits listed below (see PT Problem List).  Patient will benefit from skilled PT to increase their independence and safety with mobility to allow discharge to the venue listed below.   Pt assisted with ambulating in hallway POD #1 and plans to d/c home with assist from daughter.        Follow Up Recommendations Follow surgeon's recommendation for DC plan and follow-up therapies    Equipment Recommendations  None recommended by PT    Recommendations for Other Services       Precautions / Restrictions Precautions Precautions: Knee Precaution Comments: full ROM per ortho notes Required Braces or Orthoses: Knee Immobilizer - Right Restrictions Weight Bearing Restrictions: No Other Position/Activity Restrictions: WBAT      Mobility  Bed Mobility Overal bed mobility: Needs Assistance Bed Mobility: Supine to Sit;Sit to Supine     Supine to sit: Min guard Sit to supine: Min guard   General bed mobility comments: pt self assisted R LE with UEs  Transfers Overall transfer level: Needs assistance Equipment used: Rolling walker (2 wheeled) Transfers: Sit to/from Stand Sit to Stand: Min assist         General transfer comment: slight assist to rise, cues for UE and LE positioning  Ambulation/Gait Ambulation/Gait assistance: Min guard Gait Distance (Feet): 100 Feet Assistive device: Rolling walker (2 wheeled) Gait Pattern/deviations: Step-to pattern;Decreased stance time - right;Antalgic Gait velocity: decr   General Gait Details: verbal cues for sequence,  RW positioning, posture  Stairs            Wheelchair Mobility    Modified Rankin (Stroke Patients Only)       Balance                                             Pertinent Vitals/Pain Pain Assessment: 0-10 Pain Score: 3  Pain Location: right knee Pain Descriptors / Indicators: Sore;Aching Pain Intervention(s): Monitored during session;Repositioned    Home Living Family/patient expects to be discharged to:: Private residence   Available Help at Discharge: Family;Available 24 hours/day Type of Home: House Home Access: Stairs to enter Entrance Stairs-Rails: Right;Left;Can reach both Entrance Stairs-Number of Steps: 3-4 steps Home Layout: Two level;Able to live on main level with bedroom/bathroom Home Equipment: Kasandra Knudsen - single point;Walker - 2 wheels;Bedside commode Additional Comments: daughter to stay with pt upon d/c home    Prior Function Level of Independence: Independent with assistive device(s)               Hand Dominance        Extremity/Trunk Assessment        Lower Extremity Assessment Lower Extremity Assessment: RLE deficits/detail RLE Deficits / Details: unable to perform SLR, maintained KI       Communication   Communication: No difficulties  Cognition Arousal/Alertness: Awake/alert Behavior During Therapy: WFL for tasks assessed/performed Overall Cognitive Status: Within Functional Limits for tasks assessed  General Comments      Exercises     Assessment/Plan    PT Assessment Patient needs continued PT services  PT Problem List Decreased strength;Decreased mobility;Decreased knowledge of use of DME;Pain;Decreased knowledge of precautions;Decreased range of motion       PT Treatment Interventions Stair training;Gait training;DME instruction;Therapeutic exercise;Functional mobility training;Therapeutic activities;Patient/family education    PT Goals  (Current goals can be found in the Care Plan section)  Acute Rehab PT Goals PT Goal Formulation: With patient Time For Goal Achievement: 08/30/19 Potential to Achieve Goals: Good    Frequency 7X/week   Barriers to discharge        Co-evaluation               AM-PAC PT "6 Clicks" Mobility  Outcome Measure Help needed turning from your back to your side while in a flat bed without using bedrails?: A Little Help needed moving from lying on your back to sitting on the side of a flat bed without using bedrails?: A Little Help needed moving to and from a bed to a chair (including a wheelchair)?: A Little Help needed standing up from a chair using your arms (e.g., wheelchair or bedside chair)?: A Little Help needed to walk in hospital room?: A Little Help needed climbing 3-5 steps with a railing? : A Lot 6 Click Score: 17    End of Session Equipment Utilized During Treatment: Gait belt Activity Tolerance: Patient tolerated treatment well Patient left: in bed;with call bell/phone within reach;with bed alarm set   PT Visit Diagnosis: Other abnormalities of gait and mobility (R26.89)    Time: [id number] PT Time Calculation (min) (ACUTE ONLY): 14 min   Charges:   PT Evaluation $PT Eval Low Complexity: [place], PT, DPT Acute Rehabilitation Services Office: [phone] Pager: [phone]  Trena Platt 08/23/2019, 12:11 PM

## 2019-08-24 DIAGNOSIS — Z888 Allergy status to other drugs, medicaments and biological substances status: Secondary | ICD-10-CM

## 2019-08-24 DIAGNOSIS — Z880 Allergy status to penicillin: Secondary | ICD-10-CM

## 2019-08-24 DIAGNOSIS — Z95828 Presence of other vascular implants and grafts: Secondary | ICD-10-CM

## 2019-08-24 LAB — BASIC METABOLIC PANEL
Anion gap: 7 (ref 5–15)
BUN: 22 mg/dL (ref 8–23)
CO2: 23 mmol/L (ref 22–32)
Calcium: 8.2 mg/dL — ABNORMAL LOW (ref 8.9–10.3)
Chloride: 108 mmol/L (ref 98–111)
Creatinine, Ser: 1.31 mg/dL — ABNORMAL HIGH (ref 0.61–1.24)
GFR calc Af Amer: 59 mL/min — ABNORMAL LOW (ref >60)
GFR calc non Af Amer: 51 mL/min — ABNORMAL LOW (ref >60)
Glucose, Bld: 121 mg/dL — ABNORMAL HIGH (ref 70–99)
Potassium: 4.1 mmol/L (ref 3.5–5.1)
Sodium: 138 mmol/L (ref 135–145)

## 2019-08-24 LAB — CBC
HCT: 31.7 % — ABNORMAL LOW (ref 39.0–52.0)
Hemoglobin: 10.2 g/dL — ABNORMAL LOW (ref 13.0–17.0)
MCH: 27 pg (ref 26.0–34.0)
MCHC: 32.2 g/dL (ref 30.0–36.0)
MCV: 83.9 fL (ref 80.0–100.0)
Platelets: 351 10*3/uL (ref 150–400)
RBC: 3.78 MIL/uL — ABNORMAL LOW (ref 4.22–5.81)
RDW: 13 % (ref 11.5–15.5)
WBC: 9.4 10*3/uL (ref 4.0–10.5)
nRBC: 0 % (ref 0.0–0.2)

## 2019-08-24 MED ORDER — METHOCARBAMOL 500 MG PO TABS: 500.0000 mg | ORAL_TABLET | Freq: Four times a day (QID) | ORAL | 0 refills | Status: DC | PRN

## 2019-08-24 MED ORDER — CEFAZOLIN IV (FOR PTA / DISCHARGE USE ONLY): 2.0000 g | Freq: Three times a day (TID) | INTRAVENOUS | 0 refills | Status: AC

## 2019-08-24 MED ORDER — TRAMADOL HCL 50 MG PO TABS: 50.0000 mg | ORAL_TABLET | Freq: Four times a day (QID) | ORAL | 0 refills | Status: DC | PRN

## 2019-08-24 MED ORDER — HEPARIN SOD (PORK) LOCK FLUSH 100 UNIT/ML IV SOLN
250.0000 [IU] | INTRAVENOUS | Status: AC | PRN
  Administered 2019-08-24: 17:00:00 250 [IU]
  Filled 2019-08-24: qty 2.5

## 2019-08-24 MED ORDER — OXYCODONE HCL 5 MG PO TABS: 5.0000 mg | ORAL_TABLET | Freq: Four times a day (QID) | ORAL | 0 refills | Status: DC | PRN

## 2019-08-24 MED ORDER — ASPIRIN 325 MG PO TBEC: 325.0000 mg | DELAYED_RELEASE_TABLET | Freq: Two times a day (BID) | ORAL | 0 refills | Status: DC

## 2019-08-24 NOTE — Progress Notes (Signed)
Subjective: 2 Days Post-Op Procedure(s) (LRB): Right knee resection arthroplasty; antibiotic spacer (Right) Patient reports pain as mild.   Patient seen in rounds for Dr. Wynelle Link. Patient is well, and has had no acute complaints or problems other than discomfort in the right knee. No acute events overnight. He was able to ambulate 120 feet yesterday with PT. PICC line placed yesterday. Voiding without difficulty, positive flatus. Denies CP, SHOB, N/V.  Plan is to go Home after hospital stay.  Objective: Vital signs in last 24 hours: Temp:  [97.8 F (36.6 C)-98.3 F (36.8 C)] 97.8 F (36.6 C) (10/21 0539) Pulse Rate:  [56-67] 56 (10/21 0539) Resp:  [17-20] 20 (10/21 0539) BP: (141-166)/(71-78) 166/78 (10/21 0539) SpO2:  [99 %-100 %] 100 % (10/21 0539)  Intake/Output from previous day:  Intake/Output Summary (Last 24 hours) at 08/24/2019 0803 Last data filed at 08/24/2019 0600 Gross per 24 hour  Intake 1572.25 ml  Output 2075 ml  Net -502.75 ml    Intake/Output this shift: No intake/output data recorded.  Labs: Recent Labs    08/23/19 0318 08/24/19 0347  HGB 10.8* 10.2*   Recent Labs    08/23/19 0318 08/24/19 0347  WBC 8.4 9.4  RBC 3.99* 3.78*  HCT 33.8* 31.7*  PLT 338 351   Recent Labs    08/23/19 0318 08/24/19 0347  NA 137 138  K 4.5 4.1  CL 109 108  CO2 20* 23  BUN 27* 22  CREATININE 1.37* 1.31*  GLUCOSE 187* 121*  CALCIUM 8.2* 8.2*   No results for input(s): LABPT, INR in the last 72 hours.  Exam: General - Patient is Alert and Oriented Extremity - Neurologically intact Sensation intact distally Intact pulses distally Dorsiflexion/Plantar flexion intact Dressing/Incision - clean Motor Function - intact, moving foot and toes well on exam.   Past Medical History:  Diagnosis Date  . Arthritis   . Cataract   . Chronic kidney disease    Stage 3 per pt  . Diabetes mellitus without complication (HCC)    diet controlled  . Dizziness   .  Dry eyes   . Edema    Bilateral legs  . Fecal incontinence   . Generalized weakness   . GERD (gastroesophageal reflux disease)   . Glaucoma   . Headache   . History of chest pain    Saw cardiologist, no blockages noted, possible gas pains  . Hyperlipidemia   . Hypertension   . Idiopathic peripheral neuropathy   . Insomnia   . OSA on CPAP   . Prostate cancer ([place])   . Urinary urgency   . Vitamin D deficiency   . Wears glasses   . Wears hearing aid in both ears   . Wears partial dentures     Assessment/Plan: 2 Days Post-Op Procedure(s) (LRB): Right knee resection arthroplasty; antibiotic spacer (Right) Principal Problem:   Septic arthritis of knee, right (HCC)  Estimated body mass index is 29.9 kg/m as calculated from the following:   Height as of this encounter: 5' 11.5" (1.816 m).   Weight as of this encounter: 98.6 kg. Advance diet Up with therapy D/C IV fluids  DVT Prophylaxis - Aspirin Weight-bearing as tolerated, ROM allowed Hemovac pulled without difficulty  Plan for discharge home today following 1-2 sessions of therapy as long as he is meeting his goals and home IV antibiotics are arranged. PICC line placed yesterday. Infectious disease evaluated the patient, and has recommended IV Ancef.  Vancomycin discontinued yesterday. Follow up in  the office in 2 weeks.   Griffith Citron, PA-C Orthopedic Surgery [phone] 08/24/2019, 8:03 AM

## 2019-08-24 NOTE — Progress Notes (Signed)
Physical Therapy Treatment Patient Details Name: Drew Hudson MRN: [id number] DOB: April 13, 1936 Today's Date: 08/24/2019    History of Present Illness Pt is an 83 year old male s/p Right knee resection arthroplasty; antibiotic spacer with PMHx of R TKA 10/18/18, OA, cataracts, glaucoma, HTN, peripheral neuroapthy, OSA    PT Comments    Pt making good progress with mobility.  Demonstrates safe gait, transfers, and stairs in order to return home with family from PT perspective.  R knee ROM remains limited by pain and pt needs cues for technique with therapeutic exercises.  Will cont to benefit from skilled PT while in hospital and at d/c.     Follow Up Recommendations  Follow surgeon's recommendation for DC plan and follow-up therapies     Equipment Recommendations  None recommended by PT    Recommendations for Other Services       Precautions / Restrictions Precautions Precautions: Knee;Fall Precaution Comments: full ROM per ortho notes Required Braces or Orthoses: Knee Immobilizer - Right Restrictions Weight Bearing Restrictions: No RLE Weight Bearing: Weight bearing as tolerated    Mobility  Bed Mobility Overal bed mobility: Modified Independent Bed Mobility: Supine to Sit;Sit to Supine     Supine to sit: Supervision Sit to supine: Supervision   General bed mobility comments: pt self assisted R LE with UEs  Transfers Overall transfer level: Needs assistance Equipment used: Rolling walker (2 wheeled) Transfers: Sit to/from Stand Sit to Stand: Min guard         General transfer comment: cues to extend R leg when sitting; pt demonstrated use of armrest  Ambulation/Gait Ambulation/Gait assistance: Min guard Gait Distance (Feet): 200 Feet Assistive device: Rolling walker (2 wheeled) Gait Pattern/deviations: Decreased stance time - right     General Gait Details: cued for RW placement; intial step to R gait progressed to partial reciprocal with  cues   Stairs Stairs: Yes Stairs assistance: Min guard Stair Management: Two rails;Step to pattern Number of Stairs: 5 General stair comments: cued for sequence   Wheelchair Mobility    Modified Rankin (Stroke Patients Only)       Balance Overall balance assessment: Needs assistance   Sitting balance-Leahy Scale: Normal     Standing balance support: Bilateral upper extremity supported Standing balance-Leahy Scale: Good                              Cognition Arousal/Alertness: Awake/alert Behavior During Therapy: WFL for tasks assessed/performed Overall Cognitive Status: Within Functional Limits for tasks assessed                                        Exercises Total Joint Exercises Ankle Circles/Pumps: AROM;Both;10 reps;Seated Quad Sets: AROM;Right;10 reps;Supine Towel Squeeze: AROM;Both;10 reps;Supine Heel Slides: AAROM;Right;10 reps;Supine Hip ABduction/ADduction: AROM;Right;10 reps;Supine Straight Leg Raises: AAROM;Right;10 reps;Supine Goniometric ROM: R knee lacking 5 degrees ext; 45 flex; limited by pain    General Comments        Pertinent Vitals/Pain Pain Score: 6 (0/10 rest; 6/10 activity) Pain Location: right knee Pain Descriptors / Indicators: Burning Pain Intervention(s): Limited activity within patient's tolerance;Ice applied    Home Living                      Prior Function            PT Goals (current  goals can now be found in the care plan section) Progress towards PT goals: Progressing toward goals    Frequency    7X/week      PT Plan Current plan remains appropriate    Co-evaluation              AM-PAC PT "6 Clicks" Mobility   Outcome Measure  Help needed turning from your back to your side while in a flat bed without using bedrails?: None Help needed moving from lying on your back to sitting on the side of a flat bed without using bedrails?: None Help needed moving to and  from a bed to a chair (including a wheelchair)?: A Little Help needed standing up from a chair using your arms (e.g., wheelchair or bedside chair)?: A Little Help needed to walk in hospital room?: A Little Help needed climbing 3-5 steps with a railing? : A Little 6 Click Score: 20    End of Session Equipment Utilized During Treatment: Gait belt;Right knee immobilizer Activity Tolerance: Patient tolerated treatment well Patient left: with chair alarm set;in chair;with call bell/phone within reach   PT Visit Diagnosis: Other abnormalities of gait and mobility (R26.89);Muscle weakness (generalized) (M62.81)     Time: [id number] PT Time Calculation (min) (ACUTE ONLY): 36 min  Charges:  $Gait Training: 8-22 mins $Therapeutic Exercise: 8-22 mins                     Maggie Font, PT Acute Rehab Services [phone]    Karlton Lemon 08/24/2019, 11:50 AM

## 2019-08-24 NOTE — TOC Transition Note (Signed)
Transition of Care [facility]) - CM/SW Discharge Note   Patient Details  Name: Drew Hudson MRN: [id number] Date of Birth: 04/28/1936  Transition of Care [facility]) CM/SW Contact:  Leeroy Cha, RN Phone Number: 08/24/2019, 10:44 AM   Clinical Narrative:    Home hea;th through [facility] for iv abx   Final next level of care: [place] Barriers to Discharge: No Barriers Identified   Patient Goals and CMS Choice     Choice offered to / list presented to : Patient  Discharge Placement                       Discharge Plan and Services   Discharge Planning Services: CM Consult Post Acute Care Choice: Home Health                    HH Arranged: RN, PT, IV Antibiotics HH Agency: [place] ([facility]) Date [facility] Agency Contacted: 08/24/19 Time HH Agency Contacted: 0900 Representative spoke with at [place]: [place] ([place]) Interventions     Readmission Risk Interventions No flowsheet data found.

## 2019-08-24 NOTE — Progress Notes (Addendum)
INFECTIOUS DISEASE PROGRESS NOTE  ID: Drew Hudson is a 83 y.o. male with  Principal Problem:   Septic arthritis of knee, right (HCC)  Subjective: No complaints.   Abtx:  Anti-infectives (From admission, onward)   Start     Dose/Rate Route Frequency Ordered Stop   08/24/19 0000  ceFAZolin (ANCEF) IVPB     2 g Intravenous Every 8 hours 08/24/19 0817 10/03/19 2359   08/23/19 2100  vancomycin (VANCOCIN) IVPB 750 mg/150 ml premix  Status:  Discontinued     750 mg 150 mL/hr over 60 Minutes Intravenous Every 24 hours 08/22/19 1957 08/23/19 0813   08/23/19 2100  vancomycin (VANCOCIN) IVPB 1000 mg/200 mL premix  Status:  Discontinued     1,000 mg 200 mL/hr over 60 Minutes Intravenous Every 24 hours 08/23/19 0813 08/23/19 1036   08/23/19 1400  ceFAZolin (ANCEF) IVPB 2g/100 mL premix     2 g 200 mL/hr over 30 Minutes Intravenous Every 8 hours 08/23/19 1121     08/22/19 2030  vancomycin (VANCOCIN) IVPB 1000 mg/200 mL premix     1,000 mg 200 mL/hr over 60 Minutes Intravenous  Once 08/22/19 1953 08/22/19 2123   08/22/19 1400  vancomycin (VANCOCIN) IVPB 1000 mg/200 mL premix     1,000 mg 200 mL/hr over 60 Minutes Intravenous On call to O.R. 08/22/19 1354 08/22/19 1630      Medications:  Scheduled: . amLODipine  10 mg Oral Daily  . aspirin EC  325 mg Oral BID  . atorvastatin  20 mg Oral q1800  . Chlorhexidine Gluconate Cloth  6 each Topical Daily  . docusate sodium  100 mg Oral BID  . dorzolamide-timolol  1 drop Both Eyes BID  . latanoprost  1 drop Both Eyes QHS  . pantoprazole  40 mg Oral Daily  . sodium chloride flush  10-40 mL Intracatheter Q12H    Objective: Vital signs in last 24 hours: Temp:  [97.8 F (36.6 C)-98.3 F (36.8 C)] 97.8 F (36.6 C) (10/21 0539) Pulse Rate:  [56-67] 56 (10/21 0539) Resp:  [17-20] 20 (10/21 0539) BP: (141-166)/(71-78) 166/78 (10/21 0539) SpO2:  [99 %-100 %] 100 % (10/21 0539)   General appearance: alert, cooperative and no distress  Extremities: RUE PIC, RLE wrapped.   Lab Results Recent Labs    08/23/19 0318 08/24/19 0347  WBC 8.4 9.4  HGB 10.8* 10.2*  HCT 33.8* 31.7*  NA 137 138  K 4.5 4.1  CL 109 108  CO2 20* 23  BUN 27* 22  CREATININE 1.37* 1.31*   Liver Panel No results for input(s): PROT, ALBUMIN, AST, ALT, ALKPHOS, BILITOT, BILIDIR, IBILI in the last 72 hours. Sedimentation Rate Recent Labs    08/23/19 1056  ESRSEDRATE 52*   C-Reactive Protein Recent Labs    08/23/19 1056  CRP 12.3*    Microbiology: Recent Results (from the past 240 hour(s))  Surgical pcr screen     Status: None   Collection Time: 08/19/19  9:37 AM   Specimen: Nasal Mucosa; Nasal Swab  Result Value Ref Range Status   MRSA, PCR NEGATIVE NEGATIVE Final   Staphylococcus aureus NEGATIVE NEGATIVE Final    Comment: (NOTE) The Xpert SA Assay (FDA approved for NASAL specimens in patients 83 years of age and older), is one component of a comprehensive surveillance program. It is not intended to diagnose infection nor to guide or monitor treatment. Performed at [facility], [place] [address], [place], [place] [address]   Novel Coronavirus,  NAA (Hosp order, Send-out to Ref Lab; TAT 18-24 hrs     Status: None   Collection Time: 08/19/19 10:33 AM   Specimen: Nasopharyngeal Swab; Respiratory  Result Value Ref Range Status   SARS-CoV-2, NAA NOT DETECTED NOT DETECTED Final    Comment: (NOTE) This nucleic acid amplification test was developed and its performance characteristics determined by [employer]. Nucleic acid amplification tests include PCR and TMA. This test has not been FDA cleared or approved. This test has been authorized by FDA under an Emergency Use Authorization (EUA). This test is only authorized for the duration of time the declaration that circumstances exist justifying the authorization of the emergency use of in vitro diagnostic tests for detection of SARS-CoV-2 virus and/or  diagnosis of COVID-19 infection under section 564(b)(1) of the Act, 21 U.S.C. 473GYL-6(D) (1), unless the authorization is terminated or revoked sooner. When diagnostic testing is negative, the possibility of a false negative result should be considered in the context of a patient's recent exposures and the presence of clinical signs and symptoms consistent with COVID-19. An individual without symptoms of COVID- 19 and who is not shedding SARS-CoV-2 vi rus would expect to have a negative (not detected) result in this assay. Performed At: [facility] [address] [place], [place] [id number] Rush Farmer MD [id number]    [place]  Final    Comment: Performed at [facility], [place] [address], [place], [place] [address]    Studies/Results: [place] Ekg Site Rite  Result Date: 08/23/2019 If [facility] image not attached, placement could not be confirmed due to current cardiac rhythm.    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 08-23-19 Diabetic control Watch Cr (improved) My great appreciation to ortho for placing Cx results in chart Available as needed.    Allergies  Allergen Reactions  . Lovastatin Itching  . Penicillins Rash    Has patient had a PCN reaction causing immediate rash, facial/tongue/throat swelling, SOB or lightheadedness with hypotension: No Has patient had a PCN reaction causing severe rash involving mucus membranes or skin necrosis: No Has patient had a PCN reaction that required hospitalization: No Has patient had a PCN reaction occurring within the last 10 years: Yes If all of the above answers are "NO", then may proceed with Cephalosporin use.     OPAT Orders Discharge antibiotics: ancef 2g q8h ivpb Duration:  42 days End Date: 10-04-19  [facility] Care Per Protocol: please  Labs weekly while on IV antibiotics: _x_ CBC with differential __ BMP _x_ CMP x__ CRP _x_ ESR __ Vancomycin trough __ CK  _x_ Please pull PIC at completion of IV antibiotics __ Please leave PIC in place until doctor has seen patient or been notified  Fax weekly labs to [phone]  Clinic Follow Up Appt: [place] 4 weeks         Bobby Rumpf MD, FACP Infectious Diseases (pager) [phone] [email or web address] 08/24/2019, 8:44 AM  LOS: 2 days

## 2019-08-25 NOTE — Discharge Summary (Signed)
Physician Discharge Summary   Patient ID: Drew Hudson MRN: [id number] DOB/AGE: 81-09-1936 81 y.o.  Admit date: 08/22/2019 Discharge date: 08/24/2019  Primary Diagnosis: Periprosthetic joint infection, right total knee.  Admission Diagnoses:  Past Medical History:  Diagnosis Date  . Arthritis   . Cataract   . Chronic kidney disease    Stage 3 per pt  . Diabetes mellitus without complication (HCC)    diet controlled  . Dizziness   . Dry eyes   . Edema    Bilateral legs  . Fecal incontinence   . Generalized weakness   . GERD (gastroesophageal reflux disease)   . Glaucoma   . Headache   . History of chest pain    Saw cardiologist, no blockages noted, possible gas pains  . Hyperlipidemia   . Hypertension   . Idiopathic peripheral neuropathy   . Insomnia   . OSA on CPAP   . Prostate cancer (Drew Hudson)   . Urinary urgency   . Vitamin D deficiency   . Wears glasses   . Wears hearing aid in both ears   . Wears partial dentures    Discharge Diagnoses:   Principal Problem:   Septic arthritis of knee, right ([name])  Estimated body mass index is 29.9 kg/m as calculated from the following:   Height as of this encounter: 5' 11.5" (1.816 m).   Weight as of this encounter: 98.6 kg.  Procedure:  Procedure(s) (LRB): Right knee resection arthroplasty; antibiotic spacer (Right)   Consults: Infectious Disease  HPI: The patient is an 83 year old male who had a right total knee arthroplasty done in December 2019.  He was doing great up until about 4 weeks ago when he started to develop pain and swelling in the knee.  He presented to the office  last week when he started to have warmth about the knee with increased pain and fever.  He was afebrile on presentation to the office, but did have a warm, swollen knee.  Aspiration revealed purulent fluid, which grew Staphylococcus lugdunensis.  He presents now for resection arthroplasty with antibiotic spacer placement.  Laboratory Data:  Admission on 08/22/2019, Discharged on 08/24/2019  Component Date Value Ref Range Status  . Glucose-Capillary 08/22/2019 95  70 - 99 mg/dL Final  . Comment 1 08/22/2019 Notify RN   Final  . Comment 2 08/22/2019 Document in Chart   Final  . Glucose-Capillary 08/22/2019 98  70 - 99 mg/dL Final  . WBC 08/23/2019 8.4  4.0 - 10.5 K/uL Final  . RBC 08/23/2019 3.99* 4.22 - 5.81 MIL/uL Final  . Hemoglobin 08/23/2019 10.8* 13.0 - 17.0 g/dL Final  . HCT 08/23/2019 33.8* 39.0 - 52.0 % Final  . MCV 08/23/2019 84.7  80.0 - 100.0 fL Final  . MCH 08/23/2019 27.1  26.0 - 34.0 pg Final  . MCHC 08/23/2019 32.0  30.0 - 36.0 g/dL Final  . RDW 08/23/2019 13.2  11.5 - 15.5 % Final  . Platelets 08/23/2019 338  150 - 400 K/uL Final  . nRBC 08/23/2019 0.0  0.0 - 0.2 % Final   Performed at [facility], [place] [address] [place], [place] [address]  . Sodium 08/23/2019 137  135 - 145 mmol/L Final  . Potassium 08/23/2019 4.5  3.5 - 5.1 mmol/L Final  . Chloride 08/23/2019 109  98 - 111 mmol/L Final  . CO2 08/23/2019 20* 22 - 32 mmol/L Final  . Glucose, Bld 08/23/2019 187* 70 - 99 mg/dL Final  . BUN 08/23/2019 27*  8 - 23 mg/dL Final  . Creatinine, Ser 08/23/2019 1.37* 0.61 - 1.24 mg/dL Final  . Calcium 08/23/2019 8.2* 8.9 - 10.3 mg/dL Final  . GFR calc non Af Amer 08/23/2019 48* >60 mL/min Final  . GFR calc Af Amer 08/23/2019 56* >60 mL/min Final  . Anion gap 08/23/2019 8  5 - 15 Final   Performed at [facility], [place] [address] [place], [place] [address]  . Glucose-Capillary 08/22/2019 190* 70 - 99 mg/dL Final  . Glucose-Capillary 08/23/2019 135* 70 - 99 mg/dL Final  . CRP 08/23/2019 12.3* <1.0 mg/dL Final   Performed at [facility], [place] [address], [place], [place] [address]  . Sed Rate 08/23/2019 52* 0 - 16 mm/hr Final   Performed at [facility] [address] [place], [place] [address]  . Glucose-Capillary 08/23/2019 136* 70 -  99 mg/dL Final  . WBC 08/24/2019 9.4  4.0 - 10.5 K/uL Final  . RBC 08/24/2019 3.78* 4.22 - 5.81 MIL/uL Final  . Hemoglobin 08/24/2019 10.2* 13.0 - 17.0 g/dL Final  . HCT 08/24/2019 31.7* 39.0 - 52.0 % Final  . MCV 08/24/2019 83.9  80.0 - 100.0 fL Final  . MCH 08/24/2019 27.0  26.0 - 34.0 pg Final  . MCHC 08/24/2019 32.2  30.0 - 36.0 g/dL Final  . RDW 08/24/2019 13.0  11.5 - 15.5 % Final  . Platelets 08/24/2019 351  150 - 400 K/uL Final  . nRBC 08/24/2019 0.0  0.0 - 0.2 % Final   Performed at [facility], [place] [address] [place], [place] [address]  . Sodium 08/24/2019 138  135 - 145 mmol/L Final  . Potassium 08/24/2019 4.1  3.5 - 5.1 mmol/L Final  . Chloride 08/24/2019 108  98 - 111 mmol/L Final  . CO2 08/24/2019 23  22 - 32 mmol/L Final  . Glucose, Bld 08/24/2019 121* 70 - 99 mg/dL Final  . BUN 08/24/2019 22  8 - 23 mg/dL Final  . Creatinine, Ser 08/24/2019 1.31* 0.61 - 1.24 mg/dL Final  . Calcium 08/24/2019 8.2* 8.9 - 10.3 mg/dL Final  . GFR calc non Af Amer 08/24/2019 51* >60 mL/min Final  . GFR calc Af Amer 08/24/2019 59* >60 mL/min Final  . Anion gap 08/24/2019 7  5 - 15 Final   Performed at [facility], [place] [address] [place], [place] [address]  . Glucose-Capillary 08/23/2019 166* 70 - 99 mg/dL Final  . Comment 1 08/23/2019 Notify RN   Final  . Comment 2 08/23/2019 Document in Chart   Final  Hospital Outpatient Visit on 08/19/2019  Component Date Value Ref Range Status  . SARS-CoV-2, NAA 08/19/2019 NOT DETECTED  NOT DETECTED Final   Comment: (NOTE) This nucleic acid amplification test was developed and its performance characteristics determined by [employer]. Nucleic acid amplification tests include PCR and TMA. This test has not been FDA cleared or approved. This test has been authorized by FDA under an Emergency Use Authorization (EUA). This test is only authorized for the duration of time the declaration that  circumstances exist justifying the authorization of the emergency use of in vitro diagnostic tests for detection of SARS-CoV-2 virus and/or diagnosis of COVID-19 infection under section 564(b)(1) of the Act, 21 U.S.C. 638GYK-5(L) (1), unless the authorization is terminated or revoked sooner. When diagnostic testing is negative, the possibility of a false negative result should be considered in the context of a patient's recent exposures and the presence of  clinical signs and symptoms consistent with COVID-19. An individual without symptoms of COVID- 19 and who is not shedding SARS-CoV-2 vi                          rus would expect to have a negative (not detected) result in this assay. Performed At: [facility] [place], [place] [id number] Rush Farmer MD GO:[id number]   . Coronavirus Source 08/19/2019 NASOPHARYNGEAL   Final   Performed at [facility], [place] [address], [place], [place] [address]  Hospital Outpatient Visit on 08/19/2019  Component Date Value Ref Range Status  . aPTT 08/19/2019 38* 24 - 36 seconds Final   Comment:        IF BASELINE aPTT IS ELEVATED, SUGGEST PATIENT RISK ASSESSMENT BE USED TO DETERMINE APPROPRIATE ANTICOAGULANT THERAPY. Performed at [facility], [place] [address] [place], [place] [address]   . WBC 08/19/2019 6.7  4.0 - 10.5 K/uL Final  . RBC 08/19/2019 4.47  4.22 - 5.81 MIL/uL Final  . Hemoglobin 08/19/2019 12.0* 13.0 - 17.0 g/dL Final  . HCT 08/19/2019 38.1* 39.0 - 52.0 % Final  . MCV 08/19/2019 85.2  80.0 - 100.0 fL Final  . MCH 08/19/2019 26.8  26.0 - 34.0 pg Final  . MCHC 08/19/2019 31.5  30.0 - 36.0 g/dL Final  . RDW 08/19/2019 13.3  11.5 - 15.5 % Final  . Platelets 08/19/2019 329  150 - 400 K/uL Final  . nRBC 08/19/2019 0.0  0.0 - 0.2 % Final  . Neutrophils Relative % 08/19/2019 63  % Final  . Neutro Abs 08/19/2019 4.2  1.7 - 7.7 K/uL Final  . Lymphocytes Relative 08/19/2019 21  % Final  .  Lymphs Abs 08/19/2019 1.4  0.7 - 4.0 K/uL Final  . Monocytes Relative 08/19/2019 13  % Final  . Monocytes Absolute 08/19/2019 0.9  0.1 - 1.0 K/uL Final  . Eosinophils Relative 08/19/2019 2  % Final  . Eosinophils Absolute 08/19/2019 0.1  0.0 - 0.5 K/uL Final  . Basophils Relative 08/19/2019 1  % Final  . Basophils Absolute 08/19/2019 0.1  0.0 - 0.1 K/uL Final  . Immature Granulocytes 08/19/2019 0  % Final  . Abs Immature Granulocytes 08/19/2019 0.02  0.00 - 0.07 K/uL Final   Performed at [facility], [place] [address] [place], [place] [address]  . Sodium 08/19/2019 140  135 - 145 mmol/L Final  . Potassium 08/19/2019 4.1  3.5 - 5.1 mmol/L Final  . Chloride 08/19/2019 109  98 - 111 mmol/L Final  . CO2 08/19/2019 23  22 - 32 mmol/L Final  . Glucose, Bld 08/19/2019 113* 70 - 99 mg/dL Final  . BUN 08/19/2019 25* 8 - 23 mg/dL Final  . Creatinine, Ser 08/19/2019 1.64* 0.61 - 1.24 mg/dL Final  . Calcium 08/19/2019 8.8* 8.9 - 10.3 mg/dL Final  . Total Protein 08/19/2019 7.1  6.5 - 8.1 g/dL Final  . Albumin 08/19/2019 2.9* 3.5 - 5.0 g/dL Final  . AST 08/19/2019 24  15 - 41 U/L Final  . ALT 08/19/2019 27  0 - 44 U/L Final  . Alkaline Phosphatase 08/19/2019 58  38 - 126 U/L Final  . Total Bilirubin 08/19/2019 0.9  0.3 - 1.2 mg/dL Final  . GFR calc non Af Amer 08/19/2019 39* >60 mL/min Final  . GFR calc Af Amer 08/19/2019 45* >60 mL/min Final  . Anion gap 08/19/2019 8  5 -  15 Final   Performed at [facility], [place] [address], [place], [place] [address]  . Prothrombin Time 08/19/2019 15.4* 11.4 - 15.2 seconds Final  . INR 08/19/2019 1.2  0.8 - 1.2 Final   Comment: (NOTE) INR goal varies based on device and disease states. Performed at [facility], [place] [address], [place], [place] [address]   . ABO/RH(D) 08/19/2019 O POS   Final  . Antibody Screen 08/19/2019 NEG   Final  . Sample Expiration 08/19/2019 08/25/2019,2359   Final  . Extend  sample reason 08/19/2019    Final                   Value:NO TRANSFUSIONS OR PREGNANCY IN THE PAST 3 MONTHS Performed at [facility], [place] [address], [place], [place] [address]   . MRSA, PCR 08/19/2019 NEGATIVE  NEGATIVE Final  . Staphylococcus aureus 08/19/2019 NEGATIVE  NEGATIVE Final   Comment: (NOTE) The Xpert SA Assay (FDA approved for NASAL specimens in patients 26 years of age and older), is one component of a comprehensive surveillance program. It is not intended to diagnose infection nor to guide or monitor treatment. Performed at [facility], [place] [address], [place], [place] [address]   . Glucose-Capillary 08/19/2019 111* 70 - 99 mg/dL Final  . Hgb A1c MFr Bld 08/19/2019 6.4* 4.8 - 5.6 % Final   Comment: (NOTE) Pre diabetes:          5.7%-6.4% Diabetes:              >6.4% Glycemic control for   <7.0% adults with diabetes   . Mean Plasma Glucose 08/19/2019 136.98  mg/dL Final   Performed at [place] [address] [place],  [address]     X-Rays:Us Ekg Site Rite  Result Date: 08/23/2019 If [facility] image not attached, placement could not be confirmed due to current cardiac rhythm.   EKG: Orders placed or performed during the hospital encounter of 08/19/19  . EKG  . EKG     Hospital Course: Drew Hudson is a 83 y.o. who was admitted to [facility]. They were brought to the operating room on 08/22/2019 and underwent Procedure(s): Right knee resection arthroplasty; antibiotic spacer.  Patient tolerated the procedure well and was later transferred to the recovery room and then to the orthopaedic floor for postoperative care. They were given PO and IV analgesics for pain control following their surgery. They were given 24 hours of postoperative antibiotics of  Anti-infectives (From admission, onward)   Start     Dose/Rate Route Frequency Ordered Stop   08/24/19 0000  ceFAZolin (ANCEF) IVPB     2 g  Intravenous Every 8 hours 08/24/19 0817 10/03/19 2359   08/23/19 2100  vancomycin (VANCOCIN) IVPB 750 mg/150 ml premix  Status:  Discontinued     750 mg 150 mL/hr over 60 Minutes Intravenous Every 24 hours 08/22/19 1957 08/23/19 0813   08/23/19 2100  vancomycin (VANCOCIN) IVPB 1000 mg/200 mL premix  Status:  Discontinued     1,000 mg 200 mL/hr over 60 Minutes Intravenous Every 24 hours 08/23/19 0813 08/23/19 1036   08/23/19 1400  ceFAZolin (ANCEF) IVPB 2g/100 mL premix  Status:  Discontinued     2 g 200 mL/hr over 30 Minutes Intravenous Every 8 hours 08/23/19 1121 08/24/19 2056   08/22/19 2030  vancomycin (VANCOCIN) IVPB 1000 mg/200 mL premix     1,000 mg 200 mL/hr  over 60 Minutes Intravenous  Once 08/22/19 1953 08/22/19 2123   08/22/19 1400  vancomycin (VANCOCIN) IVPB 1000 mg/200 mL premix     1,000 mg 200 mL/hr over 60 Minutes Intravenous On call to O.R. 08/22/19 1354 08/22/19 1630     and started on DVT prophylaxis in the form of Aspirin.   PT and OT were ordered for gait training and ROM. Discharge planning consulted to help with postop disposition and equipment needs. Patient had a good night on the evening of surgery. They started to get up OOB with therapy on POD #1. Infectious disease was consulted for evaluation and antibiotic recommendations. Infectious disease discontinued IV Vancomycin, and switched to IV Ancef. PICC line placed. Continued to work with therapy into POD #2. Pt was seen during rounds on day two and was ready to go home pending progress with therapy. Hemovac drain was pulled without difficulty on day two. Dressing was changed and the incision was clean and intact. Pt worked with therapy for one additional session and was meeting their goals. He was discharged to home later that day in stable condition.  Diet: Regular diet Activity: WBAT Follow-up: in 2 weeks Disposition: Home Discharged Condition: good   Discharge Instructions    Call MD / Call 911   Complete by:  As directed    If you experience chest pain or shortness of breath, CALL 911 and be transported to the hospital emergency room.  If you develope a fever above 101 F, pus (white drainage) or increased drainage or redness at the wound, or calf pain, call your surgeon's office.   Change dressing   Complete by: As directed    Change dressing on Thursday, then change the dressing daily with sterile 4 x 4 inch gauze dressing and apply TED hose.   Constipation Prevention   Complete by: As directed    Drink plenty of fluids.  Prune juice may be helpful.  You may use a stool softener, such as Colace (over the counter) 100 mg twice a day.  Use MiraLax (over the counter) for constipation as needed.   Diet - low sodium heart healthy   Complete by: As directed    Discharge instructions   Complete by: As directed    Dr. Gaynelle Arabian Total Joint Specialist [facility] [address], [place], [place] [address] [phone]  TOTAL KNEE REPLACEMENT POSTOPERATIVE DIRECTIONS  Knee Rehabilitation, Guidelines Following Surgery  Results after knee surgery are often greatly improved when you follow the exercise, range of motion and muscle strengthening exercises prescribed by your doctor. Safety measures are also important to protect the knee from further injury. Any time any of these exercises cause you to have increased pain or swelling in your knee joint, decrease the amount until you are comfortable again and slowly increase them. If you have problems or questions, call your caregiver or physical therapist for advice.   HOME CARE INSTRUCTIONS  Remove items at home which could result in a fall. This includes throw rugs or furniture in walking pathways.  ICE to the affected knee every three hours for 30 minutes at a time and then as needed for pain and swelling.  Continue to use ice on the knee for pain and swelling from surgery. You may notice swelling that will progress down to the foot and  ankle.  This is normal after surgery.  Elevate the leg when you are not up walking on it.   Continue to use the breathing machine  which will help keep your temperature down.  It is common for your temperature to cycle up and down following surgery, especially at night when you are not up moving around and exerting yourself.  The breathing machine keeps your lungs expanded and your temperature down. Do not place pillow under knee, focus on keeping the knee straight while resting   DIET You may resume your previous home diet once your are discharged from the hospital.  DRESSING / WOUND CARE / SHOWERING You may change your dressing 3-5 days after surgery.  Then change the dressing every day with sterile gauze.  Please use good hand washing techniques before changing the dressing.  Do not use any lotions or creams on the incision until instructed by your surgeon. You may start showering once you are discharged home but do not submerge the incision under water. Just pat the incision dry and apply a dry gauze dressing on daily. Change the surgical dressing daily and reapply a dry dressing each time.  ACTIVITY Walk with your walker as instructed. Use walker as long as suggested by your caregivers. Avoid periods of inactivity such as sitting longer than an hour when not asleep. This helps prevent blood clots.  You may resume a sexual relationship in one month or when given the OK by your doctor.  You may return to work once you are cleared by your doctor.  Do not drive a car for 6 weeks or until released by you surgeon.  Do not drive while taking narcotics.  WEIGHT BEARING Weight bearing as tolerated with assist device (walker, cane, etc) as directed, use it as long as suggested by your surgeon or therapist, typically at least 4-6 weeks.  POSTOPERATIVE CONSTIPATION PROTOCOL Constipation - defined medically as fewer than three stools per week and severe constipation as less than one stool per week.   One of the most common issues patients have following surgery is constipation.  Even if you have a regular bowel pattern at home, your normal regimen is likely to be disrupted due to multiple reasons following surgery.  Combination of anesthesia, postoperative narcotics, change in appetite and fluid intake all can affect your bowels.  In order to avoid complications following surgery, here are some recommendations in order to help you during your recovery period.  Colace (docusate) - Pick up an over-the-counter form of Colace or another stool softener and take twice a day as long as you are requiring postoperative pain medications.  Take with a full glass of water daily.  If you experience loose stools or diarrhea, hold the colace until you stool forms back up.  If your symptoms do not get better within 1 week or if they get worse, check with your doctor.  Dulcolax (bisacodyl) - Pick up over-the-counter and take as directed by the product packaging as needed to assist with the movement of your bowels.  Take with a full glass of water.  Use this product as needed if not relieved by Colace only.   MiraLax (polyethylene glycol) - Pick up over-the-counter to have on hand.  MiraLax is a solution that will increase the amount of water in your bowels to assist with bowel movements.  Take as directed and can mix with a glass of water, juice, soda, coffee, or tea.  Take if you go more than two days without a movement. Do not use MiraLax more than once per day. Call your doctor if you are still constipated or irregular after using this medication for  7 days in a row.  If you continue to have problems with postoperative constipation, please contact the office for further assistance and recommendations.  If you experience "the worst abdominal pain ever" or develop nausea or vomiting, please contact the office immediatly for further recommendations for treatment.  ITCHING  If you experience itching with your  medications, try taking only a single pain pill, or even half a pain pill at a time.  You can also use Benadryl over the counter for itching or also to help with sleep.   TED HOSE STOCKINGS Wear the elastic stockings on both legs for three weeks following surgery during the day but you may remove then at night for sleeping.  MEDICATIONS See your medication summary on the "After Visit Summary" that the nursing staff will review with you prior to discharge.  You may have some home medications which will be placed on hold until you complete the course of blood thinner medication.  It is important for you to complete the blood thinner medication as prescribed by your surgeon.  Continue your approved medications as instructed at time of discharge.  PRECAUTIONS If you experience chest pain or shortness of breath - call 911 immediately for transfer to the hospital emergency department.  If you develop a fever greater that 101 F, purulent drainage from wound, increased redness or drainage from wound, foul odor from the wound/dressing, or calf pain - CONTACT YOUR SURGEON.                                                   FOLLOW-UP APPOINTMENTS Make sure you keep all of your appointments after your operation with your surgeon and caregivers. You should call the office at the above phone number and make an appointment for approximately two weeks after the date of your surgery or on the date instructed by your surgeon outlined in the "After Visit Summary".   RANGE OF MOTION AND STRENGTHENING EXERCISES  Rehabilitation of the knee is important following a knee injury or an operation. After just a few days of immobilization, the muscles of the thigh which control the knee become weakened and shrink (atrophy). Knee exercises are designed to build up the tone and strength of the thigh muscles and to improve knee motion. Often times heat used for twenty to thirty minutes before working out will loosen up your tissues  and help with improving the range of motion but do not use heat for the first two weeks following surgery. These exercises can be done on a training (exercise) mat, on the floor, on a table or on a bed. Use what ever works the best and is most comfortable for you Knee exercises include:  Leg Lifts - While your knee is still immobilized in a splint or cast, you can do straight leg raises. Lift the leg to 60 degrees, hold for 3 sec, and slowly lower the leg. Repeat 10-20 times 2-3 times daily. Perform this exercise against resistance later as your knee gets better.  Quad and Hamstring Sets - Tighten up the muscle on the front of the thigh (Quad) and hold for 5-10 sec. Repeat this 10-20 times hourly. Hamstring sets are done by pushing the foot backward against an object and holding for 5-10 sec. Repeat as with quad sets.  Leg Slides: Lying on your back, slowly slide  your foot toward your buttocks, bending your knee up off the floor (only go as far as is comfortable). Then slowly slide your foot back down until your leg is flat on the floor again. Angel Wings: Lying on your back spread your legs to the side as far apart as you can without causing discomfort.  A rehabilitation program following serious knee injuries can speed recovery and prevent re-injury in the future due to weakened muscles. Contact your doctor or a physical therapist for more information on knee rehabilitation.   IF YOU ARE TRANSFERRED TO A SKILLED REHAB FACILITY If the patient is transferred to a skilled rehab facility following release from the hospital, a list of the current medications will be sent to the facility for the patient to continue.  When discharged from the skilled rehab facility, please have the facility set up the patient's [place] prior to being released. Also, the skilled facility will be responsible for providing the patient with their medications at time of release from the facility to include their  pain medication, the muscle relaxants, and their blood thinner medication. If the patient is still at the rehab facility at time of the two week follow up appointment, the skilled rehab facility will also need to assist the patient in arranging follow up appointment in our office and any transportation needs.  MAKE SURE YOU:  Understand these instructions.  Get help right away if you are not doing well or get worse.    Pick up stool softner and laxative for home use following surgery while on pain medications. Do not submerge incision under water. Please use good hand washing techniques while changing dressing each day. May shower starting three days after surgery. Please use a clean towel to pat the incision dry following showers. Continue to use ice for pain and swelling after surgery. Do not use any lotions or creams on the incision until instructed by your surgeon.   Do not put a pillow under the knee. Place it under the heel.   Complete by: As directed    Driving restrictions   Complete by: As directed    No driving for two weeks   Home infusion instructions [facility] May follow [place] [facility] Dosing Protocol; May administer Cathflo as needed to maintain patency of vascular access device.; Flushing of vascular access device: per [facility] Protocol: 0.9% NaCl pre/post medica...   Complete by: As directed    Instructions: May follow [facility] Dosing Protocol   Instructions: May administer Cathflo as needed to maintain patency of vascular access device.   Instructions: Flushing of vascular access device: per [facility] Protocol: 0.9% NaCl pre/post medication administration and prn patency; Heparin 100 u/ml, 86m for implanted ports and Heparin 10u/ml, 539mfor all other central venous catheters.   Instructions: May follow [facility] Anaphylaxis Protocol for First Dose Administration in the home: 0.9% NaCl at 25-50 ml/hr to maintain IV access for protocol meds. Epinephrine 0.3 ml IV/IM PRN and Benadryl  25-50 IV/IM PRN s/s of anaphylaxis.   Instructions: [facility] Coordinator (RN) to assist per patient IV care needs in the home PRN.   TED hose   Complete by: As directed    Use stockings (TED hose) for three weeks on both leg(s).  You may remove them at night for sleeping.   Weight bearing as tolerated   Complete by: As directed      Allergies as of 08/24/2019      Reactions  Lovastatin Itching   Penicillins Rash   Has patient had a PCN reaction causing immediate rash, facial/tongue/throat swelling, SOB or lightheadedness with hypotension: No Has patient had a PCN reaction causing severe rash involving mucus membranes or skin necrosis: No Has patient had a PCN reaction that required hospitalization: No Has patient had a PCN reaction occurring within the last 10 years: Yes If all of the above answers are "NO", then may proceed with Cephalosporin use.      Medication List    STOP taking these medications   acetaminophen 325 MG tablet Commonly known as: TYLENOL   VITAMIN D (CHOLECALCIFEROL) PO   vitamin E 400 UNIT capsule     TAKE these medications   amLODipine 10 MG tablet Commonly known as: NORVASC Take 10 mg by mouth daily.   aspirin 325 MG EC tablet Take 1 tablet (325 mg total) by mouth 2 (two) times daily for 19 days. Take one tablet (325 mg) Aspirin two times a day for three weeks following surgery.Then take one baby Aspirin (81 mg) once a day for three weeks.Then discontinue aspirin.   atorvastatin 20 MG tablet Commonly known as: LIPITOR Take 20 mg by mouth daily.   ceFAZolin  IVPB Commonly known as: ANCEF Inject 2 g into the vein every 8 (eight) hours. Indication: R knee PJI Last Day of Therapy:  10/03/2019 Labs - Once weekly:  CBC/D and BMP, Labs - Every other week:  ESR and CRP   dorzolamide-timolol 22.3-6.8 MG/ML ophthalmic solution Commonly known as: COSOPT Place 1 drop into both eyes 2 (two) times daily.   latanoprost 0.005 %  ophthalmic solution Commonly known as: XALATAN Place 1 drop into both eyes every morning.   lisinopril 30 MG tablet Commonly known as: ZESTRIL Take 30 mg by mouth daily.   methocarbamol 500 MG tablet Commonly known as: ROBAXIN Take 1 tablet (500 mg total) by mouth every 6 (six) hours as needed for muscle spasms.   omeprazole 20 MG capsule Commonly known as: PRILOSEC Take 20 mg by mouth daily.   oxyCODONE 5 MG immediate release tablet Commonly known as: Oxy IR/ROXICODONE Take 1-2 tablets (5-10 mg total) by mouth every 6 (six) hours as needed for severe pain.   polyethylene glycol 17 g packet Commonly known as: MIRALAX / GLYCOLAX Take 17 g by mouth daily as needed for moderate constipation.   polyvinyl alcohol 1.4 % ophthalmic solution Commonly known as: LIQUIFILM TEARS Place 1 drop into both eyes as needed for dry eyes.   traMADol 50 MG tablet Commonly known as: ULTRAM Take 1-2 tablets (50-100 mg total) by mouth every 6 (six) hours as needed for moderate pain.            Home Infusion Instuctions  (From admission, onward)         Start     Ordered   08/24/19 0000  Home infusion instructions [facility] May follow [facility] Dosing Protocol; May administer Cathflo as needed to maintain patency of vascular access device.; Flushing of vascular access device: per [facility] Protocol: 0.9% NaCl pre/post medica...    Question Answer Comment  Instructions May follow [facility] Dosing Protocol   Instructions May administer Cathflo as needed to maintain patency of vascular access device.   Instructions Flushing of vascular access device: per [facility] Protocol: 0.9% NaCl pre/post medication administration and prn patency; Heparin 100 u/ml, 33m for implanted ports and Heparin 10u/ml, 536mfor all other central venous catheters.   Instructions May follow [facility]  Anaphylaxis Protocol for First Dose Administration in the home: 0.9% NaCl at 25-50 ml/hr to maintain IV access for protocol  meds. Epinephrine 0.3 ml IV/IM PRN and Benadryl 25-50 IV/IM PRN s/s of anaphylaxis.   Instructions [facility] Infusion Coordinator (RN) to assist per patient IV care needs in the home PRN.      08/24/19 0817           Discharge Care Instructions  (From admission, onward)         Start     Ordered   08/24/19 0000  Weight bearing as tolerated     08/24/19 0817   08/24/19 0000  Change dressing    Comments: Change dressing on Thursday, then change the dressing daily with sterile 4 x 4 inch gauze dressing and apply TED hose.   08/24/19 3700         Follow-up Information    Gaynelle Arabian, MD. Schedule an appointment as soon as possible for a visit on 09/06/2019.   Specialty: Orthopedic Surgery Contact information: [address] [place] [address] [phone]           Signed: Griffith Citron, PA-C Orthopedic Surgery 08/25/2019, 8:13 AM

## 2019-08-27 ENCOUNTER — Encounter (HOSPITAL_BASED_OUTPATIENT_CLINIC_OR_DEPARTMENT_OTHER): Payer: Self-pay

## 2019-08-27 ENCOUNTER — Other Ambulatory Visit: Payer: Self-pay

## 2019-08-27 ENCOUNTER — Emergency Department (HOSPITAL_BASED_OUTPATIENT_CLINIC_OR_DEPARTMENT_OTHER)
Admission: EM | Admit: 2019-08-27 | Discharge: 2019-08-27 | Disposition: A | Discharge status: Home / Self Care | Payer: Medicare HMO | Attending: Emergency Medicine | Admitting: Emergency Medicine

## 2019-08-27 DIAGNOSIS — Z87891 Personal history of nicotine dependence: Secondary | ICD-10-CM | POA: Insufficient documentation

## 2019-08-27 DIAGNOSIS — E1122 Type 2 diabetes mellitus with diabetic chronic kidney disease: Secondary | ICD-10-CM | POA: Insufficient documentation

## 2019-08-27 DIAGNOSIS — Y711 Therapeutic (nonsurgical) and rehabilitative cardiovascular devices associated with adverse incidents: Secondary | ICD-10-CM | POA: Insufficient documentation

## 2019-08-27 DIAGNOSIS — N183 Chronic kidney disease, stage 3 unspecified: Secondary | ICD-10-CM | POA: Diagnosis not present

## 2019-08-27 DIAGNOSIS — I129 Hypertensive chronic kidney disease with stage 1 through stage 4 chronic kidney disease, or unspecified chronic kidney disease: Secondary | ICD-10-CM | POA: Diagnosis not present

## 2019-08-27 DIAGNOSIS — T829XXA Unspecified complication of cardiac and vascular prosthetic device, implant and graft, initial encounter: Secondary | ICD-10-CM | POA: Diagnosis not present

## 2019-08-27 MED ORDER — ALTEPLASE 2 MG IJ SOLR
2.0000 mg | Freq: Once | INTRAMUSCULAR | Status: DC
  Filled 2019-08-27: qty 2

## 2019-08-27 MED ORDER — HEPARIN SOD (PORK) LOCK FLUSH 100 UNIT/ML IV SOLN
INTRAVENOUS | Status: AC
  Administered 2019-08-27: 19:00:00 500 [IU]
  Filled 2019-08-27: qty 5

## 2019-08-27 NOTE — Discharge Instructions (Signed)
We were able to get your PICC line working here.  Please use as directed.  Return to the emergency department any new or suddenly worsening symptoms.

## 2019-08-27 NOTE — ED Triage Notes (Addendum)
Pt has a PICC line in RUE that has clotted off. Pt needs it flushed so he can continue to receive abx therapy at home for a knee infection. Pt told to come to the ED by his home health nurse.

## 2019-08-27 NOTE — ED Provider Notes (Signed)
Emergency Department Provider Note   I have reviewed the triage vital signs and the nursing notes.   HISTORY  Chief Complaint Vascular Access Problem   HPI Drew Hudson is a 83 y.o. male with past medical history reviewed below returns to the emergency department with PICC line complication.  Patient was discharged on 10/21 with right PICC line.  He has been undergoing home infusions of Ancef. He reports that a family member was instructed on how to flush the catheter prior to discharge.  The patient states that the family member accidentally removed the cath and there was some bleeding for approximately 30 minutes.  They replaced the but left the blood in the line causing clotting.  The home health nurse was unable to flush the line with saline.  They were referred to the emergency department for management.  Patient denies any pain in the area, redness, fevers.  Past Medical History:  Diagnosis Date  . Arthritis   . Cataract   . Chronic kidney disease    Stage 3 per pt  . Diabetes mellitus without complication (HCC)    diet controlled  . Dizziness   . Dry eyes   . Edema    Bilateral legs  . Fecal incontinence   . Generalized weakness   . GERD (gastroesophageal reflux disease)   . Glaucoma   . Headache   . History of chest pain    Saw cardiologist, no blockages noted, possible gas pains  . Hyperlipidemia   . Hypertension   . Idiopathic peripheral neuropathy   . Insomnia   . OSA on CPAP   . Prostate cancer ([place])   . Urinary urgency   . Vitamin D deficiency   . Wears glasses   . Wears hearing aid in both ears   . Wears partial dentures     Patient Active Problem List   Diagnosis Date Noted  . Septic arthritis of knee, right ([place]) 08/22/2019  . OA (osteoarthritis) of knee 10/18/2018    Past Surgical History:  Procedure Laterality Date  . ANAL RECTAL MANOMETRY  10/08/2018  . ANKLE SURGERY    . BIOPSY THYROID Left 04/08/2015  . CARDIAC CATHETERIZATION     . COLONOSCOPY    . CYSTOSCOPY    . EPIDIDYMIS SURGERY    . EXCISIONAL TOTAL KNEE ARTHROPLASTY WITH ANTIBIOTIC SPACERS Right 08/22/2019   Procedure: Right knee resection arthroplasty; antibiotic spacer;  Surgeon: Gaynelle Arabian, MD;  Location: [facility];  Service: Orthopedics;  Laterality: Right;  69min  . HEMORRHOID SURGERY    . PENILE PROSTHESIS IMPLANT    . PROSTATECTOMY    . TOTAL KNEE ARTHROPLASTY Right 10/18/2018   Procedure: RIGHT TOTAL KNEE ARTHROPLASTY;  Surgeon: Gaynelle Arabian, MD;  Location: [facility];  Service: Orthopedics;  Laterality: Right;  30min    Allergies Lovastatin and Penicillins  No family history on file.  Social History Social History   Tobacco Use  . Smoking status: Former Research scientist (life sciences)  . Smokeless tobacco: Never Used  Substance Use Topics  . Alcohol use: Never    Frequency: Never  . Drug use: Never    Review of Systems  Constitutional: No fever/chills Cardiovascular: Denies chest pain. PICC line complication.  Skin: Negative for rash. Neurological: Negative for numbnss.e  10-point ROS otherwise negative.  ____________________________________________   PHYSICAL EXAM:  VITAL SIGNS: ED Triage Vitals  Enc Vitals Group     BP 08/27/19 1709 (!) 165/90     Pulse Rate 08/27/19 1709 85  Resp 08/27/19 1709 20     Temp 08/27/19 1709 98.9 F (37.2 C)     Temp Source 08/27/19 1709 Oral     SpO2 08/27/19 1709 98 %     Weight 08/27/19 1710 213 lb (96.6 kg)     Height 08/27/19 1710 5' 11.5" (1.816 m)   Constitutional: Alert and oriented. Well appearing and in no acute distress. Eyes: Conjunctivae are normal. Head: Atraumatic. Nose: No congestion/rhinnorhea. Mouth/Throat: Mucous membranes are moist.  Neck: No stridor. Cardiovascular: Normal rate, regular rhythm.  Respiratory: Normal respiratory effort.  Gastrointestinal: No distention.  Musculoskeletal: No gross deformities of extremities. Neurologic:  Normal speech and language.  Skin:  Skin is  warm, dry and intact. No rash noted. Well appearing PICC line in the RUE.   ____________________________________________  RADIOLOGY  None ____________________________________________   PROCEDURES  Procedure(s) performed:   Procedures  None ____________________________________________   INITIAL IMPRESSION / ASSESSMENT AND PLAN / ED COURSE  Pertinent labs & imaging results that were available during my care of the patient were reviewed by me and considered in my medical decision making (see chart for details).   Patient with PICC line clotted and not flushing.  Alteplase will be attempted here.   Prior to alteplase, the nurse pulled back and flushed the line. Line flushes without difficulty. Hep-locked at discharge.  ____________________________________________  FINAL CLINICAL IMPRESSION(S) / ED DIAGNOSES  Final diagnoses:  Complication associated with peripherally inserted central catheter (PICC), initial encounter     MEDICATIONS GIVEN DURING THIS VISIT:  Medications  heparin lock flush 100 UNIT/ML injection (500 Units  Given 08/27/19 1833)     Note:  This document was prepared using Dragon voice recognition software and may include unintentional dictation errors.  Nanda Quinton, MD, [facility] Emergency Medicine    , [facility], MD 08/28/19 [phone]

## 2019-08-30 ENCOUNTER — Other Ambulatory Visit (HOSPITAL_COMMUNITY)
Admission: RE | Admit: 2019-08-30 | Discharge: 2019-08-30 | Disposition: A | Discharge status: Home / Self Care | Payer: Medicare HMO | Source: Other Acute Inpatient Hospital | Attending: Infectious Diseases | Admitting: Infectious Diseases

## 2019-08-30 DIAGNOSIS — T8453XA Infection and inflammatory reaction due to internal right knee prosthesis, initial encounter: Secondary | ICD-10-CM | POA: Diagnosis present

## 2019-08-30 LAB — CBC WITH DIFFERENTIAL/PLATELET
Abs Immature Granulocytes: 0.02 10*3/uL (ref 0.00–0.07)
Basophils Absolute: 0 10*3/uL (ref 0.0–0.1)
Basophils Relative: 1 %
Eosinophils Absolute: 0.3 10*3/uL (ref 0.0–0.5)
Eosinophils Relative: 6 %
HCT: 34.1 % — ABNORMAL LOW (ref 39.0–52.0)
Hemoglobin: 10.8 g/dL — ABNORMAL LOW (ref 13.0–17.0)
Immature Granulocytes: 0 %
Lymphocytes Relative: 24 %
Lymphs Abs: 1.4 10*3/uL (ref 0.7–4.0)
MCH: 26.5 pg (ref 26.0–34.0)
MCHC: 31.7 g/dL (ref 30.0–36.0)
MCV: 83.8 fL (ref 80.0–100.0)
Monocytes Absolute: 0.8 10*3/uL (ref 0.1–1.0)
Monocytes Relative: 14 %
Neutro Abs: 3.2 10*3/uL (ref 1.7–7.7)
Neutrophils Relative %: 55 %
Platelets: 267 10*3/uL (ref 150–400)
RBC: 4.07 MIL/uL — ABNORMAL LOW (ref 4.22–5.81)
RDW: 13.5 % (ref 11.5–15.5)
WBC: 5.8 10*3/uL (ref 4.0–10.5)
nRBC: 0 % (ref 0.0–0.2)

## 2019-08-30 LAB — COMPREHENSIVE METABOLIC PANEL
ALT: 6 U/L (ref 0–44)
AST: 17 U/L (ref 15–41)
Albumin: 2.4 g/dL — ABNORMAL LOW (ref 3.5–5.0)
Alkaline Phosphatase: 71 U/L (ref 38–126)
Anion gap: 8 (ref 5–15)
BUN: 18 mg/dL (ref 8–23)
CO2: 23 mmol/L (ref 22–32)
Calcium: 8.5 mg/dL — ABNORMAL LOW (ref 8.9–10.3)
Chloride: 107 mmol/L (ref 98–111)
Creatinine, Ser: 1.44 mg/dL — ABNORMAL HIGH (ref 0.61–1.24)
GFR calc Af Amer: 52 mL/min — ABNORMAL LOW (ref >60)
GFR calc non Af Amer: 45 mL/min — ABNORMAL LOW (ref >60)
Glucose, Bld: 99 mg/dL (ref 70–99)
Potassium: 4.2 mmol/L (ref 3.5–5.1)
Sodium: 138 mmol/L (ref 135–145)
Total Bilirubin: 0.3 mg/dL (ref 0.3–1.2)
Total Protein: 5.7 g/dL — ABNORMAL LOW (ref 6.5–8.1)

## 2019-08-30 LAB — SEDIMENTATION RATE: Sed Rate: 38 mm/hr — ABNORMAL HIGH (ref 0–16)

## 2019-08-30 LAB — C-REACTIVE PROTEIN: CRP: 5.3 mg/dL — ABNORMAL HIGH (ref <1.0)

## 2019-09-06 ENCOUNTER — Other Ambulatory Visit (HOSPITAL_COMMUNITY)
Admission: RE | Admit: 2019-09-06 | Discharge: 2019-09-06 | Disposition: A | Discharge status: Home / Self Care | Payer: Medicare HMO | Source: Other Acute Inpatient Hospital | Attending: Infectious Diseases | Admitting: Infectious Diseases

## 2019-09-06 DIAGNOSIS — T8453XA Infection and inflammatory reaction due to internal right knee prosthesis, initial encounter: Secondary | ICD-10-CM | POA: Diagnosis present

## 2019-09-06 LAB — COMPREHENSIVE METABOLIC PANEL
ALT: 5 U/L (ref 0–44)
AST: 22 U/L (ref 15–41)
Albumin: 2.8 g/dL — ABNORMAL LOW (ref 3.5–5.0)
Alkaline Phosphatase: 88 U/L (ref 38–126)
Anion gap: 7 (ref 5–15)
BUN: 23 mg/dL (ref 8–23)
CO2: 23 mmol/L (ref 22–32)
Calcium: 8.7 mg/dL — ABNORMAL LOW (ref 8.9–10.3)
Chloride: 110 mmol/L (ref 98–111)
Creatinine, Ser: 1.4 mg/dL — ABNORMAL HIGH (ref 0.61–1.24)
GFR calc Af Amer: 54 mL/min — ABNORMAL LOW (ref >60)
GFR calc non Af Amer: 47 mL/min — ABNORMAL LOW (ref >60)
Glucose, Bld: 96 mg/dL (ref 70–99)
Potassium: 4.2 mmol/L (ref 3.5–5.1)
Sodium: 140 mmol/L (ref 135–145)
Total Bilirubin: 0.5 mg/dL (ref 0.3–1.2)
Total Protein: 6.2 g/dL — ABNORMAL LOW (ref 6.5–8.1)

## 2019-09-06 LAB — SEDIMENTATION RATE: Sed Rate: 28 mm/hr — ABNORMAL HIGH (ref 0–16)

## 2019-09-06 LAB — CBC WITH DIFFERENTIAL/PLATELET
Abs Immature Granulocytes: 0.01 10*3/uL (ref 0.00–0.07)
Basophils Absolute: 0.1 10*3/uL (ref 0.0–0.1)
Basophils Relative: 1 %
Eosinophils Absolute: 0.2 10*3/uL (ref 0.0–0.5)
Eosinophils Relative: 4 %
HCT: 36.4 % — ABNORMAL LOW (ref 39.0–52.0)
Hemoglobin: 11.4 g/dL — ABNORMAL LOW (ref 13.0–17.0)
Immature Granulocytes: 0 %
Lymphocytes Relative: 27 %
Lymphs Abs: 1.4 10*3/uL (ref 0.7–4.0)
MCH: 26.8 pg (ref 26.0–34.0)
MCHC: 31.3 g/dL (ref 30.0–36.0)
MCV: 85.4 fL (ref 80.0–100.0)
Monocytes Absolute: 0.7 10*3/uL (ref 0.1–1.0)
Monocytes Relative: 13 %
Neutro Abs: 2.8 10*3/uL (ref 1.7–7.7)
Neutrophils Relative %: 55 %
Platelets: 184 10*3/uL (ref 150–400)
RBC: 4.26 MIL/uL (ref 4.22–5.81)
RDW: 14.6 % (ref 11.5–15.5)
WBC: 5.2 10*3/uL (ref 4.0–10.5)
nRBC: 0 % (ref 0.0–0.2)

## 2019-09-06 LAB — C-REACTIVE PROTEIN: CRP: 3.6 mg/dL — ABNORMAL HIGH (ref <1.0)

## 2019-09-12 ENCOUNTER — Encounter (HOSPITAL_BASED_OUTPATIENT_CLINIC_OR_DEPARTMENT_OTHER): Payer: Self-pay | Admitting: *Deleted

## 2019-09-12 ENCOUNTER — Emergency Department (HOSPITAL_BASED_OUTPATIENT_CLINIC_OR_DEPARTMENT_OTHER)
Admission: EM | Admit: 2019-09-12 | Discharge: 2019-09-12 | Disposition: A | Discharge status: Home / Self Care | Payer: Medicare HMO | Attending: Emergency Medicine | Admitting: Emergency Medicine

## 2019-09-12 ENCOUNTER — Other Ambulatory Visit: Payer: Self-pay

## 2019-09-12 ENCOUNTER — Emergency Department (HOSPITAL_BASED_OUTPATIENT_CLINIC_OR_DEPARTMENT_OTHER): Payer: Medicare HMO

## 2019-09-12 DIAGNOSIS — E1122 Type 2 diabetes mellitus with diabetic chronic kidney disease: Secondary | ICD-10-CM | POA: Insufficient documentation

## 2019-09-12 DIAGNOSIS — Z7982 Long term (current) use of aspirin: Secondary | ICD-10-CM | POA: Diagnosis not present

## 2019-09-12 DIAGNOSIS — I82621 Acute embolism and thrombosis of deep veins of right upper extremity: Secondary | ICD-10-CM | POA: Insufficient documentation

## 2019-09-12 DIAGNOSIS — M79601 Pain in right arm: Secondary | ICD-10-CM | POA: Diagnosis present

## 2019-09-12 DIAGNOSIS — N183 Chronic kidney disease, stage 3 unspecified: Secondary | ICD-10-CM | POA: Insufficient documentation

## 2019-09-12 DIAGNOSIS — Z79899 Other long term (current) drug therapy: Secondary | ICD-10-CM | POA: Diagnosis not present

## 2019-09-12 DIAGNOSIS — Z87891 Personal history of nicotine dependence: Secondary | ICD-10-CM | POA: Diagnosis not present

## 2019-09-12 DIAGNOSIS — I129 Hypertensive chronic kidney disease with stage 1 through stage 4 chronic kidney disease, or unspecified chronic kidney disease: Secondary | ICD-10-CM | POA: Insufficient documentation

## 2019-09-12 MED ORDER — RIVAROXABAN (XARELTO) VTE STARTER PACK (15 & 20 MG): ORAL_TABLET | ORAL | 0 refills | Status: DC

## 2019-09-12 MED ORDER — RIVAROXABAN 15 MG PO TABS
15.0000 mg | ORAL_TABLET | Freq: Once | ORAL | Status: AC
  Administered 2019-09-12: 15 mg via ORAL
  Filled 2019-09-12: qty 1

## 2019-09-12 NOTE — Discharge Instructions (Signed)
Start taking the rivaroxaban as prescribed.  Follow-up with your primary care doctor to continue to manage the DVT.  Stop taking your aspirin while taking the Xarelto medication.

## 2019-09-12 NOTE — ED Provider Notes (Signed)
[facility] Provider Note   CSN: [id number] Arrival date & time: 09/12/19  1528     History   Chief Complaint Chief Complaint  Patient presents with  . Arm Pain    HPI Drew Hudson is a 83 y.o. male.     HPI Patient is receiving IV antibiotics for knee infection.  Patient had a PICC line placed 1 week ago.  Patient states he did notice the last time they put in the PICC line catheter they did have more difficulty than the first time he had one.  He has not had any issues with infusing antibiotics.  The PICC line seems to be functioning properly.  Patient however noticed some bleeding around the PICC line site this morning.  He also is having more discomfort in his arm.  He denies any fevers or chills.  No shortness of breath. Past Medical History:  Diagnosis Date  . Arthritis   . Cataract   . Chronic kidney disease    Stage 3 per pt  . Diabetes mellitus without complication (HCC)    diet controlled  . Dizziness   . Dry eyes   . Edema    Bilateral legs  . Fecal incontinence   . Generalized weakness   . GERD (gastroesophageal reflux disease)   . Glaucoma   . Headache   . History of chest pain    Saw cardiologist, no blockages noted, possible gas pains  . Hyperlipidemia   . Hypertension   . Idiopathic peripheral neuropathy   . Insomnia   . OSA on CPAP   . Prostate cancer ([place])   . Urinary urgency   . Vitamin D deficiency   . Wears glasses   . Wears hearing aid in both ears   . Wears partial dentures     Patient Active Problem List   Diagnosis Date Noted  . Septic arthritis of knee, right ([place]) 08/22/2019  . OA (osteoarthritis) of knee 10/18/2018    Past Surgical History:  Procedure Laterality Date  . ANAL RECTAL MANOMETRY  10/08/2018  . ANKLE SURGERY    . BIOPSY THYROID Left 04/08/2015  . CARDIAC CATHETERIZATION    . COLONOSCOPY    . CYSTOSCOPY    . EPIDIDYMIS SURGERY    . EXCISIONAL TOTAL KNEE ARTHROPLASTY WITH  ANTIBIOTIC SPACERS Right 08/22/2019   Procedure: Right knee resection arthroplasty; antibiotic spacer;  Surgeon: Gaynelle Arabian, MD;  Location: [facility];  Service: Orthopedics;  Laterality: Right;  13mn  . HEMORRHOID SURGERY    . PENILE PROSTHESIS IMPLANT    . PROSTATECTOMY    . TOTAL KNEE ARTHROPLASTY Right 10/18/2018   Procedure: RIGHT TOTAL KNEE ARTHROPLASTY;  Surgeon: AGaynelle Arabian MD;  Location: [facility];  Service: Orthopedics;  Laterality: Right;  566m        Home Medications    Prior to Admission medications   Medication Sig Start Date End Date Taking? Authorizing Provider  amLODipine (NORVASC) 10 MG tablet Take 10 mg by mouth daily. 10/04/18   [provider]  aspirin EC 325 MG EC tablet Take 1 tablet (325 mg total) by mouth 2 (two) times daily for 19 days. Take one tablet (325 mg) Aspirin two times a day for three weeks following surgery.Then take one baby Aspirin (81 mg) once a day for three weeks.Then discontinue aspirin. 08/24/19 09/12/19  StMaurice MarchPA-C  atorvastatin (LIPITOR) 20 MG tablet Take 20 mg by mouth daily. 07/14/18   [provider]  ceFAZolin (  ANCEF) IVPB Inject 2 g into the vein every 8 (eight) hours. Indication: R knee PJI Last Day of Therapy:  10/03/2019 Labs - Once weekly:  CBC/D and BMP, Labs - Every other week:  ESR and CRP 08/24/19 10/03/19  Maurice March, PA-C  dorzolamide-timolol (COSOPT) 22.3-6.8 MG/ML ophthalmic solution Place 1 drop into both eyes 2 (two) times daily. 09/28/18   [provider]  latanoprost (XALATAN) 0.005 % ophthalmic solution Place 1 drop into both eyes every morning. 10/05/18   [provider]  lisinopril (ZESTRIL) 30 MG tablet Take 30 mg by mouth daily.    [provider]  methocarbamol (ROBAXIN) 500 MG tablet Take 1 tablet (500 mg total) by mouth every 6 (six) hours as needed for muscle spasms. 08/24/19   Maurice March, PA-C  omeprazole (PRILOSEC) 20 MG capsule Take 20 mg by  mouth daily. 10/05/18   [provider]  oxyCODONE (OXY IR/ROXICODONE) 5 MG immediate release tablet Take 1-2 tablets (5-10 mg total) by mouth every 6 (six) hours as needed for severe pain. 08/24/19   Maurice March, PA-C  polyethylene glycol (MIRALAX / GLYCOLAX) packet Take 17 g by mouth daily as needed for moderate constipation.    [provider]  polyvinyl alcohol (LIQUIFILM TEARS) 1.4 % ophthalmic solution Place 1 drop into both eyes as needed for dry eyes.    [provider]  Rivaroxaban 15 & 20 MG TBPK Follow package directions: Take one '15mg'$  tablet by mouth twice a day. On day 22, switch to one '20mg'$  tablet once a day. Take with food. 09/12/19   Dorie Rank, MD  traMADol (ULTRAM) 50 MG tablet Take 1-2 tablets (50-100 mg total) by mouth every 6 (six) hours as needed for moderate pain. 08/24/19   Maurice March, PA-C    Family History No family history on file.  Social History Social History   Tobacco Use  . Smoking status: Former Research scientist (life sciences)  . Smokeless tobacco: Never Used  Substance Use Topics  . Alcohol use: Never    Frequency: Never  . Drug use: Never     Allergies   Lovastatin and Penicillins   Review of Systems Review of Systems  All other systems reviewed and are negative.    Physical Exam Updated Vital Signs BP (!) 145/79   Pulse 73   Temp 98.7 F (37.1 C) (Oral)   Resp 16   Ht 1.816 m (5' 11.5")   Wt 92.1 kg   SpO2 97%   BMI 27.92 kg/m   Physical Exam Vitals signs and nursing note reviewed.  Constitutional:      General: He is not in acute distress.    Appearance: He is well-developed.  HENT:     Head: Normocephalic and atraumatic.     Right Ear: External ear normal.     Left Ear: External ear normal.  Eyes:     General: No scleral icterus.       Right eye: No discharge.        Left eye: No discharge.     Conjunctiva/sclera: Conjunctivae normal.  Neck:     Musculoskeletal: Neck supple.     Trachea: No tracheal  deviation.  Cardiovascular:     Rate and Rhythm: Normal rate and regular rhythm.  Pulmonary:     Effort: Pulmonary effort is normal. No respiratory distress.     Breath sounds: Normal breath sounds. No stridor.  Abdominal:     General: There is no distension.  Musculoskeletal:  General: No swelling or deformity.     Comments: Clot in the right antecubital fossa, no erythema, small amount of blood at the PICC line insertion site underneath the dressing, no edema noted in the forearm, no erythema  Skin:    General: Skin is warm and dry.     Findings: No rash.  Neurological:     Mental Status: He is alert.     Cranial Nerves: Cranial nerve deficit: no gross deficits.      ED Treatments / Results  Labs (all labs ordered are listed, but only abnormal results are displayed) Labs Reviewed - No data to display  EKG None  Radiology US Venous Img Upper Right (dvt Study)  Result Date: 09/12/2019 CLINICAL DATA:  Arm pain and swelling.  Recent PICC line placement. EXAM: RIGHT UPPER EXTREMITY VENOUS DOPPLER ULTRASOUND TECHNIQUE: Gray-scale sonography with graded compression, as well as color Doppler and duplex ultrasound were performed to evaluate the upper extremity deep venous system from the level of the subclavian vein and including the jugular, axillary, basilic, radial, ulnar and upper cephalic vein. Spectral Doppler was utilized to evaluate flow at rest and with distal augmentation maneuvers. COMPARISON:  None. FINDINGS: Contralateral Subclavian Vein:  Normal Internal Jugular Vein: No evidence of thrombus. Normal compressibility, respiratory phasicity and response to augmentation. Subclavian Vein: There is extensive occlusive thrombus throughout the right subclavian vein. A PICC line is noted. Axillary Vein: Extensive occlusive thrombus is noted. Cephalic Vein: No evidence of thrombus. Normal compressibility, respiratory phasicity and response to augmentation. Basilic Vein: No evidence  of thrombus. Normal compressibility, respiratory phasicity and response to augmentation. Brachial Veins: The brachial vein demonstrates complete thrombosis that is occlusive. Radial Veins: There is occlusive thrombosis throughout the radial vein. Ulnar Veins: No evidence of thrombus. Normal compressibility, respiratory phasicity and response to augmentation. Venous Reflux:  Not evaluated. Other Findings:  None visualized. IMPRESSION: Extensive occlusive thrombosis of the right upper extremity from the radial vein extending into the brachial, axillary, and subclavian veins. Electronically Signed   By: Constance Holster M.D.   On: 09/12/2019 18:46    Procedures Procedures (including critical care time)  Medications Ordered in ED Medications  Rivaroxaban (XARELTO) tablet 15 mg (has no administration in time range)     Initial Impression / Assessment and Plan / ED Course  I have reviewed the triage vital signs and the nursing notes.  Pertinent labs & imaging results that were available during my care of the patient were reviewed by me and considered in my medical decision making (see chart for details).  Clinical Course as of Sep 12 1951  Molli Knock Sep 12, 2019  1856 Labs reviewed from November 3.  GFR 54   [JK]  1911 D/w Dr Ammie Dalton.  Maintain picc line, start on anticoagulation, lovenox, xarelto etc   [JK]    Clinical Course User Index [JK] Dorie Rank, MD     Patient has a catheter associated venous thrombosis.  No other systemic symptoms to suggest pulmonary embolism or vascular complications.  I will start the patient on Xarelto.  I will have him hold his aspirin.  Continue the catheter.  Follow-up with his primary care doctor.  Final Clinical Impressions(s) / ED Diagnoses   Final diagnoses:  Acute deep vein thrombosis (DVT) of right upper extremity, unspecified vein [facility])    ED Discharge Orders         Ordered    Rivaroxaban 15 & 20 MG TBPK     09/12/19 1945  Dorie Rank, MD 09/12/19 [phone]

## 2019-09-12 NOTE — ED Notes (Signed)
Patient transported to Ultrasound 

## 2019-09-12 NOTE — ED Triage Notes (Signed)
Had picc line placed in rt upper arm 1 week ago  Woke this am w some pain later this am noticed some blood at insertion site

## 2019-09-13 ENCOUNTER — Other Ambulatory Visit (HOSPITAL_COMMUNITY)
Admission: RE | Admit: 2019-09-13 | Discharge: 2019-09-13 | Disposition: A | Discharge status: Home / Self Care | Payer: Medicare HMO | Source: Other Acute Inpatient Hospital | Attending: Infectious Diseases | Admitting: Infectious Diseases

## 2019-09-13 DIAGNOSIS — T8453XA Infection and inflammatory reaction due to internal right knee prosthesis, initial encounter: Secondary | ICD-10-CM | POA: Diagnosis present

## 2019-09-13 LAB — COMPREHENSIVE METABOLIC PANEL
ALT: 7 U/L (ref 0–44)
AST: 21 U/L (ref 15–41)
Albumin: 2.9 g/dL — ABNORMAL LOW (ref 3.5–5.0)
Alkaline Phosphatase: 86 U/L (ref 38–126)
Anion gap: 8 (ref 5–15)
BUN: 22 mg/dL (ref 8–23)
CO2: 23 mmol/L (ref 22–32)
Calcium: 8.5 mg/dL — ABNORMAL LOW (ref 8.9–10.3)
Chloride: 108 mmol/L (ref 98–111)
Creatinine, Ser: 1.7 mg/dL — ABNORMAL HIGH (ref 0.61–1.24)
GFR calc Af Amer: 43 mL/min — ABNORMAL LOW (ref >60)
GFR calc non Af Amer: 37 mL/min — ABNORMAL LOW (ref >60)
Glucose, Bld: 113 mg/dL — ABNORMAL HIGH (ref 70–99)
Potassium: 4.2 mmol/L (ref 3.5–5.1)
Sodium: 139 mmol/L (ref 135–145)
Total Bilirubin: 0.7 mg/dL (ref 0.3–1.2)
Total Protein: 6.3 g/dL — ABNORMAL LOW (ref 6.5–8.1)

## 2019-09-13 LAB — CBC WITH DIFFERENTIAL/PLATELET
Abs Immature Granulocytes: 0.01 10*3/uL (ref 0.00–0.07)
Basophils Absolute: 0 10*3/uL (ref 0.0–0.1)
Basophils Relative: 1 %
Eosinophils Absolute: 0.3 10*3/uL (ref 0.0–0.5)
Eosinophils Relative: 6 %
HCT: 33.3 % — ABNORMAL LOW (ref 39.0–52.0)
Hemoglobin: 10.4 g/dL — ABNORMAL LOW (ref 13.0–17.0)
Immature Granulocytes: 0 %
Lymphocytes Relative: 24 %
Lymphs Abs: 1.1 10*3/uL (ref 0.7–4.0)
MCH: 26.6 pg (ref 26.0–34.0)
MCHC: 31.2 g/dL (ref 30.0–36.0)
MCV: 85.2 fL (ref 80.0–100.0)
Monocytes Absolute: 0.7 10*3/uL (ref 0.1–1.0)
Monocytes Relative: 15 %
Neutro Abs: 2.6 10*3/uL (ref 1.7–7.7)
Neutrophils Relative %: 54 %
Platelets: 172 10*3/uL (ref 150–400)
RBC: 3.91 MIL/uL — ABNORMAL LOW (ref 4.22–5.81)
RDW: 14.6 % (ref 11.5–15.5)
WBC: 4.7 10*3/uL (ref 4.0–10.5)
nRBC: 0 % (ref 0.0–0.2)

## 2019-09-13 LAB — SEDIMENTATION RATE: Sed Rate: 22 mm/hr — ABNORMAL HIGH (ref 0–16)

## 2019-09-13 LAB — C-REACTIVE PROTEIN: CRP: 4.4 mg/dL — ABNORMAL HIGH (ref <1.0)

## 2019-09-20 ENCOUNTER — Other Ambulatory Visit (HOSPITAL_COMMUNITY)
Admission: RE | Admit: 2019-09-20 | Discharge: 2019-09-20 | Disposition: A | Discharge status: Home / Self Care | Payer: Medicare HMO | Source: Other Acute Inpatient Hospital | Attending: Infectious Diseases | Admitting: Infectious Diseases

## 2019-09-20 DIAGNOSIS — T8453XA Infection and inflammatory reaction due to internal right knee prosthesis, initial encounter: Secondary | ICD-10-CM | POA: Insufficient documentation

## 2019-09-20 LAB — CBC WITH DIFFERENTIAL/PLATELET
Abs Immature Granulocytes: 0.02 10*3/uL (ref 0.00–0.07)
Basophils Absolute: 0 10*3/uL (ref 0.0–0.1)
Basophils Relative: 0 %
Eosinophils Absolute: 0.3 10*3/uL (ref 0.0–0.5)
Eosinophils Relative: 6 %
HCT: 34.4 % — ABNORMAL LOW (ref 39.0–52.0)
Hemoglobin: 10.9 g/dL — ABNORMAL LOW (ref 13.0–17.0)
Immature Granulocytes: 0 %
Lymphocytes Relative: 26 %
Lymphs Abs: 1.2 10*3/uL (ref 0.7–4.0)
MCH: 26.6 pg (ref 26.0–34.0)
MCHC: 31.7 g/dL (ref 30.0–36.0)
MCV: 83.9 fL (ref 80.0–100.0)
Monocytes Absolute: 0.7 10*3/uL (ref 0.1–1.0)
Monocytes Relative: 15 %
Neutro Abs: 2.4 10*3/uL (ref 1.7–7.7)
Neutrophils Relative %: 53 %
Platelets: 230 10*3/uL (ref 150–400)
RBC: 4.1 MIL/uL — ABNORMAL LOW (ref 4.22–5.81)
RDW: 14.4 % (ref 11.5–15.5)
WBC: 4.7 10*3/uL (ref 4.0–10.5)
nRBC: 0 % (ref 0.0–0.2)

## 2019-09-20 LAB — COMPREHENSIVE METABOLIC PANEL
ALT: 6 U/L (ref 0–44)
AST: 23 U/L (ref 15–41)
Albumin: 2.9 g/dL — ABNORMAL LOW (ref 3.5–5.0)
Alkaline Phosphatase: 88 U/L (ref 38–126)
Anion gap: 9 (ref 5–15)
BUN: 18 mg/dL (ref 8–23)
CO2: 22 mmol/L (ref 22–32)
Calcium: 8.9 mg/dL (ref 8.9–10.3)
Chloride: 107 mmol/L (ref 98–111)
Creatinine, Ser: 1.44 mg/dL — ABNORMAL HIGH (ref 0.61–1.24)
GFR calc Af Amer: 52 mL/min — ABNORMAL LOW (ref >60)
GFR calc non Af Amer: 45 mL/min — ABNORMAL LOW (ref >60)
Glucose, Bld: 90 mg/dL (ref 70–99)
Potassium: 4.4 mmol/L (ref 3.5–5.1)
Sodium: 138 mmol/L (ref 135–145)
Total Bilirubin: 0.4 mg/dL (ref 0.3–1.2)
Total Protein: 6.5 g/dL (ref 6.5–8.1)

## 2019-09-20 LAB — C-REACTIVE PROTEIN: CRP: 2.6 mg/dL — ABNORMAL HIGH (ref <1.0)

## 2019-09-20 LAB — SEDIMENTATION RATE: Sed Rate: 24 mm/hr — ABNORMAL HIGH (ref 0–16)

## 2019-09-27 ENCOUNTER — Other Ambulatory Visit (HOSPITAL_COMMUNITY)
Admission: RE | Admit: 2019-09-27 | Discharge: 2019-09-27 | Disposition: A | Discharge status: Home / Self Care | Payer: Medicare HMO | Source: Other Acute Inpatient Hospital | Attending: Infectious Diseases | Admitting: Infectious Diseases

## 2019-09-27 DIAGNOSIS — T8453XA Infection and inflammatory reaction due to internal right knee prosthesis, initial encounter: Secondary | ICD-10-CM | POA: Insufficient documentation

## 2019-09-27 LAB — COMPREHENSIVE METABOLIC PANEL
ALT: 6 U/L (ref 0–44)
AST: 23 U/L (ref 15–41)
Albumin: 2.7 g/dL — ABNORMAL LOW (ref 3.5–5.0)
Alkaline Phosphatase: 73 U/L (ref 38–126)
Anion gap: 7 (ref 5–15)
BUN: 16 mg/dL (ref 8–23)
CO2: 23 mmol/L (ref 22–32)
Calcium: 8.6 mg/dL — ABNORMAL LOW (ref 8.9–10.3)
Chloride: 111 mmol/L (ref 98–111)
Creatinine, Ser: 1.33 mg/dL — ABNORMAL HIGH (ref 0.61–1.24)
GFR calc Af Amer: 58 mL/min — ABNORMAL LOW (ref >60)
GFR calc non Af Amer: 50 mL/min — ABNORMAL LOW (ref >60)
Glucose, Bld: 157 mg/dL — ABNORMAL HIGH (ref 70–99)
Potassium: 4.1 mmol/L (ref 3.5–5.1)
Sodium: 141 mmol/L (ref 135–145)
Total Bilirubin: 0.4 mg/dL (ref 0.3–1.2)
Total Protein: 5.8 g/dL — ABNORMAL LOW (ref 6.5–8.1)

## 2019-09-27 LAB — C-REACTIVE PROTEIN: CRP: 0.9 mg/dL (ref <1.0)

## 2019-09-27 LAB — CBC WITH DIFFERENTIAL/PLATELET
Abs Immature Granulocytes: 0.01 10*3/uL (ref 0.00–0.07)
Basophils Absolute: 0 10*3/uL (ref 0.0–0.1)
Basophils Relative: 1 %
Eosinophils Absolute: 0.2 10*3/uL (ref 0.0–0.5)
Eosinophils Relative: 6 %
HCT: 32.3 % — ABNORMAL LOW (ref 39.0–52.0)
Hemoglobin: 9.8 g/dL — ABNORMAL LOW (ref 13.0–17.0)
Immature Granulocytes: 0 %
Lymphocytes Relative: 29 %
Lymphs Abs: 0.9 10*3/uL (ref 0.7–4.0)
MCH: 26.1 pg (ref 26.0–34.0)
MCHC: 30.3 g/dL (ref 30.0–36.0)
MCV: 85.9 fL (ref 80.0–100.0)
Monocytes Absolute: 0.5 10*3/uL (ref 0.1–1.0)
Monocytes Relative: 14 %
Neutro Abs: 1.6 10*3/uL — ABNORMAL LOW (ref 1.7–7.7)
Neutrophils Relative %: 50 %
Platelets: 186 10*3/uL (ref 150–400)
RBC: 3.76 MIL/uL — ABNORMAL LOW (ref 4.22–5.81)
RDW: 14.4 % (ref 11.5–15.5)
WBC: 3.2 10*3/uL — ABNORMAL LOW (ref 4.0–10.5)
nRBC: 0 % (ref 0.0–0.2)

## 2019-09-27 LAB — SEDIMENTATION RATE: Sed Rate: 11 mm/hr (ref 0–16)

## 2019-11-24 NOTE — H&P (Signed)
TOTAL KNEE REVISION ADMISSION H&P  Patient is being admitted for right total knee arthroplasty reimplantation  Subjective:  Chief Complaint: Status post right total knee resection  HPI: Drew Hudson, 84 y.o. 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 He is approximately 12 weeks out from resection. He has completed antibiotic therapy with no signs of remaining infection. Recent aspiration of the knee in the clinic on 11/15/2019 showed no isolated aerobes or significant growth. He presents today for reimplantation of right total knee arthroplasty.  Patient Active Problem List   Diagnosis Date Noted  . Septic arthritis of knee, right ([place]) 08/22/2019  . OA (osteoarthritis) of knee 10/18/2018   Past Medical History:  Diagnosis Date  . Arthritis   . Cataract   . Chronic kidney disease    Stage 3 per pt  . Diabetes mellitus without complication (HCC)    diet controlled  . Dizziness   . Dry eyes   . Edema    Bilateral legs  . Fecal incontinence   . Generalized weakness   . GERD (gastroesophageal reflux disease)   . Glaucoma   . Headache   . History of chest pain    Saw cardiologist, no blockages noted, possible gas pains  . Hyperlipidemia   . Hypertension   . Idiopathic peripheral neuropathy   . Insomnia   . OSA on CPAP   . Prostate cancer ([place])   . Urinary urgency   . Vitamin D deficiency   . Wears glasses   . Wears hearing aid in both ears   . Wears partial dentures     Past Surgical History:  Procedure Laterality Date  . ANAL RECTAL MANOMETRY  10/08/2018  . ANKLE SURGERY    . BIOPSY THYROID Left 04/08/2015  . CARDIAC CATHETERIZATION    . COLONOSCOPY    . CYSTOSCOPY    . EPIDIDYMIS SURGERY    . EXCISIONAL TOTAL KNEE ARTHROPLASTY WITH ANTIBIOTIC SPACERS Right 08/22/2019   Procedure: Right knee resection arthroplasty; antibiotic spacer;  Surgeon: Gaynelle Arabian, MD;  Location: [facility];  Service:  Orthopedics;  Laterality: Right;  25min  . HEMORRHOID SURGERY    . PENILE PROSTHESIS IMPLANT    . PROSTATECTOMY    . TOTAL KNEE ARTHROPLASTY Right 10/18/2018   Procedure: RIGHT TOTAL KNEE ARTHROPLASTY;  Surgeon: Gaynelle Arabian, MD;  Location: [facility];  Service: Orthopedics;  Laterality: Right;  46min    No current facility-administered medications for this encounter.   Current Outpatient Medications  Medication Sig Dispense Refill Last Dose  . amLODipine (NORVASC) 10 MG tablet Take 10 mg by mouth daily.  2   . atorvastatin (LIPITOR) 20 MG tablet Take 20 mg by mouth daily.  0   . dorzolamide-timolol (COSOPT) 22.3-6.8 MG/ML ophthalmic solution Place 1 drop into both eyes 2 (two) times daily.  0   . latanoprost (XALATAN) 0.005 % ophthalmic solution Place 1 drop into both eyes every morning.  0   . lisinopril (ZESTRIL) 30 MG tablet Take 30 mg by mouth daily.     . methocarbamol (ROBAXIN) 500 MG tablet Take 1 tablet (500 mg total) by mouth every 6 (six) hours as needed for muscle spasms. 40 tablet 0   . omeprazole (PRILOSEC) 20 MG capsule Take 20 mg by mouth daily.  3   . oxyCODONE (OXY IR/ROXICODONE) 5 MG immediate release tablet Take 1-2 tablets (5-10 mg total) by mouth every 6 (six) hours as needed for severe  pain. 56 tablet 0   . polyethylene glycol (MIRALAX / GLYCOLAX) packet Take 17 g by mouth daily as needed for moderate constipation.     . polyvinyl alcohol (LIQUIFILM TEARS) 1.4 % ophthalmic solution Place 1 drop into both eyes as needed for dry eyes.     . Rivaroxaban 15 & 20 MG TBPK Follow package directions: Take one 15mg  tablet by mouth twice a day. On day 22, switch to one 20mg  tablet once a day. Take with food. 51 each 0   . traMADol (ULTRAM) 50 MG tablet Take 1-2 tablets (50-100 mg total) by mouth every 6 (six) hours as needed for moderate pain. 40 tablet 0    Allergies  Allergen Reactions  . Lovastatin Itching  . Penicillins Rash    Has patient had a PCN reaction causing  immediate rash, facial/tongue/throat swelling, SOB or lightheadedness with hypotension: No Has patient had a PCN reaction causing severe rash involving mucus membranes or skin necrosis: No Has patient had a PCN reaction that required hospitalization: No Has patient had a PCN reaction occurring within the last 10 years: Yes If all of the above answers are "NO", then may proceed with Cephalosporin use.     Social History   Tobacco Use  . Smoking status: Former Research scientist (life sciences)  . Smokeless tobacco: Never Used  Substance Use Topics  . Alcohol use: Never    No family history on file.    Review of Systems  Constitutional: Negative for chills and fever.  HENT: Negative for congestion, sore throat and tinnitus.   Eyes: Negative for photophobia and pain.  Respiratory: Negative for cough, shortness of breath and wheezing.   Cardiovascular: Negative for chest pain and palpitations.  Gastrointestinal: Negative for nausea and vomiting.  Genitourinary: Negative for dysuria, frequency and urgency.  Neurological: Negative for dizziness, weakness and headaches.     Objective:  Physical Exam Well-developed male alert and oriented in no apparent distress His right knee shows no warmth or effusion Range 5-100 He is ambulating with a cane  Labs:  Estimated body mass index is 27.92 kg/m as calculated from the following:   Height as of 09/12/19: 5' 11.5" (1.816 m).   Weight as of 09/12/19: 92.1 kg.  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 Treatment options including reimplantation of the right knee was discussed with the patient and he elects to proceed. Risks and benefits of reimplantation were presented and reviewed. Patient will be admitted for inpatient treatment for surgery, pain control, PT, OT, prophylactic antibiotics, VTE prophylaxis, progressive ambulation, ADL's and discharge planning.  Patient is planning to be discharged home.    - Patient was instructed on what medications to stop prior to surgery. - Follow-up visit in 2 weeks with Dr. Wynelle Link - Begin physical therapy following surgery - Pre-operative lab work as pre-surgical testing - Prescriptions will be provided in hospital at time of discharge  Theresa Duty, PA-C Orthopedic Surgery [facility] [place]

## 2019-11-29 NOTE — Patient Instructions (Signed)
DUE TO COVID-19 ONLY ONE VISITOR IS ALLOWED TO COME WITH YOU AND STAY IN THE WAITING ROOM ONLY DURING PRE OP AND PROCEDURE DAY OF SURGERY. THE 1 VISITOR MAY VISIT WITH YOU AFTER SURGERY IN YOUR PRIVATE ROOM DURING VISITING HOURS ONLY!  YOU NEED TO HAVE A COVID 19 TEST ON: 12/01/19 @     , THIS TEST MUST BE DONE BEFORE SURGERY, COME  [place], [place] , [address].  ([place]) ONCE YOUR COVID TEST IS COMPLETED, PLEASE BEGIN THE QUARANTINE INSTRUCTIONS AS OUTLINED IN YOUR HANDOUT.                Drew Hudson    Your procedure is scheduled on: 12/05/2019   Report to   [facility] Main  Entrance   Report to: SHORT STAY at: 5:30 AM     Call this number if you have problems the morning of surgery [phone]    Remember:    [place], NO [place].     Take these medicines the morning of surgery with A SIP OF WATER: AMLODIPINE,ATORVASTATIN,OMEPRAZOLE.  THE DAY BEFORE YOUR SURGERY: TAKE YOUR METFORMIN AS USUAL THE DAY OF YOUR SURGERY: DO NOT TAKE ANY DIABETIC MEDICATIONS DAY OF YOUR SURGERY                                          You may not have any metal on your body including hair pins and              piercings  Do not wear jewelry, lotions, powders or perfumes, deodorant              Men may shave face and neck.   Do not bring valuables to the hospital. [place].  Contacts, dentures or bridgework may not be worn into surgery.  Leave suitcase in the car. After surgery it may be brought to your room.     Patients discharged the day of surgery will not be allowed to drive home. IF YOU ARE HAVING SURGERY AND GOING HOME THE SAME DAY, YOU MUST HAVE AN ADULT TO DRIVE YOU HOME AND  BE WITH YOU FOR 24 HOURS. YOU MAY GO HOME BY TAXI OR UBER OR ORTHERWISE, BUT AN ADULT MUST ACCOMPANY YOU HOME AND STAY WITH YOU FOR 24 HOURS.  Name and  phone number of your driver:  Special Instructions: N/A              Please read over the following fact sheets you were given: _____________________________________________________________________             NO SOLID FOOD AFTER MIDNIGHT THE NIGHT PRIOR TO SURGERY. NOTHING BY MOUTH EXCEPT CLEAR LIQUIDS UNTIL: 4:00 AM . PLEASE FINISH GATORADE DRINK PER SURGEON ORDER  WHICH NEEDS TO BE COMPLETED AT: 4:00 AM .   CLEAR LIQUID DIET   Foods Allowed  Foods Excluded  Coffee and tea, regular and decaf                             liquids that you cannot  Plain Jell-O any favor except red or purple                                           see through such as: Fruit ices (not with fruit pulp)                                     milk, soups, orange juice  Iced Popsicles                                    All solid food Carbonated beverages, regular and diet                                    Cranberry, grape and apple juices Sports drinks like Gatorade Lightly seasoned clear broth or consume(fat free) Sugar, honey syrup  Sample Menu Breakfast                                Lunch                                     Supper Cranberry juice                    Beef broth                            Chicken broth Jell-O                                     Grape juice                           Apple juice Coffee or tea                        Jell-O                                      Popsicle                                                Coffee or tea                        Coffee or tea  _____________________________________________________________________   [facility] - Preparing for Surgery Before surgery, you can play an important role.  Because skin is not sterile, your  skin needs to be as free of germs as possible.  You can reduce the number of germs on your skin by washing with CHG (chlorahexidine gluconate) soap before  surgery.  CHG is an antiseptic cleaner which kills germs and bonds with the skin to continue killing germs even after washing. Please DO NOT use if you have an allergy to CHG or antibacterial soaps.  If your skin becomes reddened/irritated stop using the CHG and inform your nurse when you arrive at Short Stay. Do not shave (including legs and underarms) for at least 48 hours prior to the first CHG shower.  You may shave your face/neck. Please follow these instructions carefully:  1.  Shower with CHG Soap the night before surgery and the  morning of Surgery.  2.  If you choose to wash your hair, wash your hair first as usual with your  normal  shampoo.  3.  After you shampoo, rinse your hair and body thoroughly to remove the  shampoo.                           4.  Use CHG as you would any other liquid soap.  You can apply chg directly  to the skin and wash                       Gently with a scrungie or clean washcloth.  5.  Apply the CHG Soap to your body ONLY FROM THE NECK DOWN.   Do not use on face/ open                           Wound or open sores. Avoid contact with eyes, ears mouth and genitals (private parts).                       Wash face,  Genitals (private parts) with your normal soap.             6.  Wash thoroughly, paying special attention to the area where your surgery  will be performed.  7.  Thoroughly rinse your body with warm water from the neck down.  8.  DO NOT shower/wash with your normal soap after using and rinsing off  the CHG Soap.                9.  Pat yourself dry with a clean towel.            10.  Wear clean pajamas.            11.  Place clean sheets on your bed the night of your first shower and do not  sleep with pets. Day of Surgery : Do not apply any lotions/deodorants the morning of surgery.  Please wear clean clothes to the hospital/surgery center.  FAILURE TO FOLLOW THESE INSTRUCTIONS MAY RESULT IN THE CANCELLATION OF YOUR SURGERY PATIENT  SIGNATURE_________________________________  NURSE SIGNATURE__________________________________  ________________________________________________________________________    Drew Hudson  An incentive spirometer is a tool that can help keep your lungs clear and active. This tool measures how well you are filling your lungs with each breath. Taking long deep breaths may help reverse or decrease the chance of developing breathing (pulmonary) problems (especially infection) following:  A long period of time when you are unable to move or be active. BEFORE THE PROCEDURE   If the spirometer includes an  indicator to show your best effort, your nurse or respiratory therapist will set it to a desired goal.  If possible, sit up straight or lean slightly forward. Try not to slouch.  Hold the incentive spirometer in an upright position. INSTRUCTIONS FOR USE  1. Sit on the edge of your bed if possible, or sit up as far as you can in bed or on a chair. 2. Hold the incentive spirometer in an upright position. 3. Breathe out normally. 4. Place the mouthpiece in your mouth and seal your lips tightly around it. 5. Breathe in slowly and as deeply as possible, raising the piston or the ball toward the top of the column. 6. Hold your breath for 3-5 seconds or for as long as possible. Allow the piston or ball to fall to the bottom of the column. 7. Remove the mouthpiece from your mouth and breathe out normally. 8. Rest for a few seconds and repeat Steps 1 through 7 at least 10 times every 1-2 hours when you are awake. Take your time and take a few normal breaths between deep breaths. 9. The spirometer may include an indicator to show your best effort. Use the indicator as a goal to work toward during each repetition. 10. After each set of 10 deep breaths, practice coughing to be sure your lungs are clear. If you have an incision (the cut made at the time of surgery), support your incision when coughing  by placing a pillow or rolled up towels firmly against it. Once you are able to get out of bed, walk around indoors and cough well. You may stop using the incentive spirometer when instructed by your caregiver.  RISKS AND COMPLICATIONS  Take your time so you do not get dizzy or light-headed.  If you are in pain, you may need to take or ask for pain medication before doing incentive spirometry. It is harder to take a deep breath if you are having pain. AFTER USE  Rest and breathe slowly and easily.  It can be helpful to keep track of a log of your progress. Your caregiver can provide you with a simple table to help with this. If you are using the spirometer at home, follow these instructions: [place] IF:   You are having difficultly using the spirometer.  You have trouble using the spirometer as often as instructed.  Your pain medication is not giving enough relief while using the spirometer.  You develop fever of 100.5 F (38.1 C) or higher. SEEK IMMEDIATE MEDICAL CARE IF:   You cough up bloody sputum that had not been present before.  You develop fever of 102 F (38.9 C) or greater.  You develop worsening pain at or near the incision site. MAKE SURE YOU:   Understand these instructions.  Will watch your condition.  Will get help right away if you are not doing well or get worse. Document Released: 03/02/2007 Document Revised: 01/12/2012 Document Reviewed: 05/03/2007 ExitCare Patient Information 2014 ExitCare, [place].   ________________________________________________________________________  WHAT IS A BLOOD TRANSFUSION? Blood Transfusion Information  A transfusion is the replacement of blood or some of its parts. Blood is made up of multiple cells which provide different functions.  Red blood cells carry oxygen and are used for blood loss replacement.  White blood cells fight against infection.  Platelets control bleeding.  Plasma helps clot  blood.  Other blood products are available for specialized needs, such as hemophilia or other clotting disorders. BEFORE THE TRANSFUSION  Who gives blood for transfusions?   Healthy volunteers who are fully evaluated to make sure their blood is safe. This is blood bank blood. Transfusion therapy is the safest it has ever been in the practice of medicine. Before blood is taken from a donor, a complete history is taken to make sure that person has no history of diseases nor engages in risky social behavior (examples are intravenous drug use or sexual activity with multiple partners). The donor's travel history is screened to minimize risk of transmitting infections, such as malaria. The donated blood is tested for signs of infectious diseases, such as HIV and hepatitis. The blood is then tested to be sure it is compatible with you in order to minimize the chance of a transfusion reaction. If you or a relative donates blood, this is often done in anticipation of surgery and is not appropriate for emergency situations. It takes many days to process the donated blood. RISKS AND COMPLICATIONS Although transfusion therapy is very safe and saves many lives, the main dangers of transfusion include:   Getting an infectious disease.  Developing a transfusion reaction. This is an allergic reaction to something in the blood you were given. Every precaution is taken to prevent this. The decision to have a blood transfusion has been considered carefully by your caregiver before blood is given. Blood is not given unless the benefits outweigh the risks. AFTER THE TRANSFUSION  Right after receiving a blood transfusion, you will usually feel much better and more energetic. This is especially true if your red blood cells have gotten low (anemic). The transfusion raises the level of the red blood cells which carry oxygen, and this usually causes an energy increase.  The nurse administering the transfusion will monitor  you carefully for complications. HOME CARE INSTRUCTIONS  No special instructions are needed after a transfusion. You may find your energy is better. Speak with your caregiver about any limitations on activity for underlying diseases you may have. SEEK MEDICAL CARE IF:   Your condition is not improving after your transfusion.  You develop redness or irritation at the intravenous (IV) site. SEEK IMMEDIATE MEDICAL CARE IF:  Any of the following symptoms occur over the next 12 hours:  Shaking chills.  You have a temperature by mouth above 102 F (38.9 C), not controlled by medicine.  Chest, back, or muscle pain.  People around you feel you are not acting correctly or are confused.  Shortness of breath or difficulty breathing.  Dizziness and fainting.  You get a rash or develop hives.  You have a decrease in urine output.  Your urine turns a dark color or changes to pink, red, or brown. Any of the following symptoms occur over the next 10 days:  You have a temperature by mouth above 102 F (38.9 C), not controlled by medicine.  Shortness of breath.  Weakness after normal activity.  The white part of the eye turns yellow (jaundice).  You have a decrease in the amount of urine or are urinating less often.  Your urine turns a dark color or changes to pink, red, or brown. Document Released: 10/17/2000 Document Revised: 01/12/2012 Document Reviewed: 06/05/2008 [facility] Patient Information 2014 [place], [place].  _______________________________________________________________________

## 2019-11-30 ENCOUNTER — Other Ambulatory Visit: Payer: Self-pay

## 2019-11-30 ENCOUNTER — Encounter (HOSPITAL_COMMUNITY): Payer: Self-pay

## 2019-11-30 ENCOUNTER — Encounter (HOSPITAL_COMMUNITY)
Admission: RE | Admit: 2019-11-30 | Discharge: 2019-11-30 | Disposition: A | Discharge status: Home / Self Care | Payer: Medicare HMO | Source: Ambulatory Visit | Attending: Orthopedic Surgery | Admitting: Orthopedic Surgery

## 2019-11-30 DIAGNOSIS — Z96651 Presence of right artificial knee joint: Secondary | ICD-10-CM | POA: Insufficient documentation

## 2019-11-30 DIAGNOSIS — Z01812 Encounter for preprocedural laboratory examination: Secondary | ICD-10-CM | POA: Diagnosis not present

## 2019-11-30 HISTORY — DX: Pneumonia, unspecified organism: J18.9

## 2019-11-30 HISTORY — DX: Cardiac arrhythmia, unspecified: I49.9

## 2019-11-30 LAB — COMPREHENSIVE METABOLIC PANEL
ALT: 13 U/L (ref 0–44)
AST: 16 U/L (ref 15–41)
Albumin: 3.7 g/dL (ref 3.5–5.0)
Alkaline Phosphatase: 68 U/L (ref 38–126)
Anion gap: 10 (ref 5–15)
BUN: 18 mg/dL (ref 8–23)
CO2: 22 mmol/L (ref 22–32)
Calcium: 9.3 mg/dL (ref 8.9–10.3)
Chloride: 110 mmol/L (ref 98–111)
Creatinine, Ser: 1.45 mg/dL — ABNORMAL HIGH (ref 0.61–1.24)
GFR calc Af Amer: 52 mL/min — ABNORMAL LOW (ref >60)
GFR calc non Af Amer: 45 mL/min — ABNORMAL LOW (ref >60)
Glucose, Bld: 82 mg/dL (ref 70–99)
Potassium: 4.4 mmol/L (ref 3.5–5.1)
Sodium: 142 mmol/L (ref 135–145)
Total Bilirubin: 0.8 mg/dL (ref 0.3–1.2)
Total Protein: 7.2 g/dL (ref 6.5–8.1)

## 2019-11-30 LAB — CBC
HCT: 40.9 % (ref 39.0–52.0)
Hemoglobin: 13.3 g/dL (ref 13.0–17.0)
MCH: 27.4 pg (ref 26.0–34.0)
MCHC: 32.5 g/dL (ref 30.0–36.0)
MCV: 84.3 fL (ref 80.0–100.0)
Platelets: 177 10*3/uL (ref 150–400)
RBC: 4.85 MIL/uL (ref 4.22–5.81)
RDW: 14.8 % (ref 11.5–15.5)
WBC: 4.4 10*3/uL (ref 4.0–10.5)
nRBC: 0 % (ref 0.0–0.2)

## 2019-11-30 LAB — APTT: aPTT: 59 seconds — ABNORMAL HIGH (ref 24–36)

## 2019-11-30 LAB — SURGICAL PCR SCREEN
MRSA, PCR: NEGATIVE
Staphylococcus aureus: NEGATIVE

## 2019-11-30 LAB — PROTIME-INR
INR: 2.7 — ABNORMAL HIGH (ref 0.8–1.2)
Prothrombin Time: 28.7 seconds — ABNORMAL HIGH (ref 11.4–15.2)

## 2019-11-30 NOTE — Progress Notes (Addendum)
PCP -  Cardiologist -   Chest x-ray -  EKG - 08/19/19 EPIC Stress Test -  ECHO -  Cardiac Cath -   Sleep Study - yes CPAP - yes  Fasting Blood Sugar - 109 Checks Blood Sugar 3 times a week  Blood Thinner Instructions: As per pt. And his daughter,pt. Still taking this medication and did not received any instructions about it on regard the upcoming surgery.RN instructed the pt.'s daughter to call Dr's Aluisio office to let them know that the pt. Is on Xarelto. Aspirin Instructions: Last Dose:  Anesthesia review:   Patient denies shortness of breath, fever, cough and chest pain at PAT appointment   Patient verbalized understanding of instructions that were given to them at the PAT appointment. Patient was also instructed that they will need to review over the PAT instructions again at home before surgery.

## 2019-12-01 ENCOUNTER — Other Ambulatory Visit (HOSPITAL_COMMUNITY)
Admission: RE | Admit: 2019-12-01 | Discharge: 2019-12-01 | Disposition: A | Discharge status: Home / Self Care | Payer: Medicare HMO | Source: Ambulatory Visit | Attending: Orthopedic Surgery | Admitting: Orthopedic Surgery

## 2019-12-01 DIAGNOSIS — Z20822 Contact with and (suspected) exposure to covid-19: Secondary | ICD-10-CM | POA: Diagnosis not present

## 2019-12-01 DIAGNOSIS — Z01812 Encounter for preprocedural laboratory examination: Secondary | ICD-10-CM | POA: Insufficient documentation

## 2019-12-01 LAB — SARS CORONAVIRUS 2 (TAT 6-24 HRS): SARS Coronavirus 2: NEGATIVE

## 2019-12-04 MED ORDER — BUPIVACAINE LIPOSOME 1.3 % IJ SUSP
20.0000 mL | Freq: Once | INTRAMUSCULAR | Status: DC
  Filled 2019-12-04: qty 20

## 2019-12-04 NOTE — Anesthesia Preprocedure Evaluation (Addendum)
Anesthesia Evaluation  Patient identified by MRN, date of birth, ID band Patient awake    Reviewed: Allergy & Precautions, NPO status , Patient's Chart, lab work & pertinent test results  History of Anesthesia Complications Negative for: history of anesthetic complications  Airway Mallampati: II  TM Distance: >3 FB Neck ROM: Full    Dental  (+) Partial Upper, Partial Lower, Dental Advisory Given   Pulmonary sleep apnea and Continuous Positive Airway Pressure Ventilation , former smoker,    Pulmonary exam normal        Cardiovascular hypertension, Pt. on medications Normal cardiovascular exam  ECG: rate 58. Sinus bradycardia Right bundle branch block   Neuro/Psych  Headaches, Idiopathic peripheral neuropathy  Neuromuscular disease negative psych ROS   GI/Hepatic Neg liver ROS, GERD  Medicated and Controlled,  Endo/Other  diabetes  Renal/GU Renal InsufficiencyRenal disease     Musculoskeletal  (+) Arthritis , Ambulates with cane   Abdominal   Peds  Hematology  (+) anemia , HLD   Anesthesia Other Findings Infected right total knee arthroplasty  Reproductive/Obstetrics                            Anesthesia Physical  Anesthesia Plan  ASA: II  Anesthesia Plan:    Post-op Pain Management:  [place] for Post-op pain   Induction: Intravenous  PONV Risk Score and Plan: 3 and Ondansetron, Midazolam and Scopolamine patch - Pre-op  Airway Management Planned:   Additional Equipment:   Intra-op Plan:   Post-operative Plan: Extubation in OR  Informed Consent: I have reviewed the patients History and Physical, chart, labs and discussed the procedure including the risks, benefits and alternatives for the proposed anesthesia with the patient or authorized representative who has indicated his/her understanding and acceptance.     Dental advisory given  Plan Discussed with: CRNA,  Anesthesiologist and Surgeon  Anesthesia Plan Comments:       Anesthesia Quick Evaluation

## 2019-12-05 ENCOUNTER — Other Ambulatory Visit: Payer: Self-pay

## 2019-12-05 ENCOUNTER — Inpatient Hospital Stay (HOSPITAL_COMMUNITY): Payer: Medicare HMO | Admitting: Certified Registered Nurse Anesthetist

## 2019-12-05 ENCOUNTER — Inpatient Hospital Stay (HOSPITAL_COMMUNITY)
Admission: RE | Admit: 2019-12-05 | Discharge: 2019-12-06 | DRG: 467 | Disposition: A | Discharge status: Home Health | Payer: Medicare HMO | Attending: Orthopedic Surgery | Admitting: Orthopedic Surgery

## 2019-12-05 ENCOUNTER — Encounter (HOSPITAL_COMMUNITY)
Admission: RE | Disposition: A | Discharge status: Home Health | Payer: Self-pay | Source: Home / Self Care | Attending: Orthopedic Surgery

## 2019-12-05 ENCOUNTER — Encounter (HOSPITAL_COMMUNITY): Payer: Self-pay | Admitting: Orthopedic Surgery

## 2019-12-05 DIAGNOSIS — K219 Gastro-esophageal reflux disease without esophagitis: Secondary | ICD-10-CM | POA: Diagnosis present

## 2019-12-05 DIAGNOSIS — Z880 Allergy status to penicillin: Secondary | ICD-10-CM | POA: Diagnosis not present

## 2019-12-05 DIAGNOSIS — Z87891 Personal history of nicotine dependence: Secondary | ICD-10-CM | POA: Diagnosis not present

## 2019-12-05 DIAGNOSIS — E785 Hyperlipidemia, unspecified: Secondary | ICD-10-CM | POA: Diagnosis present

## 2019-12-05 DIAGNOSIS — M21861 Other specified acquired deformities of right lower leg: Principal | ICD-10-CM | POA: Diagnosis present

## 2019-12-05 DIAGNOSIS — G4733 Obstructive sleep apnea (adult) (pediatric): Secondary | ICD-10-CM | POA: Diagnosis present

## 2019-12-05 DIAGNOSIS — H409 Unspecified glaucoma: Secondary | ICD-10-CM | POA: Diagnosis present

## 2019-12-05 DIAGNOSIS — Z888 Allergy status to other drugs, medicaments and biological substances status: Secondary | ICD-10-CM

## 2019-12-05 DIAGNOSIS — M009 Pyogenic arthritis, unspecified: Secondary | ICD-10-CM | POA: Diagnosis present

## 2019-12-05 DIAGNOSIS — Z7901 Long term (current) use of anticoagulants: Secondary | ICD-10-CM

## 2019-12-05 DIAGNOSIS — Z79899 Other long term (current) drug therapy: Secondary | ICD-10-CM | POA: Diagnosis not present

## 2019-12-05 DIAGNOSIS — I4510 Unspecified right bundle-branch block: Secondary | ICD-10-CM | POA: Diagnosis present

## 2019-12-05 DIAGNOSIS — I10 Essential (primary) hypertension: Secondary | ICD-10-CM | POA: Diagnosis present

## 2019-12-05 DIAGNOSIS — M1711 Unilateral primary osteoarthritis, right knee: Secondary | ICD-10-CM

## 2019-12-05 DIAGNOSIS — Z20822 Contact with and (suspected) exposure to covid-19: Secondary | ICD-10-CM | POA: Diagnosis present

## 2019-12-05 HISTORY — PX: REIMPLANTATION OF TOTAL KNEE: SHX6052

## 2019-12-05 LAB — TYPE AND SCREEN
ABO/RH(D): O POS
Antibody Screen: NEGATIVE

## 2019-12-05 LAB — GLUCOSE, CAPILLARY
Glucose-Capillary: 116 mg/dL — ABNORMAL HIGH (ref 70–99)
Glucose-Capillary: 98 mg/dL (ref 70–99)
Glucose-Capillary: 179 mg/dL — ABNORMAL HIGH (ref 70–99)

## 2019-12-05 SURGERY — REVISION, TOTAL ARTHROPLASTY, KNEE
Anesthesia: General | Site: Knee | Laterality: Right

## 2019-12-05 MED ORDER — ONDANSETRON HCL 4 MG/2ML IJ SOLN
INTRAMUSCULAR | Status: DC | PRN
  Administered 2019-12-05: 4 mg via INTRAVENOUS

## 2019-12-05 MED ORDER — 0.9 % SODIUM CHLORIDE (POUR BTL) OPTIME
TOPICAL | Status: DC | PRN
  Administered 2019-12-05: 08:00:00 1000 mL

## 2019-12-05 MED ORDER — OXYCODONE HCL 5 MG PO TABS
5.0000 mg | ORAL_TABLET | ORAL | Status: DC | PRN
  Administered 2019-12-05: 10 mg via ORAL
  Filled 2019-12-05: qty 2

## 2019-12-05 MED ORDER — CHLORHEXIDINE GLUCONATE 4 % EX LIQD: 60.0000 mL | Freq: Once | CUTANEOUS | Status: DC

## 2019-12-05 MED ORDER — FENTANYL CITRATE (PF) 100 MCG/2ML IJ SOLN
INTRAMUSCULAR | Status: DC | PRN
  Administered 2019-12-05 (×7): 50 ug via INTRAVENOUS

## 2019-12-05 MED ORDER — BISACODYL 10 MG RE SUPP: 10.0000 mg | Freq: Every day | RECTAL | Status: DC | PRN

## 2019-12-05 MED ORDER — ONDANSETRON HCL 4 MG PO TABS
4.0000 mg | ORAL_TABLET | Freq: Four times a day (QID) | ORAL | Status: DC | PRN
  Administered 2019-12-05: 4 mg via ORAL
  Filled 2019-12-05: qty 1

## 2019-12-05 MED ORDER — PROMETHAZINE HCL 25 MG/ML IJ SOLN: 6.2500 mg | INTRAMUSCULAR | Status: DC | PRN

## 2019-12-05 MED ORDER — STERILE WATER FOR IRRIGATION IR SOLN
Status: DC | PRN
  Administered 2019-12-05: 1000 mL

## 2019-12-05 MED ORDER — SODIUM CHLORIDE (PF) 0.9 % IJ SOLN
INTRAMUSCULAR | Status: DC | PRN
  Administered 2019-12-05: 60 mL

## 2019-12-05 MED ORDER — FENTANYL CITRATE (PF) 100 MCG/2ML IJ SOLN
25.0000 ug | INTRAMUSCULAR | Status: DC | PRN
  Administered 2019-12-05 (×2): 50 ug via INTRAVENOUS

## 2019-12-05 MED ORDER — FERROUS SULFATE 325 (65 FE) MG PO TABS
325.0000 mg | ORAL_TABLET | Freq: Every day | ORAL | Status: DC
  Administered 2019-12-06: 325 mg via ORAL
  Filled 2019-12-05: qty 1

## 2019-12-05 MED ORDER — TRANEXAMIC ACID-NACL 1000-0.7 MG/100ML-% IV SOLN
1000.0000 mg | INTRAVENOUS | Status: AC
  Administered 2019-12-05: 1000 mg via INTRAVENOUS
  Filled 2019-12-05: qty 100

## 2019-12-05 MED ORDER — SODIUM CHLORIDE 0.9 % IR SOLN
Status: DC | PRN
  Administered 2019-12-05: 1000 mL

## 2019-12-05 MED ORDER — MORPHINE SULFATE (PF) 2 MG/ML IV SOLN: 1.0000 mg | INTRAVENOUS | Status: DC | PRN

## 2019-12-05 MED ORDER — DEXAMETHASONE SODIUM PHOSPHATE 10 MG/ML IJ SOLN
INTRAMUSCULAR | Status: AC
  Filled 2019-12-05: qty 1

## 2019-12-05 MED ORDER — RIVAROXABAN 10 MG PO TABS
10.0000 mg | ORAL_TABLET | Freq: Every day | ORAL | Status: DC
  Administered 2019-12-06: 10 mg via ORAL
  Filled 2019-12-05: qty 1

## 2019-12-05 MED ORDER — MENTHOL 3 MG MT LOZG: 1.0000 | LOZENGE | OROMUCOSAL | Status: DC | PRN

## 2019-12-05 MED ORDER — PANTOPRAZOLE SODIUM 40 MG PO TBEC: 40.0000 mg | DELAYED_RELEASE_TABLET | Freq: Every evening | ORAL | Status: DC

## 2019-12-05 MED ORDER — ONDANSETRON HCL 4 MG/2ML IJ SOLN
INTRAMUSCULAR | Status: AC
  Filled 2019-12-05: qty 2

## 2019-12-05 MED ORDER — TRAMADOL HCL 50 MG PO TABS
50.0000 mg | ORAL_TABLET | Freq: Four times a day (QID) | ORAL | Status: DC | PRN
  Administered 2019-12-06: 100 mg via ORAL
  Filled 2019-12-05: qty 2

## 2019-12-05 MED ORDER — POLYETHYLENE GLYCOL 3350 17 G PO PACK: 17.0000 g | PACK | Freq: Every day | ORAL | Status: DC | PRN

## 2019-12-05 MED ORDER — CELECOXIB 200 MG PO CAPS
400.0000 mg | ORAL_CAPSULE | Freq: Once | ORAL | Status: AC
  Administered 2019-12-05: 400 mg via ORAL
  Filled 2019-12-05: qty 2

## 2019-12-05 MED ORDER — PROPOFOL 500 MG/50ML IV EMUL
INTRAVENOUS | Status: AC
  Filled 2019-12-05: qty 50

## 2019-12-05 MED ORDER — METHOCARBAMOL 500 MG IVPB - SIMPLE MED
500.0000 mg | Freq: Four times a day (QID) | INTRAVENOUS | Status: DC | PRN
  Administered 2019-12-05: 10:00:00 500 mg via INTRAVENOUS
  Filled 2019-12-05: qty 50

## 2019-12-05 MED ORDER — VANCOMYCIN HCL IN DEXTROSE 1-5 GM/200ML-% IV SOLN
1000.0000 mg | Freq: Two times a day (BID) | INTRAVENOUS | Status: AC
  Administered 2019-12-05: 1000 mg via INTRAVENOUS
  Filled 2019-12-05: qty 200

## 2019-12-05 MED ORDER — DEXAMETHASONE SODIUM PHOSPHATE 10 MG/ML IJ SOLN
8.0000 mg | Freq: Once | INTRAMUSCULAR | Status: AC
  Administered 2019-12-05: 8 mg via INTRAVENOUS

## 2019-12-05 MED ORDER — CYCLOBENZAPRINE HCL 10 MG PO TABS: 10.0000 mg | ORAL_TABLET | Freq: Three times a day (TID) | ORAL | Status: DC | PRN

## 2019-12-05 MED ORDER — DEXAMETHASONE SODIUM PHOSPHATE 10 MG/ML IJ SOLN
10.0000 mg | Freq: Once | INTRAMUSCULAR | Status: AC
  Administered 2019-12-06: 10 mg via INTRAVENOUS
  Filled 2019-12-05: qty 1

## 2019-12-05 MED ORDER — METHOCARBAMOL 500 MG IVPB - SIMPLE MED
INTRAVENOUS | Status: AC
  Filled 2019-12-05: qty 50

## 2019-12-05 MED ORDER — ACETAMINOPHEN 500 MG PO TABS
1000.0000 mg | ORAL_TABLET | Freq: Four times a day (QID) | ORAL | Status: AC
  Administered 2019-12-05 – 2019-12-06 (×3): 1000 mg via ORAL
  Filled 2019-12-05 (×3): qty 2

## 2019-12-05 MED ORDER — PHENYLEPHRINE HCL (PRESSORS) 10 MG/ML IV SOLN
INTRAVENOUS | Status: AC
  Filled 2019-12-05: qty 1

## 2019-12-05 MED ORDER — LACTATED RINGERS IV SOLN: INTRAVENOUS | Status: DC

## 2019-12-05 MED ORDER — FENTANYL CITRATE (PF) 100 MCG/2ML IJ SOLN
INTRAMUSCULAR | Status: AC
  Filled 2019-12-05: qty 2

## 2019-12-05 MED ORDER — POVIDONE-IODINE 10 % EX SWAB
2.0000 "application " | Freq: Once | CUTANEOUS | Status: AC
  Administered 2019-12-05: 2 via TOPICAL

## 2019-12-05 MED ORDER — SODIUM CHLORIDE (PF) 0.9 % IJ SOLN
INTRAMUSCULAR | Status: AC
  Filled 2019-12-05: qty 50

## 2019-12-05 MED ORDER — METOCLOPRAMIDE HCL 5 MG PO TABS: 5.0000 mg | ORAL_TABLET | Freq: Three times a day (TID) | ORAL | Status: DC | PRN

## 2019-12-05 MED ORDER — FLEET ENEMA 7-19 GM/118ML RE ENEM: 1.0000 | ENEMA | Freq: Once | RECTAL | Status: DC | PRN

## 2019-12-05 MED ORDER — ACETAMINOPHEN 500 MG PO TABS: 1000.0000 mg | ORAL_TABLET | Freq: Once | ORAL | Status: DC

## 2019-12-05 MED ORDER — PHENOL 1.4 % MT LIQD
1.0000 | OROMUCOSAL | Status: DC | PRN
  Filled 2019-12-05: qty 177

## 2019-12-05 MED ORDER — CEFAZOLIN SODIUM-DEXTROSE 2-4 GM/100ML-% IV SOLN
2.0000 g | INTRAVENOUS | Status: AC
  Administered 2019-12-05: 2 g via INTRAVENOUS
  Filled 2019-12-05: qty 100

## 2019-12-05 MED ORDER — ATORVASTATIN CALCIUM 20 MG PO TABS: 20.0000 mg | ORAL_TABLET | Freq: Every day | ORAL | Status: DC

## 2019-12-05 MED ORDER — DORZOLAMIDE HCL-TIMOLOL MAL 2-0.5 % OP SOLN
1.0000 [drp] | Freq: Two times a day (BID) | OPHTHALMIC | Status: DC
  Administered 2019-12-05 – 2019-12-06 (×2): 1 [drp] via OPHTHALMIC
  Filled 2019-12-05: qty 10

## 2019-12-05 MED ORDER — MIDAZOLAM HCL 2 MG/2ML IJ SOLN
INTRAMUSCULAR | Status: AC
  Filled 2019-12-05: qty 2

## 2019-12-05 MED ORDER — SODIUM CHLORIDE 0.9 % IV SOLN: INTRAVENOUS | Status: DC

## 2019-12-05 MED ORDER — LIDOCAINE 2% (20 MG/ML) 5 ML SYRINGE
INTRAMUSCULAR | Status: AC
  Filled 2019-12-05: qty 5

## 2019-12-05 MED ORDER — METOCLOPRAMIDE HCL 5 MG/ML IJ SOLN: 5.0000 mg | Freq: Three times a day (TID) | INTRAMUSCULAR | Status: DC | PRN

## 2019-12-05 MED ORDER — AMLODIPINE BESYLATE 10 MG PO TABS
10.0000 mg | ORAL_TABLET | Freq: Every day | ORAL | Status: DC
  Administered 2019-12-05: 10 mg via ORAL
  Filled 2019-12-05: qty 1

## 2019-12-05 MED ORDER — DOCUSATE SODIUM 100 MG PO CAPS
100.0000 mg | ORAL_CAPSULE | Freq: Two times a day (BID) | ORAL | Status: DC
  Administered 2019-12-05 – 2019-12-06 (×2): 100 mg via ORAL
  Filled 2019-12-05 (×2): qty 1

## 2019-12-05 MED ORDER — BUPIVACAINE LIPOSOME 1.3 % IJ SUSP
INTRAMUSCULAR | Status: DC | PRN
  Administered 2019-12-05: 20 mL

## 2019-12-05 MED ORDER — GABAPENTIN 300 MG PO CAPS
300.0000 mg | ORAL_CAPSULE | Freq: Three times a day (TID) | ORAL | Status: DC
  Administered 2019-12-05 – 2019-12-06 (×3): 300 mg via ORAL
  Filled 2019-12-05 (×3): qty 1

## 2019-12-05 MED ORDER — FENTANYL CITRATE (PF) 250 MCG/5ML IJ SOLN
INTRAMUSCULAR | Status: AC
  Filled 2019-12-05: qty 5

## 2019-12-05 MED ORDER — PROPOFOL 10 MG/ML IV BOLUS
INTRAVENOUS | Status: AC
  Filled 2019-12-05: qty 20

## 2019-12-05 MED ORDER — ACETAMINOPHEN 10 MG/ML IV SOLN
1000.0000 mg | Freq: Four times a day (QID) | INTRAVENOUS | Status: DC
  Administered 2019-12-05: 1000 mg via INTRAVENOUS
  Filled 2019-12-05: qty 100

## 2019-12-05 MED ORDER — SODIUM CHLORIDE (PF) 0.9 % IJ SOLN
INTRAMUSCULAR | Status: AC
  Filled 2019-12-05: qty 10

## 2019-12-05 MED ORDER — METHOCARBAMOL 500 MG PO TABS
500.0000 mg | ORAL_TABLET | Freq: Four times a day (QID) | ORAL | Status: DC | PRN
  Administered 2019-12-05: 500 mg via ORAL
  Filled 2019-12-05: qty 1

## 2019-12-05 MED ORDER — PROPOFOL 10 MG/ML IV BOLUS
INTRAVENOUS | Status: DC | PRN
  Administered 2019-12-05: 30 mg via INTRAVENOUS
  Administered 2019-12-05: 150 mg via INTRAVENOUS

## 2019-12-05 MED ORDER — BUPIVACAINE HCL 0.25 % IJ SOLN
INTRAMUSCULAR | Status: AC
  Filled 2019-12-05: qty 1

## 2019-12-05 MED ORDER — DIPHENHYDRAMINE HCL 12.5 MG/5ML PO ELIX: 12.5000 mg | ORAL_SOLUTION | ORAL | Status: DC | PRN

## 2019-12-05 MED ORDER — LATANOPROST 0.005 % OP SOLN
1.0000 [drp] | Freq: Every day | OPHTHALMIC | Status: DC
  Administered 2019-12-05: 1 [drp] via OPHTHALMIC
  Filled 2019-12-05: qty 2.5

## 2019-12-05 MED ORDER — ROPIVACAINE HCL 7.5 MG/ML IJ SOLN
INTRAMUSCULAR | Status: DC | PRN
  Administered 2019-12-05: 20 mL via PERINEURAL

## 2019-12-05 MED ORDER — IRRISEPT - 450ML BOTTLE WITH 0.05% CHG IN STERILE WATER, USP 99.95% OPTIME
TOPICAL | Status: DC | PRN
  Administered 2019-12-05: 09:00:00 250 mL

## 2019-12-05 MED ORDER — ONDANSETRON HCL 4 MG/2ML IJ SOLN: 4.0000 mg | Freq: Four times a day (QID) | INTRAMUSCULAR | Status: DC | PRN

## 2019-12-05 MED ORDER — EPHEDRINE SULFATE-NACL 50-0.9 MG/10ML-% IV SOSY
PREFILLED_SYRINGE | INTRAVENOUS | Status: DC | PRN
  Administered 2019-12-05 (×2): 5 mg via INTRAVENOUS

## 2019-12-05 MED ORDER — SCOPOLAMINE 1 MG/3DAYS TD PT72
1.0000 | MEDICATED_PATCH | TRANSDERMAL | Status: DC
  Administered 2019-12-05: 1.5 mg via TRANSDERMAL
  Filled 2019-12-05: qty 1

## 2019-12-05 MED ORDER — MIDAZOLAM HCL 5 MG/5ML IJ SOLN
INTRAMUSCULAR | Status: DC | PRN
  Administered 2019-12-05: 1 mg via INTRAVENOUS

## 2019-12-05 SURGICAL SUPPLY — 67 items
ADAPTER BOLT FEMORAL +2/-2 (Knees) ×3 IMPLANT
AUGMENT DIST PFC SIGMA 8MM RT (Knees) ×2 IMPLANT
AUGMENT FMRL POST PFC 4MM SZ5 (Orthopedic Implant) ×2 IMPLANT
BAG ZIPLOCK 12X15 (MISCELLANEOUS) ×3 IMPLANT
BLADE SAG 18X100X1.27 (BLADE) ×3 IMPLANT
BLADE SAW SGTL 11.0X1.19X90.0M (BLADE) ×3 IMPLANT
BNDG ELASTIC 6X5.8 VLCR STR LF (GAUZE/BANDAGES/DRESSINGS) ×3 IMPLANT
BONE CEMENT GENTAMICIN (Cement) ×9 IMPLANT
CEMENT BONE GENTAMICIN 40 (Cement) ×3 IMPLANT
CEMENT RESTRICTOR DEPUY SZ 6 (Cement) ×3 IMPLANT
COVER SURGICAL LIGHT HANDLE (MISCELLANEOUS) ×3 IMPLANT
COVER WAND RF STERILE (DRAPES) IMPLANT
CUFF TOURN SGL QUICK 34 (TOURNIQUET CUFF) ×2
CUFF TRNQT CYL 34X4.125X (TOURNIQUET CUFF) ×1 IMPLANT
DIS AUG PFC SIGMA 8MM RIGHT (Knees) ×6 IMPLANT
DRAPE U-SHAPE 47X51 STRL (DRAPES) ×3 IMPLANT
DRSG ADAPTIC 3X8 NADH LF (GAUZE/BANDAGES/DRESSINGS) ×3 IMPLANT
DRSG PAD ABDOMINAL 8X10 ST (GAUZE/BANDAGES/DRESSINGS) ×3 IMPLANT
DURAPREP 26ML APPLICATOR (WOUND CARE) ×3 IMPLANT
ELECT REM PT RETURN 15FT ADLT (MISCELLANEOUS) ×3 IMPLANT
EVACUATOR 1/8 PVC DRAIN (DRAIN) ×3 IMPLANT
FEM POST AUG PFC 4MM SZ5 (Orthopedic Implant) ×6 IMPLANT
FEM TC3 PFC SIGMA SZ5 (Orthopedic Implant) ×3 IMPLANT
FEMORAL ADAPTER (Orthopedic Implant) ×3 IMPLANT
FEMORAL TC3 PFC SIGMA SZ5 (Orthopedic Implant) ×1 IMPLANT
GAUZE SPONGE 4X4 12PLY STRL (GAUZE/BANDAGES/DRESSINGS) ×3 IMPLANT
GLOVE BIO SURGEON STRL SZ7.5 (GLOVE) ×3 IMPLANT
GLOVE BIO SURGEON STRL SZ8 (GLOVE) ×3 IMPLANT
GLOVE BIOGEL PI IND STRL 7.0 (GLOVE) ×1 IMPLANT
GLOVE BIOGEL PI IND STRL 8 (GLOVE) ×3 IMPLANT
GLOVE BIOGEL PI INDICATOR 7.0 (GLOVE) ×2
GLOVE BIOGEL PI INDICATOR 8 (GLOVE) ×6
GLOVE SURG SS PI 7.0 STRL IVOR (GLOVE) ×3 IMPLANT
GOWN STRL REUS W/TWL LRG LVL3 (GOWN DISPOSABLE) ×9 IMPLANT
HANDPIECE INTERPULSE COAX TIP (DISPOSABLE) ×2
IMMOBILIZER KNEE 20 (SOFTGOODS) ×3
IMMOBILIZER KNEE 20 THIGH 36 (SOFTGOODS) ×1 IMPLANT
INSERT TIB TC3 RP SZ5 20 ×3 IMPLANT
JET LAVAGE IRRISEPT WOUND (IRRIGATION / IRRIGATOR) ×3
KIT TURNOVER KIT A (KITS) IMPLANT
LAVAGE JET IRRISEPT WOUND (IRRIGATION / IRRIGATOR) ×1 IMPLANT
MANIFOLD NEPTUNE II (INSTRUMENTS) ×3 IMPLANT
NS IRRIG 1000ML POUR BTL (IV SOLUTION) ×3 IMPLANT
PADDING CAST COTTON 6X4 STRL (CAST SUPPLIES) ×6 IMPLANT
PATELLA DOME PFC 41MM (Knees) ×3 IMPLANT
PENCIL SMOKE EVACUATOR (MISCELLANEOUS) ×3 IMPLANT
PROTECTOR NERVE ULNAR (MISCELLANEOUS) ×3 IMPLANT
SET HNDPC FAN SPRY TIP SCT (DISPOSABLE) ×1 IMPLANT
STAPLER VISISTAT 35W (STAPLE) ×3 IMPLANT
STEM FLUTED UNIV REV 75X20 (Stem) ×3 IMPLANT
STEM TIBIA PFC 13X30MM (Stem) ×3 IMPLANT
SUT PDS AB 1 CT1 27 (SUTURE) ×9 IMPLANT
SUT STRATAFIX 0 PDS 27 VIOLET (SUTURE) ×3
SUT VIC AB 2-0 CT1 27 (SUTURE) ×6
SUT VIC AB 2-0 CT1 TAPERPNT 27 (SUTURE) ×3 IMPLANT
SUTURE STRATFX 0 PDS 27 VIOLET (SUTURE) ×1 IMPLANT
SWAB COLLECTION DEVICE MRSA (MISCELLANEOUS) ×3 IMPLANT
SWAB CULTURE ESWAB REG 1ML (MISCELLANEOUS) ×3 IMPLANT
SYR 50ML LL SCALE MARK (SYRINGE) ×3 IMPLANT
TOWER CARTRIDGE SMART MIX (DISPOSABLE) ×3 IMPLANT
TRAY FOLEY MTR SLVR 16FR STAT (SET/KITS/TRAYS/PACK) ×3 IMPLANT
TRAY SLEEVE CEM ML (Knees) ×3 IMPLANT
TRAY TIB SZ 5 REVISION (Knees) ×3 IMPLANT
WATER STERILE IRR 1000ML POUR (IV SOLUTION) ×3 IMPLANT
WEDGE SZ 5 5MM 10MM (Knees) ×6 IMPLANT
WRAP KNEE MAXI GEL POST OP (GAUZE/BANDAGES/DRESSINGS) ×3 IMPLANT
YANKAUER SUCT BULB TIP 10FT TU (MISCELLANEOUS) ×3 IMPLANT

## 2019-12-05 NOTE — Anesthesia Procedure Notes (Signed)
Procedure Name: LMA Insertion Date/Time: 12/05/2019 7:21 AM Performed by: Montel Clock, CRNA Pre-anesthesia Checklist: Patient identified, Emergency Drugs available, Suction available, Patient being monitored and Timeout performed Patient Re-evaluated:Patient Re-evaluated prior to induction Oxygen Delivery Method: Circle system utilized Preoxygenation: Pre-oxygenation with 100% oxygen Induction Type: IV induction LMA: LMA with gastric port inserted LMA Size: 5.0 Number of attempts: 1 Dental Injury: Teeth and Oropharynx as per pre-operative assessment

## 2019-12-05 NOTE — Discharge Instructions (Addendum)
Gaynelle Arabian, MD Total Joint Specialist [facility] [address], [address] [place], [place] [address] [phone]  TOTAL KNEE REIMPLANTATION POSTOPERATIVE DIRECTIONS    Knee Rehabilitation, Guidelines Following Surgery  Results after knee surgery are often greatly improved when you follow the exercise, range of motion and muscle strengthening exercises prescribed by your doctor. Safety measures are also important to protect the knee from further injury. If any of these exercises cause you to have increased pain or swelling in your knee joint, decrease the amount until you are comfortable again and slowly increase them. If you have problems or questions, call your caregiver or physical therapist for advice.   HOME CARE INSTRUCTIONS  . Remove items at home which could result in a fall. This includes throw rugs or furniture in walking pathways.  . ICE to the affected knee as much as tolerated. Icing helps control swelling. If the swelling is well controlled you will be more comfortable and rehab easier. Continue to use ice on the knee for pain and swelling from surgery. You may notice swelling that will progress down to the foot and ankle. This is normal after surgery. Elevate the leg when you are not up walking on it.    . Continue to use the breathing machine which will help keep your temperature down. It is common for your temperature to cycle up and down following surgery, especially at night when you are not up moving around and exerting yourself. The breathing machine keeps your lungs expanded and your temperature down. . Do not place pillow under the operative knee, focus on keeping the knee straight while resting  DIET You may resume your previous home diet once you are discharged from the hospital.  DRESSING / [place] / SHOWERING . Remove the bulky dressing 2 days following surgery, this will include an ACE wrap and rolled gauze. Leave the tape strips across  the incision in place until your first follow-up appointment. Cover the incision with 4x4 gauze and paper tape. Change the dressing daily with new gauze and paper tape for 7-10 days following surgery. . You may begin showering 3 days following surgery, but do not submerge the incision under water. . Allow water and soap to run over the incision, pat dry, and apply a new gauze dressing  ACTIVITY For the first 5 days, the key is rest and control of pain and swelling . Do your home exercises twice a day starting on post-operative day 3. On the days you go to physical therapy, just do the home exercises once that day. . You should rest, ice and elevate the leg for 50 minutes out of every hour. Get up and walk/stretch for 10 minutes per hour. After 5 days you can increase your activity slowly as tolerated. . Walk with your walker as instructed. Use the walker until you are comfortable transitioning to a cane. Walk with the cane in the opposite hand of the operative leg. You may discontinue the cane once you are comfortable and walking steadily. . Avoid periods of inactivity such as sitting longer than an hour when not asleep. This helps prevent blood clots.  . You may discontinue the knee immobilizer once you are able to perform a straight leg raise while lying down. . You may resume a sexual relationship in one month or when given the OK by your doctor.  . You may return to work once you are cleared by your doctor.  . Do not drive a  car for 6 weeks or until released by you surgeon.  . Do not drive while taking narcotics.  TED HOSE STOCKINGS Wear the elastic stockings on both legs for three weeks following surgery during the day. You may remove them at night for sleeping.  WEIGHT BEARING Weight bearing as tolerated with assist device (walker, cane, etc) as directed, use it as long as suggested by your surgeon or therapist, typically at least 4-6 weeks.  POSTOPERATIVE CONSTIPATION  PROTOCOL Constipation - defined medically as fewer than three stools per week and severe constipation as less than one stool per week.  One of the most common issues patients have following surgery is constipation.  Even if you have a regular bowel pattern at home, your normal regimen is likely to be disrupted due to multiple reasons following surgery.  Combination of anesthesia, postoperative narcotics, change in appetite and fluid intake all can affect your bowels.  In order to avoid complications following surgery, here are some recommendations in order to help you during your recovery period.  . Colace (docusate) - Pick up an over-the-counter form of Colace or another stool softener and take twice a day as long as you are requiring postoperative pain medications.  Take with a full glass of water daily.  If you experience loose stools or diarrhea, hold the colace until you stool forms back up. If your symptoms do not get better within 1 week or if they get worse, check with your doctor. . Dulcolax (bisacodyl) - Pick up over-the-counter and take as directed by the product packaging as needed to assist with the movement of your bowels.  Take with a full glass of water.  Use this product as needed if not relieved by Colace only.  . MiraLax (polyethylene glycol) - Pick up over-the-counter to have on hand. MiraLax is a solution that will increase the amount of water in your bowels to assist with bowel movements.  Take as directed and can mix with a glass of water, juice, soda, coffee, or tea. Take if you go more than two days without a movement. Do not use MiraLax more than once per day. Call your doctor if you are still constipated or irregular after using this medication for 7 days in a row.  If you continue to have problems with postoperative constipation, please contact the office for further assistance and recommendations.  If you experience "the worst abdominal pain ever" or develop nausea or vomiting,  please contact the office immediatly for further recommendations for treatment.  ITCHING If you experience itching with your medications, try taking only a single pain pill, or even half a pain pill at a time.  You can also use Benadryl over the counter for itching or also to help with sleep.   MEDICATIONS See your medication summary on the "After Visit Summary" that the nursing staff will review with you prior to discharge.  You may have some home medications which will be placed on hold until you complete the course of blood thinner medication.  It is important for you to complete the blood thinner medication as prescribed by your surgeon.  Continue your approved medications as instructed at time of discharge.  PRECAUTIONS . If you experience chest pain or shortness of breath - call 911 immediately for transfer to the hospital emergency department.  . If you develop a fever greater that 101 F, purulent drainage from wound, increased redness or drainage from wound, foul odor from the wound/dressing, or calf pain - CONTACT  YOUR SURGEON.                                                   FOLLOW-UP APPOINTMENTS Make sure you keep all of your appointments after your operation with your surgeon and caregivers. You should call the office at the above phone number and make an appointment for approximately two weeks after the date of your surgery or on the date instructed by your surgeon outlined in the "After Visit Summary".  RANGE OF MOTION AND STRENGTHENING EXERCISES  Rehabilitation of the knee is important following a knee injury or an operation. After just a few days of immobilization, the muscles of the thigh which control the knee become weakened and shrink (atrophy). Knee exercises are designed to build up the tone and strength of the thigh muscles and to improve knee motion. Often times heat used for twenty to thirty minutes before working out will loosen up your tissues and help with improving the  range of motion but do not use heat for the first two weeks following surgery. These exercises can be done on a training (exercise) mat, on the floor, on a table or on a bed. Use what ever works the best and is most comfortable for you Knee exercises include:  . Leg Lifts - While your knee is still immobilized in a splint or cast, you can do straight leg raises. Lift the leg to 60 degrees, hold for 3 sec, and slowly lower the leg. Repeat 10-20 times 2-3 times daily. Perform this exercise against resistance later as your knee gets better.  Javier Docker and Hamstring Sets - Tighten up the muscle on the front of the thigh (Quad) and hold for 5-10 sec. Repeat this 10-20 times hourly. Hamstring sets are done by pushing the foot backward against an object and holding for 5-10 sec. Repeat as with quad sets.   Leg Slides: Lying on your back, slowly slide your foot toward your buttocks, bending your knee up off the floor (only go as far as is comfortable). Then slowly slide your foot back down until your leg is flat on the floor again.  Angel Wings: Lying on your back spread your legs to the side as far apart as you can without causing discomfort.  A rehabilitation program following serious knee injuries can speed recovery and prevent re-injury in the future due to weakened muscles. Contact your doctor or a physical therapist for more information on knee rehabilitation.   IF YOU ARE TRANSFERRED TO A SKILLED REHAB FACILITY If the patient is transferred to a skilled rehab facility following release from the hospital, a list of the current medications will be sent to the facility for the patient to continue.  When discharged from the skilled rehab facility, please have the facility set up the patient's [place] prior to being released. Also, the skilled facility will be responsible for providing the patient with their medications at time of release from the facility to include their pain medication, the  muscle relaxants, and their blood thinner medication. If the patient is still at the rehab facility at time of the two week follow up appointment, the skilled rehab facility will also need to assist the patient in arranging follow up appointment in our office and any transportation needs.  MAKE SURE YOU:  . Understand these instructions.  Marland Kitchen  Get help right away if you are not doing well or get worse.    Pick up stool softner and laxative for home use following surgery while on pain medications. Do not submerge incision under water. Please use good hand washing techniques while changing dressing each day. May shower starting three days after surgery. Please use a clean towel to pat the incision dry following showers. Continue to use ice for pain and swelling after surgery. Do not use any lotions or creams on the incision until instructed by your surgeon.  Information on my medicine - XARELTO (Rivaroxaban)  This medication education was reviewed with me or my healthcare representative as part of my discharge preparation.  The pharmacist that spoke with me during my hospital stay was:  Why was Xarelto prescribed for you? Xarelto was prescribed for you to reduce the risk of blood clots forming after orthopedic surgery. The medical term for these abnormal blood clots is venous thromboembolism (VTE).  What do you need to know about xarelto ? Take your Xarelto ONCE DAILY at the same time every day. You may take it either with or without food.  If you have difficulty swallowing the tablet whole, you may crush it and mix in applesauce just prior to taking your dose.  Take Xarelto exactly as prescribed by your doctor and DO NOT stop taking Xarelto without talking to the doctor who prescribed the medication.  Stopping without other VTE prevention medication to take the place of Xarelto may increase your risk of developing a clot.  After discharge, you should have regular check-up  appointments with your healthcare provider that is prescribing your Xarelto.    What do you do if you miss a dose? If you miss a dose, take it as soon as you remember on the same day then continue your regularly scheduled once daily regimen the next day. Do not take two doses of Xarelto on the same day.   Important Safety Information A possible side effect of Xarelto is bleeding. You should call your healthcare provider right away if you experience any of the following: ? Bleeding from an injury or your nose that does not stop. ? Unusual colored urine (red or dark brown) or unusual colored stools (red or black). ? Unusual bruising for unknown reasons. ? A serious fall or if you hit your head (even if there is no bleeding).  Some medicines may interact with Xarelto and might increase your risk of bleeding while on Xarelto. To help avoid this, consult your healthcare provider or pharmacist prior to using any new prescription or non-prescription medications, including herbals, vitamins, non-steroidal anti-inflammatory drugs (NSAIDs) and supplements.  This website has more information on Xarelto: [email or web address]

## 2019-12-05 NOTE — Anesthesia Postprocedure Evaluation (Signed)
Anesthesia Post Note  Patient: Pinchas Decleene  Procedure(s) Performed: REIMPLANTATION OF TOTAL KNEE (Right Knee)     Patient location during evaluation: PACU Anesthesia Type: General Level of consciousness: sedated Pain management: pain level controlled Vital Signs Assessment: post-procedure vital signs reviewed and stable Respiratory status: spontaneous breathing and respiratory function stable Cardiovascular status: stable Postop Assessment: no apparent nausea or vomiting Anesthetic complications: no    Last Vitals:  Vitals:   12/05/19 1045 12/05/19 1100  BP: (!) 163/80 (!) 163/79  Pulse: 66 64  Resp: (!) 21 20  Temp:  36.6 C  SpO2: 99% 100%    Last Pain:  Vitals:   12/05/19 1100  TempSrc:   PainSc: Asleep    LLE Motor Response: Purposeful movement (12/05/19 1100)   RLE Motor Response: Purposeful movement (12/05/19 1100)        Tatisha Cerino DANIEL

## 2019-12-05 NOTE — Op Note (Signed)
NAMEHUNTTER, OGUINN MEDICAL RECORD [id number] ACCOUNT [id number] DATE OF BIRTH:04/20/1936 FACILITY: [facility] LOCATION: [facility] PHYSICIAN:Tawna Alwin Zella Ball, MD  OPERATIVE REPORT  DATE OF PROCEDURE:  12/05/2019  PREOPERATIVE DIAGNOSIS:  resection arthroplasty, right knee, secondary to septic arthritis.  POSTOPERATIVE DIAGNOSIS:  Colon resection arthroplasty, right knee, secondary to septic arthritis.  PROCEDURE:  Right total knee arthroplasty reimplantation.  SURGEON:  Gaynelle Arabian, MD  ASSISTANT:  Theresa Duty, PA-C  ANESTHESIA:  General and adductor canal block.  ESTIMATED BLOOD LOSS:  50 mL.  DRAINS:  Hemovac x1.  COMPLICATIONS:  None.  CONDITION:  Stable to recovery.  BRIEF CLINICAL NOTE:  The patient is an 84 year old male who had a right total knee arthroplasty done a little over a year ago.  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   This was treated with resection arthroplasty and antibiotic spacer placement.  His labs have normalized after completing a course of antibiotics and his recent aspiration was negative.  He presents now for total knee arthroplasty reimplantation.  PROCEDURE IN DETAIL:  After successful administration of adductor canal block and general anesthetic, tourniquet was placed high on his right thigh.  Right lower extremity prepped and draped in the usual sterile fashion.  Extremity was wrapped in  Esmarch, tourniquet inflated to 300 mmHg.  Midline incision was made with a 10 blade through subcutaneous tissue to the level of the extensor mechanism.  A fresh blade was used to make a medial parapatellar arthrotomy.  Minimal fluid was encountered and  that which is encountered was sent for stat Gram stain, which showed rare white cells and no organisms.  A thorough synovectomy was performed and the most inflamed appearing synovial tissue was sent for frozen section,  which showed less than 4  neutrophils per high power field.  It was felt that the joint was sterile and no longer had infection.  Knee was flexed and patella everted.  The spacer was removed first by removing the tibial polyethylene and then the cement on the tibia.  Tibia subluxed forward and circumferential retraction is achieved.  The extramedullary tibial alignment guide was  placed, referencing proximally at medial aspect of the tibial tubercle and distally along the 2nd metatarsal axis and tibial crest.  Block was pinned to remove about 2 mm off the tibia.  Resection was made with oscillating saw back to normal appearing  bone.  The rest of the cement was removed from the proximal canal.  Drill was used to get into the tibial canal and then the canal was thoroughly irrigated with saline.  We reamed up to 13 mm for placement of a 13 mm cemented stem.  We then prepared the  tibia proximally for the size 5 with the modular drill and then modular drill and stem extension.  I then prepared proximally with a 29 mm sleeve broach.  The femur was then addressed.  Osteotome was used to disrupt the interface between the temporary femoral component and bone.  The component was removed with minimal to no bone loss.  Any remaining cement was removed.  We accessed the femoral canal and  then thoroughly irrigated the canal.  Reamed up to 20 mm, which had an excellent press fit.  The 20 mm reamer was left in place to serve as our intramedullary cutting guide.  Distal femoral cutting block was then placed to remove about 2 mm off the  distal femur.  Resection was made  with an oscillating saw.  Size 5 was most appropriate for the femur.  The size 5 cutting block is placed with rotation corresponding to the epicondylar axis and confirmed by creating a rectangular flexion gap at 90  degrees with the spacer blocks in place.  The block is pinned in this rotation.  Anteriorly and on the chamfers, we did not resect any  bone.  We had to go into +4 position to get any bone posteriorly.  The femoral preparation was completed with the  intercondylar cut for the TC3.  The trial tibia was placed, which is a size 5 MBT tibia with 10 mm augments medial and lateral.  A 29 sleeve and a 13 x 30 stem extension.  On the femoral side, the trial is a size 5 TC3 femur with 20 x 75 stem extension in a +2 position and 5 degrees of  valgus.  With this construct there was hyperextension even at 17.5 mm insert.  There was some lack of play in flexion too.  I felt that we could get the joint line more normalized by putting 8 mm distal augments medial and lateral and then 4 mm augments  posterior to tighten up the flexion space.  We did this on the trial and then with a 20 mm insert full extension was achieved with excellent varus/valgus and anterior/posterior balance throughout full range of motion.  Patella was everted.  There was no  patellar component in the spacer.  Thickness was 20.  I resected down to about 15 mm to a fresh bone surface.  The 41 template was placed.  Lug holes were drilled.  Trial patella was placed and tracks normally.  The trials were removed and the cut bone  surfaces were prepared with pulsatile lavage.  I also irrigated with IrriSept and left that in place for about 3 minutes before we removed the IrriSept.  The components were assembled on the back table and then the cement was mixed, which is 3 batches of  gentamicin impregnated cement.  Once it was ready for implantation, it was first injected into the tibial canal, placed on the tibial bone surface.  The tibial component is cemented into place, impacted and all extruded cement removed.  On the femoral  side, we cemented distally and had a press-fit stem.  This is also impacted and extruded cement removed.  Trial 20 mm insert was placed.  Knee held in full extension and any remaining cement was removed.  The 41 patella was cemented into place in  addition.  When  the cement was fully hardened, then the permanent 20 mm TC3 rotating platform posterior-stabilized insert was placed in the tibial tray.  The knee was reduced with outstanding stability.  Wound was copiously irrigated with IrriSept and  then the saline solution.  The arthrotomy was closed over a Hemovac drain with a running 0 Stratafix suture.  Tourniquet was released.  Total time 86 minutes.  The 2nd limb of the drain was placed subcu and then subcu closed with interrupted 2-0 Vicryl  and skin closed with staples.  Incisions were cleaned and dried and a bulky sterile dressing applied.  Drain was hooked to suction and he was placed into a knee immobilizer, awakened and transported to recovery in stable condition.  Note that a surgical assistant was of medical necessity for this procedure to do it in a safe and expeditious manner.  Surgical assistant was necessary for retraction of vital ligaments and neurovascular structures and  for proper positioning of the limb  for removal of the old implant and then safe and accurate placement of the new implant.  Please note also that prior to closing the knee, we injected a total of 20 mL of Exparel mixed with 60 mL of saline into the posterior capsule of the extensor mechanism, the periosteum of the femur, and subcutaneous tissues.  CN/NUANCE  D:12/05/2019 T:12/05/2019 JOB:[id number]

## 2019-12-05 NOTE — Anesthesia Procedure Notes (Signed)
Anesthesia Regional Block: Adductor canal block   Pre-Anesthetic Checklist: ,, timeout performed, Correct Patient, Correct Site, Correct Laterality, Correct Procedure, Correct Position, site marked, Risks and benefits discussed,  Surgical consent,  Pre-op evaluation,  At surgeon's request and post-op pain management  Laterality: Right  Prep: chloraprep       Needles:  Injection technique: Single-shot  Needle Type: Stimulator Needle - 80     Needle Length: 10cm  Needle Gauge: 21     Additional Needles:   Narrative:  Start time: 12/05/2019 6:51 AM End time: 12/05/2019 7:01 AM Injection made incrementally with aspirations every 5 mL.  Performed by: Personally

## 2019-12-05 NOTE — Transfer of Care (Signed)
Immediate Anesthesia Transfer of Care Note  Patient: Drew Hudson  Procedure(s) Performed: REIMPLANTATION OF TOTAL KNEE (Right Knee)  Patient Location: PACU  Anesthesia Type:GA combined with regional for post-op pain  Level of Consciousness: drowsy  Airway & Oxygen Therapy: Patient Spontanous Breathing and Patient connected to face mask oxygen  Post-op Assessment: Report given to RN and Post -op Vital signs reviewed and stable  Post vital signs: Reviewed and stable  Last Vitals:  Vitals Value Taken Time  BP 161/89 12/05/19 0937  Temp    Pulse 66 12/05/19 0939  Resp 12 12/05/19 0939  SpO2 100 % 12/05/19 0939  Vitals shown include unvalidated device data.  Last Pain:  Vitals:   12/05/19 0550  TempSrc:   PainSc: 0-No pain         Complications: No apparent anesthesia complications

## 2019-12-05 NOTE — Plan of Care (Signed)
  Problem: Education: Goal: Knowledge of General Education information will improve Description: Including pain rating scale, medication(s)/side effects and non-pharmacologic comfort measures Outcome: Progressing   Problem: Health Behavior/Discharge Planning: Goal: Ability to manage health-related needs will improve Outcome: Progressing   Problem: Clinical Measurements: Goal: Ability to maintain clinical measurements within normal limits will improve Outcome: Progressing Goal: Will remain free from infection Outcome: Progressing Goal: Diagnostic test results will improve Outcome: Progressing Goal: Respiratory complications will improve Outcome: Progressing Goal: Cardiovascular complication will be avoided Outcome: Progressing   Problem: Clinical Measurements: Goal: Ability to maintain clinical measurements within normal limits will improve Outcome: Progressing Goal: Will remain free from infection Outcome: Progressing Goal: Diagnostic test results will improve Outcome: Progressing Goal: Respiratory complications will improve Outcome: Progressing Goal: Cardiovascular complication will be avoided Outcome: Progressing   Problem: Activity: Goal: Risk for activity intolerance will decrease Outcome: Progressing   Problem: Nutrition: Goal: Adequate nutrition will be maintained Outcome: Progressing   Problem: Coping: Goal: Level of anxiety will decrease Outcome: Progressing   Problem: Elimination: Goal: Will not experience complications related to bowel motility Outcome: Progressing Goal: Will not experience complications related to urinary retention Outcome: Progressing   Problem: Pain Managment: Goal: General experience of comfort will improve Outcome: Progressing   Problem: Safety: Goal: Ability to remain free from injury will improve Outcome: Progressing   Problem: Skin Integrity: Goal: Risk for impaired skin integrity will decrease Outcome: Progressing    Problem: Education: Goal: Knowledge of the prescribed therapeutic regimen will improve Outcome: Progressing Goal: Individualized Educational Video(s) Outcome: Progressing   Problem: Activity: Goal: Ability to avoid complications of mobility impairment will improve Outcome: Progressing Goal: Range of joint motion will improve Outcome: Progressing   Problem: Clinical Measurements: Goal: Postoperative complications will be avoided or minimized Outcome: Progressing   Problem: Skin Integrity: Goal: Will show signs of wound healing Outcome: Progressing

## 2019-12-05 NOTE — Interval H&P Note (Signed)
History and Physical Interval Note:  12/05/2019 6:57 AM  Drew Hudson  has presented today for surgery, with the diagnosis of Resection arthroplasty right knee.  The various methods of treatment have been discussed with the patient and family. After consideration of risks, benefits and other options for treatment, the patient has consented to  Procedure(s) with comments: REIMPLANTATION OF TOTAL KNEE (Right) - 133min as a surgical intervention.  The patient's history has been reviewed, patient examined, no change in status, stable for surgery.  I have reviewed the patient's chart and labs.  Questions were answered to the patient's satisfaction.     Pilar Plate Cassey Hurrell

## 2019-12-05 NOTE — Brief Op Note (Signed)
12/05/2019  9:44 AM  PATIENT:  Lear Ng  84 y.o. male  PRE-OPERATIVE DIAGNOSIS:  Resection arthroplasty right knee  POST-OPERATIVE DIAGNOSIS:  Resection arthroplasty right knee  PROCEDURE:  Procedure(s) with comments: REIMPLANTATION OF TOTAL KNEE (Right) - 148min  SURGEON:  Surgeon(s) and Role:    Gaynelle Arabian, MD - Primary  PHYSICIAN ASSISTANT:   ASSISTANTS: Theresa Duty, PA-C   ANESTHESIA:   general and adductor canal block  EBL:  50 mL   BLOOD ADMINISTERED:none  DRAINS: (medium) Hemovact drain(s) in the right knee with  Suction Open   LOCAL MEDICATIONS USED:  OTHER exparel  COUNTS:  YES  TOURNIQUET:   Total Tourniquet Time Documented: area (Right) - 86 minutes Total: area (Right) - 86 minutes   DICTATION: .Other Dictation: Dictation Number [id number]  PLAN OF CARE: Admit to inpatient   PATIENT DISPOSITION:  PACU - hemodynamically stable.

## 2019-12-05 NOTE — Evaluation (Signed)
Physical Therapy Evaluation Patient Details Name: Drew Hudson MRN: [id number] DOB: December 28, 1935 Today's Date: 12/05/2019   History of Present Illness  Patient is 84 year old male s/p Rt reimplantation of TKA on 12/05/19 with PMH significant for Rt TKA 10/18/18, OA, cataracts, glaucoma, HTN, peripheral neuroapthy, OSA.    Clinical Impression  Drew Hudson is a 84 y.o. male POD 0 s/p Rt TKR. Patient reports modified independence with SPC for mobility at baseline. Patient is now limited by functional impairments (see PT problem list below) and requires min assist for transfers and gait with RW. Patient was able to ambulate ~70 feet with RW and min assist. Patient instructed in exercise to facilitate ROM and circulation. Patient will benefit from continued skilled PT interventions to address impairments and progress towards PLOF. Acute PT will follow to progress mobility and stair training in preparation for safe discharge home.     Follow Up Recommendations Follow surgeon's recommendation for DC plan and follow-up therapies    Equipment Recommendations  None recommended by PT    Recommendations for Other Services       Precautions / Restrictions Precautions Precautions: Fall Required Braces or Orthoses: Knee Immobilizer - Left Restrictions Weight Bearing Restrictions: No      Mobility  Bed Mobility Overal bed mobility: Needs Assistance Bed Mobility: Supine to Sit     Supine to sit: Min assist;HOB elevated     General bed mobility comments: assist required for Rt LE mobility  Transfers Overall transfer level: Needs assistance Equipment used: Rolling walker (2 wheeled) Transfers: Sit to/from Stand Sit to Stand: Min guard;From elevated surface         General transfer comment: cues for safe hand placement and technique with RW, light assist required from elevated surface to complete power up.  Ambulation/Gait Ambulation/Gait assistance: Min guard;Min assist Gait  Distance (Feet): 70 Feet Assistive device: Rolling walker (2 wheeled) Gait Pattern/deviations: Step-to pattern;Decreased stance time - right;Decreased step length - left;Decreased weight shift to right Gait velocity: decreased   General Gait Details: patient with slow gait but no overt LOB. verbal cues and assist required for RW management to negotiate obstacles such as doorway and turns  Financial trader Rankin (Stroke Patients Only)       Balance Overall balance assessment: Needs assistance Sitting-balance support: Feet supported Sitting balance-Leahy Scale: Good     Standing balance support: During functional activity;Bilateral upper extremity supported Standing balance-Leahy Scale: Poor          Pertinent Vitals/Pain Pain Assessment: 0-10 Pain Score: 4  Pain Location: Rt knee Pain Descriptors / Indicators: Aching;Sore Pain Intervention(s): Monitored during session;Limited activity within patient's tolerance;Repositioned    Home Living Family/patient expects to be discharged to:: Private residence Living Arrangements: Children Available Help at Discharge: Family;Available 24 hours/day Type of Home: House Home Access: Stairs to enter Entrance Stairs-Rails: Right;Left;Can reach both Entrance Stairs-Number of Steps: 3-4 steps Home Layout: Able to live on main level with bedroom/bathroom;One level;Laundry or work area in [place]: Kasandra Knudsen - single point;Walker - 2 wheels;Bedside commode;Shower seat - built in;Grab bars - tub/shower Additional Comments: daughter to stay with pt upon d/c home    Prior Function Level of Independence: Independent with assistive device(s)         Comments: uses SPC for mobility     Hand Dominance   Dominant Hand: Left    Extremity/Trunk Assessment   Upper Extremity  Assessment Upper Extremity Assessment: Overall WFL for tasks assessed    Lower Extremity Assessment Lower  Extremity Assessment: Generalized weakness;RLE deficits/detail RLE: Unable to fully assess due to immobilization    Cervical / Trunk Assessment Cervical / Trunk Assessment: Normal  Communication   Communication: No difficulties  Cognition Arousal/Alertness: Awake/alert Behavior During Therapy: WFL for tasks assessed/performed Overall Cognitive Status: Within Functional Limits for tasks assessed           General Comments      Exercises Total Joint Exercises Ankle Circles/Pumps: AROM;10 reps;Both;Seated Quad Sets: AROM;10 reps;Seated;Right   Assessment/Plan    PT Assessment Patient needs continued PT services  PT Problem List Decreased strength;Decreased activity tolerance;Decreased range of motion;Decreased balance;Decreased mobility;Decreased knowledge of precautions       PT Treatment Interventions      PT Goals (Current goals can be found in the Care Plan section)  Acute Rehab PT Goals Patient Stated Goal: to return home and get independent again PT Goal Formulation: With patient Time For Goal Achievement: 12/12/19 Potential to Achieve Goals: Good    Frequency 7X/week    AM-PAC PT "6 Clicks" Mobility  Outcome Measure Help needed turning from your back to your side while in a flat bed without using bedrails?: A Little Help needed moving from lying on your back to sitting on the side of a flat bed without using bedrails?: A Little Help needed moving to and from a bed to a chair (including a wheelchair)?: A Little Help needed standing up from a chair using your arms (e.g., wheelchair or bedside chair)?: A Little Help needed to walk in hospital room?: A Little Help needed climbing 3-5 steps with a railing? : A Little 6 Click Score: 18    End of Session Equipment Utilized During Treatment: Gait belt Activity Tolerance: Patient tolerated treatment well Patient left: in chair;with chair alarm set;with call bell/phone within reach Nurse Communication: Mobility  status PT Visit Diagnosis: Muscle weakness (generalized) (M62.81);Difficulty in walking, not elsewhere classified (R26.2)    Time: [id number] PT Time Calculation (min) (ACUTE ONLY): 29 min   Charges:   PT Evaluation $PT Eval Low Complexity: 1 Low PT Treatments $Gait Training: 8-22 mins        Verner Mould, DPT Physical Therapist with [facility] [phone]  12/05/2019 3:34 PM

## 2019-12-06 ENCOUNTER — Encounter: Payer: Self-pay | Admitting: *Deleted

## 2019-12-06 LAB — CBC
HCT: 32.3 % — ABNORMAL LOW (ref 39.0–52.0)
Hemoglobin: 10.6 g/dL — ABNORMAL LOW (ref 13.0–17.0)
MCH: 27.7 pg (ref 26.0–34.0)
MCHC: 32.8 g/dL (ref 30.0–36.0)
MCV: 84.6 fL (ref 80.0–100.0)
Platelets: 119 10*3/uL — ABNORMAL LOW (ref 150–400)
RBC: 3.82 MIL/uL — ABNORMAL LOW (ref 4.22–5.81)
RDW: 14.4 % (ref 11.5–15.5)
WBC: 7.2 10*3/uL (ref 4.0–10.5)
nRBC: 0 % (ref 0.0–0.2)

## 2019-12-06 LAB — BASIC METABOLIC PANEL
Anion gap: 5 (ref 5–15)
BUN: 21 mg/dL (ref 8–23)
CO2: 22 mmol/L (ref 22–32)
Calcium: 8.1 mg/dL — ABNORMAL LOW (ref 8.9–10.3)
Chloride: 110 mmol/L (ref 98–111)
Creatinine, Ser: 1.38 mg/dL — ABNORMAL HIGH (ref 0.61–1.24)
GFR calc Af Amer: 55 mL/min — ABNORMAL LOW (ref >60)
GFR calc non Af Amer: 48 mL/min — ABNORMAL LOW (ref >60)
Glucose, Bld: 118 mg/dL — ABNORMAL HIGH (ref 70–99)
Potassium: 4.3 mmol/L (ref 3.5–5.1)
Sodium: 137 mmol/L (ref 135–145)

## 2019-12-06 LAB — SURGICAL PATHOLOGY

## 2019-12-06 MED ORDER — METHOCARBAMOL 500 MG PO TABS: 500.0000 mg | ORAL_TABLET | Freq: Four times a day (QID) | ORAL | 0 refills | Status: DC | PRN

## 2019-12-06 MED ORDER — TRAMADOL HCL 50 MG PO TABS: 50.0000 mg | ORAL_TABLET | Freq: Four times a day (QID) | ORAL | 0 refills | Status: DC | PRN

## 2019-12-06 MED ORDER — GABAPENTIN 300 MG PO CAPS: 300.0000 mg | ORAL_CAPSULE | Freq: Three times a day (TID) | ORAL | 0 refills | Status: DC

## 2019-12-06 MED ORDER — OXYCODONE HCL 5 MG PO TABS: 5.0000 mg | ORAL_TABLET | Freq: Four times a day (QID) | ORAL | 0 refills | Status: DC | PRN

## 2019-12-06 NOTE — Progress Notes (Signed)
Physical Therapy Treatment Patient Details Name: Drew Hudson MRN: [id number] DOB: [birth year] Today's Date: 12/06/2019    History of Present Illness Patient is 84 year old male s/p Rt reimplantation of TKA on 12/05/19 with PMH significant for Rt TKA 10/18/18, OA, cataracts, glaucoma, HTN, peripheral neuroapthy, OSA.    PT Comments    Pt ambulated in hallway and performed LE exercises.  Pt declined handout on exercises, already has one at home and plans to f/u with next therapist.  Pt aware to perform exercises 2-3x/day.  Pt also familiar with stairs from previous surgeries so only verbalized technique.  Pt eager and ready to d/c home today.    Follow Up Recommendations  Follow surgeon's recommendation for DC plan and follow-up therapies     Equipment Recommendations  None recommended by PT    Recommendations for Other Services       Precautions / Restrictions Precautions Precautions: Fall;Knee Precaution Comments: pt educated to wear KI for mobility until able to perform SLR Required Braces or Orthoses: Knee Immobilizer - Left Knee Immobilizer - Left: Discontinue once straight leg raise with < 10 degree lag Restrictions RLE Weight Bearing: Weight bearing as tolerated    Mobility  Bed Mobility Overal bed mobility: Needs Assistance Bed Mobility: Supine to Sit     Supine to sit: Supervision        Transfers Overall transfer level: Needs assistance Equipment used: Rolling walker (2 wheeled) Transfers: Sit to/from Stand Sit to Stand: Min guard         General transfer comment: cues for safe hand placement and technique with RW  Ambulation/Gait Ambulation/Gait assistance: Min guard Gait Distance (Feet): 160 Feet Assistive device: Rolling walker (2 wheeled) Gait Pattern/deviations: Step-to pattern;Decreased stance time - right;Decreased step length - left;Antalgic Gait velocity: decreased   General Gait Details: verbal cues for RW positioning, sequence,  safety   Stairs Stairs: (pt familar from previous surgeries, able to verbalize correct technique)           Wheelchair Mobility    Modified Rankin (Stroke Patients Only)       Balance                                            Cognition Arousal/Alertness: Awake/alert Behavior During Therapy: WFL for tasks assessed/performed Overall Cognitive Status: Within Functional Limits for tasks assessed                                        Exercises Total Joint Exercises Ankle Circles/Pumps: AROM;10 reps;Both;Seated Quad Sets: AROM;10 reps;Right Heel Slides: Right;10 reps;AAROM Hip ABduction/ADduction: AROM;Right;10 reps Straight Leg Raises: AAROM;Right;10 reps Knee Flexion: AAROM;Seated;Right;10 reps    General Comments        Pertinent Vitals/Pain Pain Assessment: 0-10 Pain Score: 4  Pain Location: Rt knee Pain Descriptors / Indicators: Aching;Sore Pain Intervention(s): Limited activity within patient's tolerance;Monitored during session;Repositioned    Home Living                      Prior Function            PT Goals (current goals can now be found in the care plan section) Progress towards PT goals: Progressing toward goals    Frequency    7X/week  PT Plan Current plan remains appropriate    Co-evaluation              AM-PAC PT "6 Clicks" Mobility   Outcome Measure  Help needed turning from your back to your side while in a flat bed without using bedrails?: A Little Help needed moving from lying on your back to sitting on the side of a flat bed without using bedrails?: A Little Help needed moving to and from a bed to a chair (including a wheelchair)?: A Little Help needed standing up from a chair using your arms (e.g., wheelchair or bedside chair)?: A Little Help needed to walk in hospital room?: A Little Help needed climbing 3-5 steps with a railing? : A Little 6 Click Score: 18    End  of Session Equipment Utilized During Treatment: Gait belt Activity Tolerance: Patient tolerated treatment well Patient left: in chair;with chair alarm set;with call bell/phone within reach Nurse Communication: Mobility status PT Visit Diagnosis: Muscle weakness (generalized) (M62.81);Difficulty in walking, not elsewhere classified (R26.2)     Time: [id number] PT Time Calculation (min) (ACUTE ONLY): 16 min  Charges:  $Therapeutic Exercise: 8-22 mins                     Arlyce Dice, DPT Acute Rehabilitation Services Office: [phone]  York Ram E 12/06/2019, 1:42 PM

## 2019-12-06 NOTE — TOC Initial Note (Addendum)
Transition of Care [facility]) - Initial/Assessment Note    Patient Details  Name: Drew Hudson MRN: [id number] Date of Birth: [birth year]  Transition of Care [facility]) CM/SW Contact:    Lia Hopping, [place] Phone Number: 12/06/2019, 10:19 AM  Clinical Narrative:                 CSW met with the patient to discuss home health PT options and provided choice. CSW reached out to all Agencies in the patient zip code services area.  [facility]-will accept and start services tomorrow.  [facility]-no staffing  [facility] staffing [facility]- No staffing [facility]-Patient will have a large out of pocket cost [facility] [employer] of network with insurance [facility]-No staff available.  [facility] - will accept. 48 hour.   Discharge plan: [facility]) will accept and provide PT services. An agency Rep will contact the patient.    Expected Discharge Plan: Home/Self Care Barriers to Discharge: No Barriers Identified   Patient Goals and CMS Choice Patient states their goals for this hospitalization and ongoing recovery are:: to walk again without pain CMS Medicare.gov Compare Post Acute Care list provided to:: Patient Choice offered to / list presented to : Patient  Expected Discharge Plan and Services Expected Discharge Plan: Home/Self Care In-house Referral: Clinical Social Work Discharge Planning Services: CM Consult Post Acute Care Choice: [place] arrangements for the past 2 months: [place] Expected Discharge Date: 12/06/19                         HH Arranged: PT [place] Agency: ([place] Date [place]: 12/06/19 Time [place]: 42 Representative spoke with at [place]: [place] Arrangements/Services Living arrangements for the past 2 months: [place] with:: Self Patient language and need for interpreter reviewed:: No Do you feel safe going back to the place  where you live?: Yes      Need for Family Participation in Patient Care: Yes (Comment) Care giver support system in place?: Yes (comment) Current home services: DME Criminal Activity/Legal Involvement Pertinent to Current Situation/Hospitalization: No - Comment as needed  Activities of Daily Living Home Assistive Devices/Equipment: Cane (specify quad or straight), CBG Meter, Walker (specify type), Dentures (specify type), Eyeglasses ADL Screening (condition at time of admission) Patient's cognitive ability adequate to safely complete daily activities?: Yes Is the patient deaf or have difficulty hearing?: No Does the patient have difficulty seeing, even when wearing glasses/contacts?: No Does the patient have difficulty concentrating, remembering, or making decisions?: No Patient able to express need for assistance with ADLs?: Yes Does the patient have difficulty dressing or bathing?: No Independently performs ADLs?: Yes (appropriate for developmental age) Does the patient have difficulty walking or climbing stairs?: Yes Weakness of Legs: Right Weakness of Arms/Hands: None  Permission Sought/Granted         Permission granted to share info w AGENCY: Home Health        Emotional Assessment   Attitude/Demeanor/Rapport: Engaged Affect (typically observed): Calm Orientation: : Oriented to Place, Oriented to  Time, Oriented to Situation, Oriented to Self Alcohol / Substance Use: Not Applicable Psych Involvement: No (comment)  Admission diagnosis:  Septic arthritis of knee, right ([place]) [M00.9] Patient Active Problem List   Diagnosis Date Noted  . Septic arthritis of knee, right ([place]) 08/22/2019  . OA (osteoarthritis) of knee 10/18/2018   PCP:  Alan Ripper, [place] Pharmacy:  [id number] [place], [place] [address] [place] [address]Phone: [phone]: [phone]    Social Determinants of  Health (SDOH) Interventions    Readmission Risk Interventions No flowsheet data found.

## 2019-12-06 NOTE — Progress Notes (Signed)
RN reviewed discharge instructions with patient and daughter. All questions answered.   Paperwork and prescriptions given.   NT rolled patient down with all belongings to family car.  

## 2019-12-06 NOTE — Progress Notes (Addendum)
   Subjective: 1 Day Post-Op Procedure(s) (LRB): REIMPLANTATION OF TOTAL KNEE (Right) Patient reports pain as mild.   Patient seen in rounds by Dr. Wynelle Link. Patient is well, and has had no acute complaints or problems other than pain in the right knee. Denies chest pain or SOB. Foley catheter to be removed this AM. No issues overnight.  We will continue therapy today, ambulated 18' yesterday.  Objective: Vital signs in last 24 hours: Temp:  [97.4 F (36.3 C)-98.7 F (37.1 C)] 98.7 F (37.1 C) (02/02 0523) Pulse Rate:  [58-72] 58 (02/02 0523) Resp:  [13-25] 16 (02/02 0523) BP: (113-174)/(70-94) 113/70 (02/02 0523) SpO2:  [96 %-100 %] 98 % (02/02 0523) Weight:  [93.6 kg] 93.6 kg (02/01 1120)  Intake/Output from previous day:  Intake/Output Summary (Last 24 hours) at 12/06/2019 0716 Last data filed at 12/06/2019 0640 Gross per 24 hour  Intake 3666.04 ml  Output 2240 ml  Net 1426.04 ml    Labs: Recent Labs    12/06/19 0341  HGB 10.6*   Recent Labs    12/06/19 0341  WBC 7.2  RBC 3.82*  HCT 32.3*  PLT 119*   Recent Labs    12/06/19 0341  NA 137  K 4.3  CL 110  CO2 22  BUN 21  CREATININE 1.38*  GLUCOSE 118*  CALCIUM 8.1*   Exam: General - Patient is Alert and Oriented Extremity - Neurologically intact Neurovascular intact Sensation intact distally Dorsiflexion/Plantar flexion intact Dressing - dressing C/D/I Motor Function - intact, moving foot and toes well on exam.   Past Medical History:  Diagnosis Date  . Arthritis   . Cataract   . Chronic kidney disease    Stage 3 per pt  . Diabetes mellitus without complication (HCC)    diet controlled  . Dizziness   . Dry eyes   . Dysrhythmia   . Edema    Bilateral legs  . Fecal incontinence   . Generalized weakness   . GERD (gastroesophageal reflux disease)   . Glaucoma   . Headache   . History of chest pain    Saw cardiologist, no blockages noted, possible gas pains  . Hyperlipidemia   . Hypertension    . Idiopathic peripheral neuropathy   . Insomnia   . OSA on CPAP   . Pneumonia   . Prostate cancer ([place])   . Urinary urgency   . Vitamin D deficiency   . Wears glasses   . Wears hearing aid in both ears   . Wears partial dentures     Assessment/Plan: 1 Day Post-Op Procedure(s) (LRB): REIMPLANTATION OF TOTAL KNEE (Right) Active Problems:   Septic arthritis of knee, right (HCC)  Estimated body mass index is 27.99 kg/m as calculated from the following:   Height as of this encounter: 6' (1.829 m).   Weight as of this encounter: 93.6 kg. Advance diet Up with therapy D/C IV fluids  DVT Prophylaxis - Xarelto Weight bearing as tolerated. D/C O2 and pulse ox and try on room air. Hemovac pulled without difficulty, will continue therapy today.  Creatinine stable at baseline.  Plan is to go Home after hospital stay with HHPT. Hopeful for discharge later today if progresses with therapy and pain well controlled.  Follow-up in the office in 2 weeks.   Theresa Duty, PA-C Orthopedic Surgery 12/06/2019, 7:16 AM

## 2019-12-07 NOTE — Discharge Summary (Signed)
Physician Discharge Summary   Patient ID: Drew Hudson MRN: [id number] DOB/AGE: 1936/02/26 84 y.o.  Admit date: 12/05/2019 Discharge date: 12/06/2019  Primary Diagnosis: Resection arthroplasty, right knee, secondary to septic arthritis   Admission Diagnoses:  Past Medical History:  Diagnosis Date  . Arthritis   . Cataract   . Chronic kidney disease    Stage 3 per pt  . Diabetes mellitus without complication (HCC)    diet controlled  . Dizziness   . Dry eyes   . Dysrhythmia   . Edema    Bilateral legs  . Fecal incontinence   . Generalized weakness   . GERD (gastroesophageal reflux disease)   . Glaucoma   . Headache   . History of chest pain    Saw cardiologist, no blockages noted, possible gas pains  . Hyperlipidemia   . Hypertension   . Idiopathic peripheral neuropathy   . Insomnia   . OSA on CPAP   . Pneumonia   . Prostate cancer ([place])   . Urinary urgency   . Vitamin D deficiency   . Wears glasses   . Wears hearing aid in both ears   . Wears partial dentures    Discharge Diagnoses:   Active Problems:   Septic arthritis of knee, right (HCC)  Estimated body mass index is 27.99 kg/m as calculated from the following:   Height as of this encounter: 6' (1.829 m).   Weight as of this encounter: 93.6 kg.  Procedure:  Procedure(s) (LRB): REIMPLANTATION OF TOTAL KNEE (Right)   Consults: None  HPI: The patient is an 84 year old male who had a right total knee arthroplasty done a little over a year ago.  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 This was treated with resection arthroplasty and antibiotic spacer placement.  His labs have normalized after completing a course of antibiotics and his recent aspiration was negative.  He presents now for total knee arthroplasty reimplantation.  Laboratory Data: Admission on 12/05/2019, Discharged on 12/06/2019  Component Date Value Ref Range  Status  . Glucose-Capillary 12/05/2019 116* 70 - 99 mg/dL Final  . SURGICAL PATHOLOGY 12/05/2019    Final-Edited                   Value:SURGICAL PATHOLOGY CASE: [id number] PATIENT: Drew Hudson Surgical Pathology Report     Clinical History: Resection arthroplasty right knee ([facility])     FINAL MICROSCOPIC DIAGNOSIS:  A. SYNOVIUM, RIGHT KNEE, RESECTION ARTHROPLASTY: - Membranous fibroconnective tissue with reactive fibroblastic and vascular proliferation. - No significant neutrophilic inflammation identified.   INTRAOPERATIVE DIAGNOSIS:  A.  Right knee synovial tissue: "No increased neutrophils (less than 4/HPF)." Intraoperative diagnosis rendered by Dr. Saralyn Pilar at 8:18 AM on 12/05/2019.   GROSS DESCRIPTION:  Received fresh for intraoperative consult is a 2.8 x 1.9 x 0.8 cm aggregate of tan-pink soft tissue.  A representative portion is submitted for frozen section and re-submitted in block A1.  (AK 12/05/2019)    Final Diagnosis performed by Gillie Manners, MD.   Electronically signed 12/06/2019 Technical component performed at [facility] [address]                         ., [place], [place] [address].  Professional component performed at [employer], [place] [address], [place], [place] [address].  Immunohistochemistry Technical component (if applicable) was performed at [facility].  [address] [place], [place], [place] [address].   IMMUNOHISTOCHEMISTRY DISCLAIMER (if applicable): Some of these immunohistochemical stains may have been developed and the performance characteristics determine by [facility]. Some may not have been cleared or approved by the U.S. Food and Drug Administration. The FDA has determined that such clearance or approval is not necessary. This test is used for clinical purposes. It should not be regarded as investigational or for research. This laboratory is  certified under the [place] (CLIA-88) as qualified to perform high complexity clinical laboratory testing.  The controls stained appropriately.   Marland Kitchen Specimen Description 12/05/2019 SYNOVIAL RT KNEE   Final  . Special Requests 12/05/2019 PATIENT ON FOLLOWING ANCEF   Final  . Gram Stain 12/05/2019    Final                   Value:RARE WBC PRESENT, PREDOMINANTLY PMN NO ORGANISMS SEEN Gram Stain Report Called to,Read Back By and Verified With: DYER,C. RN @0835  ON 2.1.2021 BY [facility] Performed at [facility], [place] [address], [place], [place] [address]   . Culture 12/05/2019 NO GROWTH < 24 HOURS   Final  . Report Status 12/05/2019 PENDING   Incomplete  . Glucose-Capillary 12/05/2019 98  70 - 99 mg/dL Final  . Glucose-Capillary 12/05/2019 179* 70 - 99 mg/dL Final  . WBC 12/06/2019 7.2  4.0 - 10.5 K/uL Final  . RBC 12/06/2019 3.82* 4.22 - 5.81 MIL/uL Final  . Hemoglobin 12/06/2019 10.6* 13.0 - 17.0 g/dL Final  . HCT 12/06/2019 32.3* 39.0 - 52.0 % Final  . MCV 12/06/2019 84.6  80.0 - 100.0 fL Final  . MCH 12/06/2019 27.7  26.0 - 34.0 pg Final  . MCHC 12/06/2019 32.8  30.0 - 36.0 g/dL Final  . RDW 12/06/2019 14.4  11.5 - 15.5 % Final  . Platelets 12/06/2019 119* 150 - 400 K/uL Final   Comment: REPEATED TO VERIFY PLATELET COUNT CONFIRMED BY SMEAR SPECIMEN CHECKED FOR CLOTS Immature Platelet Fraction may be clinically indicated, consider ordering this additional test [id number]   . nRBC 12/06/2019 0.0  0.0 - 0.2 % Final   Performed at [facility] [address], [place], [place] [address]  . Sodium 12/06/2019 137  135 - 145 mmol/L Final  . Potassium 12/06/2019 4.3  3.5 - 5.1 mmol/L Final  . Chloride 12/06/2019 110  98 - 111 mmol/L Final  . CO2 12/06/2019 22  22 - 32 mmol/L Final  . Glucose, Bld 12/06/2019 118* 70 - 99 mg/dL Final  . BUN 12/06/2019 21  8 - 23 mg/dL Final  . Creatinine, Ser 12/06/2019 1.38*  0.61 - 1.24 mg/dL Final  . Calcium 12/06/2019 8.1* 8.9 - 10.3 mg/dL Final  . GFR calc non Af Amer 12/06/2019 48* >60 mL/min Final  . GFR calc Af Amer 12/06/2019 55* >60 mL/min Final  . Anion gap 12/06/2019 5  5 - 15 Final   Performed at [facility], [place] [address], [place], [place] [address]  Hospital Outpatient Visit on 12/01/2019  Component Date Value Ref Range Status  . SARS Coronavirus 2 12/01/2019 NEGATIVE  NEGATIVE Final   Comment: (NOTE) SARS-CoV-2 target nucleic acids are NOT DETECTED. The SARS-CoV-2 RNA is generally detectable in upper and lower respiratory specimens during the acute phase of infection. Negative results do not preclude SARS-CoV-2 infection, do not rule out co-infections with other pathogens, and should not be used as the sole basis  for treatment or other patient management decisions. Negative results must be combined with clinical observations, patient history, and epidemiological information. The expected result is Negative. Fact Sheet for Patients: SugarRoll.be Fact Sheet for Healthcare Providers: https://www.woods-mathews.com/ This test is not yet approved or cleared by the [place] FDA and  has been authorized for detection and/or diagnosis of SARS-CoV-2 by FDA under an Emergency Use Authorization (EUA). This EUA will remain  in effect (meaning this test can be used) for the duration of the COVID-19 declaration under Section 56                          4(b)(1) of the Act, 21 U.S.C. section 360bbb-3(b)(1), unless the authorization is terminated or revoked sooner. Performed at [facility], [place] [address], [place], [place] [address]   Hospital Outpatient Visit on 11/30/2019  Component Date Value Ref Range Status  . MRSA, PCR 11/30/2019 NEGATIVE  NEGATIVE Final  . Staphylococcus aureus 11/30/2019 NEGATIVE  NEGATIVE Final   Comment: (NOTE) The Xpert SA Assay (FDA approved for NASAL  specimens in patients 61 years of age and older), is one component of a comprehensive surveillance program. It is not intended to diagnose infection nor to guide or monitor treatment. Performed at [facility], [place] [address] [place], [place] [address]   . aPTT 11/30/2019 59* 24 - 36 seconds Final   Comment:        IF BASELINE aPTT IS ELEVATED, SUGGEST PATIENT RISK ASSESSMENT BE USED TO DETERMINE APPROPRIATE ANTICOAGULANT THERAPY. Performed at [facility], [place] [address], [place], [place] [address]   . WBC 11/30/2019 4.4  4.0 - 10.5 K/uL Final  . RBC 11/30/2019 4.85  4.22 - 5.81 MIL/uL Final  . Hemoglobin 11/30/2019 13.3  13.0 - 17.0 g/dL Final  . HCT 11/30/2019 40.9  39.0 - 52.0 % Final  . MCV 11/30/2019 84.3  80.0 - 100.0 fL Final  . MCH 11/30/2019 27.4  26.0 - 34.0 pg Final  . MCHC 11/30/2019 32.5  30.0 - 36.0 g/dL Final  . RDW 11/30/2019 14.8  11.5 - 15.5 % Final  . Platelets 11/30/2019 177  150 - 400 K/uL Final  . nRBC 11/30/2019 0.0  0.0 - 0.2 % Final   Performed at [facility], [place] [address], [place], [place] [address]  . Sodium 11/30/2019 142  135 - 145 mmol/L Final  . Potassium 11/30/2019 4.4  3.5 - 5.1 mmol/L Final  . Chloride 11/30/2019 110  98 - 111 mmol/L Final  . CO2 11/30/2019 22  22 - 32 mmol/L Final  . Glucose, Bld 11/30/2019 82  70 - 99 mg/dL Final  . BUN 11/30/2019 18  8 - 23 mg/dL Final  . Creatinine, Ser 11/30/2019 1.45* 0.61 - 1.24 mg/dL Final  . Calcium 11/30/2019 9.3  8.9 - 10.3 mg/dL Final  . Total Protein 11/30/2019 7.2  6.5 - 8.1 g/dL Final  . Albumin 11/30/2019 3.7  3.5 - 5.0 g/dL Final  . AST 11/30/2019 16  15 - 41 U/L Final  . ALT 11/30/2019 13  0 - 44 U/L Final  . Alkaline Phosphatase 11/30/2019 68  38 - 126 U/L Final  . Total Bilirubin 11/30/2019 0.8  0.3 - 1.2 mg/dL Final  . GFR calc non Af Amer 11/30/2019 45* >60 mL/min Final  . GFR calc Af Amer 11/30/2019 52* >60 mL/min Final  .  Anion gap 11/30/2019 10  5 -  15 Final   Performed at [facility], [place] [address], [place], [place] [address]  . Prothrombin Time 11/30/2019 28.7* 11.4 - 15.2 seconds Final  . INR 11/30/2019 2.7* 0.8 - 1.2 Final   Comment: (NOTE) INR goal varies based on device and disease states. Performed at [facility], [place] [address], [place], [place] [address]   . ABO/RH(D) 11/30/2019 O POS   Final  . Antibody Screen 11/30/2019 NEG   Final  . Sample Expiration 11/30/2019 12/08/2019,2359   Final  . Extend sample reason 11/30/2019    Final                   Value:NO TRANSFUSIONS OR PREGNANCY IN THE PAST 3 MONTHS Performed at [facility] [address], [place], [place] [address]      X-Rays:No results found.  EKG: Orders placed or performed during the hospital encounter of 08/19/19  . EKG  . EKG     Hospital Course: Huxton Wand is a 84 y.o. who was admitted to [facility]. They were brought to the operating room on 12/05/2019 and underwent Procedure(s): [place].  Patient tolerated the procedure well and was later transferred to the recovery room and then to the orthopaedic floor for postoperative care. They were given PO and IV analgesics for pain control following their surgery. They were given 24 hours of postoperative antibiotics of  Anti-infectives (From admission, onward)   Start     Dose/Rate Route Frequency Ordered Stop   12/05/19 2000  vancomycin (VANCOCIN) IVPB 1000 mg/200 mL premix     1,000 mg 200 mL/hr over 60 Minutes Intravenous Every 12 hours 12/05/19 1117 12/05/19 2133   12/05/19 0600  ceFAZolin (ANCEF) IVPB 2g/100 mL premix     2 g 200 mL/hr over 30 Minutes Intravenous On call to O.R. 12/05/19 [id number] 12/05/19 0732     and started on DVT prophylaxis in the form of Xarelto.   PT and OT were ordered for total joint protocol. Discharge planning consulted to help with postop disposition and  equipment needs.  Patient had a good night on the evening of surgery. They started to get up OOB with therapy on POD #0. Pt was seen during rounds and was ready to go home pending progress with therapy. Hemovac drain was pulled without difficulty. He worked with therapy on POD #1 and was meeting his goals. Pt was discharged to home later that day in stable condition.  Diet: Diabetic diet Activity: WBAT Follow-up: in 2 weeks Disposition: Home with HHPT Discharged Condition: stable   Discharge Instructions    Call MD / Call 911   Complete by: As directed    If you experience chest pain or shortness of breath, CALL 911 and be transported to the hospital emergency room.  If you develope a fever above 101 F, pus (white drainage) or increased drainage or redness at the wound, or calf pain, call your surgeon's office.   Change dressing   Complete by: As directed    Change dressing on Wednesday, then change the dressing daily with sterile 4 x 4 inch gauze dressing and apply TED hose.   Constipation Prevention   Complete by: As directed    Drink plenty of fluids.  Prune juice may be helpful.  You may use a stool softener, such as Colace (over the counter) 100 mg twice a day.  Use MiraLax (over the counter) for constipation as needed.  Diet - low sodium heart healthy   Complete by: As directed    Do not put a pillow under the knee. Place it under the heel.   Complete by: As directed    Driving restrictions   Complete by: As directed    No driving for two weeks   TED hose   Complete by: As directed    Use stockings (TED hose) for three weeks on both leg(s).  You may remove them at night for sleeping.   Weight bearing as tolerated   Complete by: As directed      Allergies as of 12/06/2019      Reactions   Lovastatin Itching   Penicillins Rash   Has patient had a PCN reaction causing immediate rash, facial/tongue/throat swelling, SOB or lightheadedness with hypotension: No Has patient had a  PCN reaction causing severe rash involving mucus membranes or skin necrosis: No Has patient had a PCN reaction that required hospitalization: No Has patient had a PCN reaction occurring within the last 10 years: Yes If all of the above answers are "NO", then may proceed with Cephalosporin use.      Medication List    TAKE these medications   amLODipine 10 MG tablet Commonly known as: NORVASC Take 10 mg by mouth at bedtime.   atorvastatin 20 MG tablet Commonly known as: LIPITOR Take 20 mg by mouth at bedtime.   cholecalciferol 25 MCG (1000 UNIT) tablet Commonly known as: VITAMIN D3 Take 1,000 Units by mouth daily.   cyclobenzaprine 10 MG tablet Commonly known as: FLEXERIL Take 10 mg by mouth 3 (three) times daily as needed for muscle spasms.   dorzolamide-timolol 22.3-6.8 MG/ML ophthalmic solution Commonly known as: COSOPT Place 1 drop into both eyes 2 (two) times daily.   ferrous sulfate 325 (65 FE) MG EC tablet Take 325 mg by mouth daily with breakfast.   gabapentin 300 MG capsule Commonly known as: NEURONTIN Take 1 capsule (300 mg total) by mouth 3 (three) times daily. Take a 300 mg capsule three times a day for two weeks following surgery.Then take a 300 mg capsule two times a day for two weeks. Then take a 300 mg capsule once a day for two weeks. Then discontinue. Notes to patient: new medication for post-operative pain   latanoprost 0.005 % ophthalmic solution Commonly known as: XALATAN Place 1 drop into both eyes at bedtime.   lisinopril 30 MG tablet Commonly known as: ZESTRIL Take 30 mg by mouth at bedtime.   metFORMIN 500 MG tablet Commonly known as: GLUCOPHAGE Take 500 mg by mouth daily.   methocarbamol 500 MG tablet Commonly known as: ROBAXIN Take 1 tablet (500 mg total) by mouth every 6 (six) hours as needed for muscle spasms.   omeprazole 20 MG capsule Commonly known as: PRILOSEC Take 20 mg by mouth every evening.   oxyCODONE 5 MG immediate release  tablet Commonly known as: Oxy IR/ROXICODONE Take 1-2 tablets (5-10 mg total) by mouth every 6 (six) hours as needed for severe pain.   polyethylene glycol 17 g packet Commonly known as: MIRALAX / GLYCOLAX Take 17 g by mouth daily as needed for moderate constipation.   polyvinyl alcohol 1.4 % ophthalmic solution Commonly known as: LIQUIFILM TEARS Place 1 drop into both eyes 2 (two) times daily.   traMADol 50 MG tablet Commonly known as: ULTRAM Take 1-2 tablets (50-100 mg total) by mouth every 6 (six) hours as needed for moderate pain.   vitamin E 180 MG (400  UNITS) capsule Take 400 Units by mouth daily.   Xarelto 20 MG Tabs tablet Generic drug: rivaroxaban Take 20 mg by mouth daily. What changed: Another medication with the same name was removed. Continue taking this medication, and follow the directions you see here.            Discharge Care Instructions  (From admission, onward)         Start     Ordered   12/06/19 0000  Weight bearing as tolerated     12/06/19 0722   12/06/19 0000  Change dressing    Comments: Change dressing on Wednesday, then change the dressing daily with sterile 4 x 4 inch gauze dressing and apply TED hose.   12/06/19 [id number]         Follow-up Information    Gaynelle Arabian, MD. Schedule an appointment as soon as possible for a visit on 12/21/2019.   Specialty: Orthopedic Surgery Contact information: [address] [place] [address] [phone]        [facility] Follow up.   Specialty: Home Health Services Why: [phone].          Signed: Theresa Duty, PA-C Orthopedic Surgery 12/07/2019, 7:30 AM

## 2019-12-08 LAB — BODY FLUID CULTURE: Culture: NO GROWTH

## 2019-12-10 LAB — ANAEROBIC CULTURE

## 2019-12-21 ENCOUNTER — Encounter: Payer: Self-pay | Admitting: *Deleted

## 2020-02-06 ENCOUNTER — Emergency Department (HOSPITAL_BASED_OUTPATIENT_CLINIC_OR_DEPARTMENT_OTHER): Payer: Medicare HMO

## 2020-02-06 ENCOUNTER — Other Ambulatory Visit: Payer: Self-pay

## 2020-02-06 ENCOUNTER — Inpatient Hospital Stay (HOSPITAL_BASED_OUTPATIENT_CLINIC_OR_DEPARTMENT_OTHER)
Admission: EM | Admit: 2020-02-06 | Discharge: 2020-02-09 | DRG: 312 | Disposition: A | Discharge status: Home / Self Care | Payer: Medicare HMO | Attending: Internal Medicine | Admitting: Internal Medicine

## 2020-02-06 ENCOUNTER — Encounter (HOSPITAL_BASED_OUTPATIENT_CLINIC_OR_DEPARTMENT_OTHER): Payer: Self-pay | Admitting: *Deleted

## 2020-02-06 ENCOUNTER — Observation Stay (HOSPITAL_COMMUNITY): Payer: Medicare HMO

## 2020-02-06 DIAGNOSIS — G609 Hereditary and idiopathic neuropathy, unspecified: Secondary | ICD-10-CM | POA: Diagnosis not present

## 2020-02-06 DIAGNOSIS — K219 Gastro-esophageal reflux disease without esophagitis: Secondary | ICD-10-CM | POA: Diagnosis not present

## 2020-02-06 DIAGNOSIS — G4733 Obstructive sleep apnea (adult) (pediatric): Secondary | ICD-10-CM | POA: Diagnosis present

## 2020-02-06 DIAGNOSIS — H409 Unspecified glaucoma: Secondary | ICD-10-CM | POA: Diagnosis not present

## 2020-02-06 DIAGNOSIS — Z87891 Personal history of nicotine dependence: Secondary | ICD-10-CM

## 2020-02-06 DIAGNOSIS — E1142 Type 2 diabetes mellitus with diabetic polyneuropathy: Secondary | ICD-10-CM | POA: Diagnosis present

## 2020-02-06 DIAGNOSIS — Z8546 Personal history of malignant neoplasm of prostate: Secondary | ICD-10-CM | POA: Diagnosis not present

## 2020-02-06 DIAGNOSIS — Z888 Allergy status to other drugs, medicaments and biological substances status: Secondary | ICD-10-CM | POA: Diagnosis not present

## 2020-02-06 DIAGNOSIS — H269 Unspecified cataract: Secondary | ICD-10-CM | POA: Diagnosis not present

## 2020-02-06 DIAGNOSIS — R55 Syncope and collapse: Secondary | ICD-10-CM | POA: Diagnosis present

## 2020-02-06 DIAGNOSIS — E1122 Type 2 diabetes mellitus with diabetic chronic kidney disease: Secondary | ICD-10-CM | POA: Diagnosis present

## 2020-02-06 DIAGNOSIS — N1831 Chronic kidney disease, stage 3a: Secondary | ICD-10-CM | POA: Diagnosis not present

## 2020-02-06 DIAGNOSIS — Z79899 Other long term (current) drug therapy: Secondary | ICD-10-CM

## 2020-02-06 DIAGNOSIS — E1136 Type 2 diabetes mellitus with diabetic cataract: Secondary | ICD-10-CM | POA: Diagnosis present

## 2020-02-06 DIAGNOSIS — Z880 Allergy status to penicillin: Secondary | ICD-10-CM | POA: Diagnosis not present

## 2020-02-06 DIAGNOSIS — Z7901 Long term (current) use of anticoagulants: Secondary | ICD-10-CM

## 2020-02-06 DIAGNOSIS — Z20822 Contact with and (suspected) exposure to covid-19: Secondary | ICD-10-CM | POA: Diagnosis present

## 2020-02-06 DIAGNOSIS — Z7984 Long term (current) use of oral hypoglycemic drugs: Secondary | ICD-10-CM

## 2020-02-06 DIAGNOSIS — I129 Hypertensive chronic kidney disease with stage 1 through stage 4 chronic kidney disease, or unspecified chronic kidney disease: Secondary | ICD-10-CM | POA: Diagnosis present

## 2020-02-06 DIAGNOSIS — I440 Atrioventricular block, first degree: Secondary | ICD-10-CM | POA: Diagnosis not present

## 2020-02-06 DIAGNOSIS — W19XXXA Unspecified fall, initial encounter: Secondary | ICD-10-CM | POA: Diagnosis present

## 2020-02-06 DIAGNOSIS — M1990 Unspecified osteoarthritis, unspecified site: Secondary | ICD-10-CM | POA: Diagnosis not present

## 2020-02-06 DIAGNOSIS — I951 Orthostatic hypotension: Principal | ICD-10-CM | POA: Diagnosis present

## 2020-02-06 DIAGNOSIS — E785 Hyperlipidemia, unspecified: Secondary | ICD-10-CM | POA: Diagnosis present

## 2020-02-06 DIAGNOSIS — I10 Essential (primary) hypertension: Secondary | ICD-10-CM

## 2020-02-06 DIAGNOSIS — N183 Chronic kidney disease, stage 3 unspecified: Secondary | ICD-10-CM

## 2020-02-06 DIAGNOSIS — Z96651 Presence of right artificial knee joint: Secondary | ICD-10-CM | POA: Diagnosis not present

## 2020-02-06 DIAGNOSIS — Z86718 Personal history of other venous thrombosis and embolism: Secondary | ICD-10-CM | POA: Diagnosis not present

## 2020-02-06 DIAGNOSIS — R42 Dizziness and giddiness: Secondary | ICD-10-CM | POA: Diagnosis present

## 2020-02-06 LAB — BASIC METABOLIC PANEL
Anion gap: 9 (ref 5–15)
BUN: 14 mg/dL (ref 8–23)
CO2: 24 mmol/L (ref 22–32)
Calcium: 9.2 mg/dL (ref 8.9–10.3)
Chloride: 106 mmol/L (ref 98–111)
Creatinine, Ser: 1.26 mg/dL — ABNORMAL HIGH (ref 0.61–1.24)
GFR calc Af Amer: >60 mL/min (ref >60)
GFR calc non Af Amer: 53 mL/min — ABNORMAL LOW (ref >60)
Glucose, Bld: 106 mg/dL — ABNORMAL HIGH (ref 70–99)
Potassium: 4.2 mmol/L (ref 3.5–5.1)
Sodium: 139 mmol/L (ref 135–145)

## 2020-02-06 LAB — CBC WITH DIFFERENTIAL/PLATELET
Abs Immature Granulocytes: 0.01 10*3/uL (ref 0.00–0.07)
Basophils Absolute: 0 10*3/uL (ref 0.0–0.1)
Basophils Relative: 0 %
Eosinophils Absolute: 0.1 10*3/uL (ref 0.0–0.5)
Eosinophils Relative: 2 %
HCT: 41.3 % (ref 39.0–52.0)
Hemoglobin: 12.9 g/dL — ABNORMAL LOW (ref 13.0–17.0)
Immature Granulocytes: 0 %
Lymphocytes Relative: 25 %
Lymphs Abs: 1.1 10*3/uL (ref 0.7–4.0)
MCH: 26.1 pg (ref 26.0–34.0)
MCHC: 31.2 g/dL (ref 30.0–36.0)
MCV: 83.6 fL (ref 80.0–100.0)
Monocytes Absolute: 0.4 10*3/uL (ref 0.1–1.0)
Monocytes Relative: 8 %
Neutro Abs: 2.9 10*3/uL (ref 1.7–7.7)
Neutrophils Relative %: 65 %
Platelets: 194 10*3/uL (ref 150–400)
RBC: 4.94 MIL/uL (ref 4.22–5.81)
RDW: 14.8 % (ref 11.5–15.5)
WBC: 4.5 10*3/uL (ref 4.0–10.5)
nRBC: 0 % (ref 0.0–0.2)

## 2020-02-06 LAB — GLUCOSE, CAPILLARY: Glucose-Capillary: 91 mg/dL (ref 70–99)

## 2020-02-06 LAB — TROPONIN I (HIGH SENSITIVITY)
Troponin I (High Sensitivity): 4 ng/L (ref <18)
Troponin I (High Sensitivity): 4 ng/L (ref <18)

## 2020-02-06 LAB — SARS CORONAVIRUS 2 (TAT 6-24 HRS): SARS Coronavirus 2: NEGATIVE

## 2020-02-06 LAB — TSH: TSH: 1.158 u[IU]/mL (ref 0.350–4.500)

## 2020-02-06 LAB — D-DIMER, QUANTITATIVE: D-Dimer, Quant: 3.36 ug/mL-FEU — ABNORMAL HIGH (ref 0.00–0.50)

## 2020-02-06 LAB — HEMOGLOBIN A1C
Hgb A1c MFr Bld: 6 % — ABNORMAL HIGH (ref 4.8–5.6)
Mean Plasma Glucose: 125.5 mg/dL

## 2020-02-06 MED ORDER — INSULIN ASPART 100 UNIT/ML ~~LOC~~ SOLN
0.0000 [IU] | Freq: Three times a day (TID) | SUBCUTANEOUS | Status: DC
  Administered 2020-02-07: 1 [IU] via SUBCUTANEOUS

## 2020-02-06 MED ORDER — ACETAMINOPHEN 650 MG RE SUPP: 650.0000 mg | Freq: Four times a day (QID) | RECTAL | Status: DC | PRN

## 2020-02-06 MED ORDER — ACETAMINOPHEN 325 MG PO TABS
650.0000 mg | ORAL_TABLET | Freq: Four times a day (QID) | ORAL | Status: DC | PRN
  Administered 2020-02-06 – 2020-02-08 (×3): 650 mg via ORAL
  Filled 2020-02-06 (×3): qty 2

## 2020-02-06 MED ORDER — HYDRALAZINE HCL 20 MG/ML IJ SOLN: 5.0000 mg | INTRAMUSCULAR | Status: DC | PRN

## 2020-02-06 MED ORDER — FERROUS SULFATE 325 (65 FE) MG PO TABS
325.0000 mg | ORAL_TABLET | Freq: Every day | ORAL | Status: DC
  Administered 2020-02-07 – 2020-02-09 (×3): 325 mg via ORAL
  Filled 2020-02-06 (×3): qty 1

## 2020-02-06 MED ORDER — LATANOPROST 0.005 % OP SOLN
1.0000 [drp] | Freq: Every day | OPHTHALMIC | Status: DC
  Administered 2020-02-06 – 2020-02-08 (×3): 1 [drp] via OPHTHALMIC
  Filled 2020-02-06: qty 2.5

## 2020-02-06 MED ORDER — PANTOPRAZOLE SODIUM 20 MG PO TBEC
20.0000 mg | DELAYED_RELEASE_TABLET | Freq: Every day | ORAL | Status: DC
  Administered 2020-02-06 – 2020-02-09 (×4): 20 mg via ORAL
  Filled 2020-02-06 (×4): qty 1

## 2020-02-06 MED ORDER — ATORVASTATIN CALCIUM 10 MG PO TABS
20.0000 mg | ORAL_TABLET | Freq: Every day | ORAL | Status: DC
  Administered 2020-02-06 – 2020-02-08 (×3): 20 mg via ORAL
  Filled 2020-02-06 (×4): qty 2

## 2020-02-06 MED ORDER — POLYVINYL ALCOHOL 1.4 % OP SOLN
1.0000 [drp] | Freq: Two times a day (BID) | OPHTHALMIC | Status: DC
  Administered 2020-02-06 – 2020-02-09 (×6): 1 [drp] via OPHTHALMIC
  Filled 2020-02-06: qty 15

## 2020-02-06 MED ORDER — SODIUM CHLORIDE 0.9% FLUSH
3.0000 mL | Freq: Two times a day (BID) | INTRAVENOUS | Status: DC
  Administered 2020-02-06 – 2020-02-09 (×6): 3 mL via INTRAVENOUS

## 2020-02-06 MED ORDER — VITAMIN E 180 MG (400 UNIT) PO CAPS
400.0000 [IU] | ORAL_CAPSULE | Freq: Every day | ORAL | Status: DC
  Administered 2020-02-07 – 2020-02-09 (×3): 400 [IU] via ORAL
  Filled 2020-02-06 (×3): qty 1

## 2020-02-06 MED ORDER — VITAMIN D 25 MCG (1000 UNIT) PO TABS
1000.0000 [IU] | ORAL_TABLET | Freq: Every day | ORAL | Status: DC
  Administered 2020-02-07 – 2020-02-09 (×3): 1000 [IU] via ORAL
  Filled 2020-02-06 (×3): qty 1

## 2020-02-06 MED ORDER — IOHEXOL 350 MG/ML SOLN
75.0000 mL | Freq: Once | INTRAVENOUS | Status: AC | PRN
  Administered 2020-02-06: 75 mL via INTRAVENOUS

## 2020-02-06 MED ORDER — DORZOLAMIDE HCL-TIMOLOL MAL 2-0.5 % OP SOLN
1.0000 [drp] | Freq: Two times a day (BID) | OPHTHALMIC | Status: DC
  Administered 2020-02-06 – 2020-02-09 (×6): 1 [drp] via OPHTHALMIC
  Filled 2020-02-06: qty 10

## 2020-02-06 MED ORDER — ENOXAPARIN SODIUM 40 MG/0.4ML ~~LOC~~ SOLN
40.0000 mg | SUBCUTANEOUS | Status: DC
  Administered 2020-02-06 – 2020-02-08 (×3): 40 mg via SUBCUTANEOUS
  Filled 2020-02-06 (×3): qty 0.4

## 2020-02-06 MED ORDER — INSULIN ASPART 100 UNIT/ML ~~LOC~~ SOLN: 0.0000 [IU] | Freq: Every day | SUBCUTANEOUS | Status: DC

## 2020-02-06 NOTE — Progress Notes (Signed)
[facility] to [facility] transfer:  Patient with h/o prostate CA; OSA on CPAP; HTN; HLD; DM; and stage 3 CKD presenting with a fall due to acute onset of syncope.  Urinated and was walking out of the bathroom and just collapsed without prodrome.  He hit his head on the way down.  Feeling ok since.  Head CT negative, labs unremarkable.  Incidentally, has H/o RUE DVT from a PICC line for post-op knee infection; taken off Xarelto about 2 weeks ago after a negative f/u US.  Needs syncope evaluation.  Will place in observation status on telemetry.   Carlyon Shadow, M.D.

## 2020-02-06 NOTE — H&P (Signed)
History and Physical    Stefan Mcfeely [id number] DOB: 1936-05-15 DOA: 02/06/2020  PCP: Alan Ripper, PA Patient coming from: [facility]  Chief Complaint: Syncope  HPI: Drew Hudson is a 84 y.o. male with medical history significant of CKD stage III, non-insulin-dependent type 2 diabetes, arthritis, cataract, GERD, hypertension, hyperlipidemia, idiopathic peripheral neuropathy, OSA on CPAP, history of prostate cancer. Patient has a history of right upper extremity DVT from a PICC line for long-term antibiotics for a postop knee infection.  He was on Xarelto for 3 months which was recently discontinued 2 weeks ago after a follow-up ultrasound was negative for residual clot.  Patient states he was at his girlfriend's house feeling good.  He went to use the bathroom and while walking out all of a sudden passed out and fell on the ground.  Reports striking his head.  He then remembers waking up and was confused and did not even where he was.  Denies any dizziness, chest pain, or shortness of breath at the time of the fall.  He has no other complaints.  Denies fevers, chills, or recent illness.  Denies cough, nausea, vomiting, abdominal pain, diarrhea, or dysuria.  ED Course: Blood pressure elevated with systolic in the [id number] on arrival.  Remainder of vital signs stable.  Labs showing no leukocytosis.  Hemoglobin 12.9, above baseline.  Blood glucose 106.  Creatinine 1.2, at baseline.  High-sensitivity troponin negative x2.  SARS-CoV-2 PCR test pending. Chest x-ray showing no active cardiopulmonary disease. Head CT negative for acute intracranial abnormality.  Review of Systems:  All systems reviewed and apart from history of presenting illness, are negative.  Past Medical History:  Diagnosis Date  . Arthritis   . Cataract   . Chronic kidney disease    Stage 3 per pt  . Diabetes mellitus without complication (HCC)    diet controlled  . Dizziness   . Dry eyes   .  Dysrhythmia   . Edema    Bilateral legs  . Fecal incontinence   . Generalized weakness   . GERD (gastroesophageal reflux disease)   . Glaucoma   . Headache   . History of chest pain    Saw cardiologist, no blockages noted, possible gas pains  . Hyperlipidemia   . Hypertension   . Idiopathic peripheral neuropathy   . Insomnia   . OSA on CPAP   . Pneumonia   . Prostate cancer ([place])   . Urinary urgency   . Vitamin D deficiency   . Wears glasses   . Wears hearing aid in both ears   . Wears partial dentures     Past Surgical History:  Procedure Laterality Date  . ANAL RECTAL MANOMETRY  10/08/2018  . ANKLE SURGERY    . BIOPSY THYROID Left 04/08/2015  . CARDIAC CATHETERIZATION    . COLONOSCOPY    . CYSTOSCOPY    . EPIDIDYMIS SURGERY    . EXCISIONAL TOTAL KNEE ARTHROPLASTY WITH ANTIBIOTIC SPACERS Right 08/22/2019   Procedure: Right knee resection arthroplasty; antibiotic spacer;  Surgeon: Gaynelle Arabian, MD;  Location: [facility];  Service: Orthopedics;  Laterality: Right;  80min  . HEMORRHOID SURGERY    . PENILE PROSTHESIS IMPLANT    . PROSTATECTOMY    . REIMPLANTATION OF TOTAL KNEE Right 12/05/2019   Procedure: REIMPLANTATION OF TOTAL KNEE;  Surgeon: Gaynelle Arabian, MD;  Location: [facility];  Service: Orthopedics;  Laterality: Right;  14min  . TOTAL KNEE ARTHROPLASTY Right 10/18/2018   Procedure: RIGHT  TOTAL KNEE ARTHROPLASTY;  Surgeon: Gaynelle Arabian, MD;  Location: [facility];  Service: Orthopedics;  Laterality: Right;  73min     reports that he has quit smoking. He has never used smokeless tobacco. He reports that he does not drink alcohol or use drugs.  Allergies  Allergen Reactions  . Lovastatin Itching  . Penicillins Rash    Has patient had a PCN reaction causing immediate rash, facial/tongue/throat swelling, SOB or lightheadedness with hypotension: No Has patient had a PCN reaction causing severe rash involving mucus membranes or skin necrosis: No Has patient had a PCN  reaction that required hospitalization: No Has patient had a PCN reaction occurring within the last 10 years: Yes If all of the above answers are "NO", then may proceed with Cephalosporin use.     History reviewed. No pertinent family history.  Prior to Admission medications   Medication Sig Start Date End Date Taking? Authorizing Provider  amLODipine (NORVASC) 10 MG tablet Take 10 mg by mouth at bedtime.  10/04/18  Yes [provider]  atorvastatin (LIPITOR) 20 MG tablet Take 20 mg by mouth at bedtime.  07/14/18  Yes [provider]  cholecalciferol (VITAMIN D3) 25 MCG (1000 UNIT) tablet Take 1,000 Units by mouth daily.   Yes [provider]  dorzolamide-timolol (COSOPT) 22.3-6.8 MG/ML ophthalmic solution Place 1 drop into both eyes 2 (two) times daily. 09/28/18  Yes [provider]  ferrous sulfate 325 (65 FE) MG EC tablet Take 325 mg by mouth daily with breakfast.   Yes [provider]  latanoprost (XALATAN) 0.005 % ophthalmic solution Place 1 drop into both eyes at bedtime.  10/05/18  Yes [provider]  lisinopril (ZESTRIL) 30 MG tablet Take 30 mg by mouth at bedtime.    Yes [provider]  metFORMIN (GLUCOPHAGE) 500 MG tablet Take 500 mg by mouth daily. 10/20/19  Yes [provider]  omeprazole (PRILOSEC) 20 MG capsule Take 20 mg by mouth every evening.  10/05/18  Yes [provider]  oxyCODONE (OXY IR/ROXICODONE) 5 MG immediate release tablet Take 1-2 tablets (5-10 mg total) by mouth every 6 (six) hours as needed for severe pain. 12/06/19  Yes Edmisten, Kristie L, PA  polyethylene glycol (MIRALAX / GLYCOLAX) packet Take 17 g by mouth daily as needed for moderate constipation.   Yes [provider]  polyvinyl alcohol (LIQUIFILM TEARS) 1.4 % ophthalmic solution Place 1 drop into both eyes 2 (two) times daily.    Yes [provider]  traMADol (ULTRAM) 50 MG tablet Take 1-2 tablets (50-100 mg  total) by mouth every 6 (six) hours as needed for moderate pain. 12/06/19  Yes Edmisten, Kristie L, PA  vitamin E 180 MG (400 UNITS) capsule Take 400 Units by mouth daily.   Yes [provider]  gabapentin (NEURONTIN) 300 MG capsule Take 1 capsule (300 mg total) by mouth 3 (three) times daily. Take a 300 mg capsule three times a day for two weeks following surgery.Then take a 300 mg capsule two times a day for two weeks. Then take a 300 mg capsule once a day for two weeks. Then discontinue. Patient not taking: Reported on 02/06/2020 12/06/19   Derl Barrow, PA  methocarbamol (ROBAXIN) 500 MG tablet Take 1 tablet (500 mg total) by mouth every 6 (six) hours as needed for muscle spasms. Patient not taking: Reported on 02/06/2020 12/06/19   Edmisten, Kristie L, PA  XARELTO 20 MG TABS tablet Take 20 mg by mouth daily.  11/09/19   [provider]    Physical Exam: Vitals:   02/06/20 1700 02/06/20 1730 02/06/20 1747 02/06/20 2024  BP: (!) 155/86 131/74 131/74 (!) 156/83  Pulse:   67 61  Resp: (!) 23 14 (!) 23 18  Temp:    97.6 F (36.4 C)  TempSrc:    Oral  SpO2: 99%  99% 100%  Weight:      Height:        Physical Exam  Constitutional: He is oriented to person, place, and time. He appears well-developed and well-nourished. No distress.  HENT:  Head: Normocephalic.  Eyes: EOM are normal. Right eye exhibits no discharge. Left eye exhibits no discharge.  Cardiovascular: Normal rate, regular rhythm and intact distal pulses.  Pulmonary/Chest: Effort normal and breath sounds normal. No respiratory distress. He has no wheezes. He has no rales.  Abdominal: Soft. Bowel sounds are normal. He exhibits no distension. There is no abdominal tenderness. There is no guarding.  Musculoskeletal:        General: No edema.     Cervical back: Neck supple.  Neurological: He is alert and oriented to person, place, and time. No cranial nerve deficit.  No focal motor or sensory deficit  Skin: Skin is  warm and dry. He is not diaphoretic.     Labs on Admission: I have personally reviewed following labs and imaging studies  CBC: Recent Labs  Lab 02/06/20 1238  WBC 4.5  NEUTROABS 2.9  HGB 12.9*  HCT 41.3  MCV 83.6  PLT Q000111Q   Basic Metabolic Panel: Recent Labs  Lab 02/06/20 1238  NA 139  K 4.2  CL 106  CO2 24  GLUCOSE 106*  BUN 14  CREATININE 1.26*  CALCIUM 9.2   GFR: Estimated Creatinine Clearance: 53.9 mL/min (A) (by C-G formula based on SCr of 1.26 mg/dL (H)). Liver Function Tests: No results for input(s): AST, ALT, ALKPHOS, BILITOT, PROT, ALBUMIN in the last 168 hours. No results for input(s): LIPASE, AMYLASE in the last 168 hours. No results for input(s): AMMONIA in the last 168 hours. Coagulation Profile: No results for input(s): INR, PROTIME in the last 168 hours. Cardiac Enzymes: No results for input(s): CKTOTAL, CKMB, CKMBINDEX, TROPONINI in the last 168 hours. BNP (last 3 results) No results for input(s): PROBNP in the last 8760 hours. HbA1C: Recent Labs    02/06/20 2125  HGBA1C 6.0*   CBG: Recent Labs  Lab 02/06/20 2041  GLUCAP 91   Lipid Profile: No results for input(s): CHOL, HDL, LDLCALC, TRIG, CHOLHDL, LDLDIRECT in the last 72 hours. Thyroid Function Tests: No results for input(s): TSH, T4TOTAL, FREET4, T3FREE, THYROIDAB in the last 72 hours. Anemia Panel: No results for input(s): VITAMINB12, FOLATE, FERRITIN, TIBC, IRON, RETICCTPCT in the last 72 hours. Urine analysis: No results found for: COLORURINE, APPEARANCEUR, LABSPEC, [place], GLUCOSEU, HGBUR, BILIRUBINUR, KETONESUR, PROTEINUR, UROBILINOGEN, NITRITE, LEUKOCYTESUR  Radiological Exams on Admission: DG Chest 2 View  Result Date: 02/06/2020 CLINICAL DATA:  Syncope.  History of prostate cancer.  Ex-smoker. EXAM: CHEST - 2 VIEW COMPARISON:  01/15/2017 from [facility]. FINDINGS: Numerous leads and wires project over the chest. Midline trachea. Normal heart size and mediastinal  contours. No pleural effusion or pneumothorax. Clear lungs. IMPRESSION: No active cardiopulmonary disease. Electronically Signed   By: Abigail Miyamoto M.D.   On: 02/06/2020 12:37   CT Head Wo Contrast  Result Date: 02/06/2020 CLINICAL DATA:  Syncope with fall.  History of prostate carcinoma EXAM: CT HEAD WITHOUT CONTRAST  TECHNIQUE: Contiguous axial images were obtained from the base of the skull through the vertex without intravenous contrast. COMPARISON:  None. FINDINGS: Brain: There is mild diffuse atrophy. There is no intracranial mass, hemorrhage, extra-axial fluid collection, or midline shift. There is patchy small vessel disease in the centra semiovale bilaterally. No acute infarct is demonstrable on this study. Vascular: No hyperdense vessel. There is calcification in the distal left vertebral artery and in both carotid siphon regions. Skull: Bony calvarium appears intact. Sinuses/Orbits: There is mucosal thickening in several ethmoid air cells. Other visualized paranasal sinuses are clear. Visualized orbits appear symmetric bilaterally. Other: Mastoid air cells are clear. IMPRESSION: Atrophy with periventricular small vessel disease. No acute infarct. No mass or hemorrhage. There are foci of arterial vascular calcification. There is mucosal thickening in several ethmoid air cells. Electronically Signed   By: Lowella Grip III M.D.   On: 02/06/2020 12:45    EKG: Independently reviewed.  Sinus rhythm.  RBBB and LAFB.  No significant change since prior tracing.  Assessment/Plan Principal Problem:   Syncope Active Problems:   Essential hypertension   CKD (chronic kidney disease), stage III   Diabetes (HCC)   OSA on CPAP   Syncope: Head CT negative for acute intracranial abnormality.  Concern for possible cardiogenic cause as there was no prodrome.  EKG without acute change.  High-sensitivity troponin negative x2.  Cardiac monitor strip showing first-degree AV block.  PE is also possibility  given elevated D-dimer and history of DVT/ anticoagulation being stopped 2 weeks ago. Plan is to continue cardiac monitoring.  Order stat CT angiogram to rule out PE.  Order additional work-up including echocardiogram, orthostatic vitals, EEG, carotid Dopplers, and check TSH level.  Hypertension: Most recent systolic in the Q000111Q.  Hold home antihypertensives at this time as orthostatics are pending.  Order hydralazine PRN SBP >170.  CKD stage III: Creatinine 1.2, at baseline.  Noninsulin-dependent type 2 diabetes: Check A1c.  Sliding scale insulin sensitive ACHS and CBG checks.  OSA: Continue CPAP at night  DVT prophylaxis: Lovenox Code Status: Patient wishes to be full code. Family Communication: No family available at this time. Disposition Plan: Anticipate discharge in 1 to 2 days. Consults called: None Admission status: It is my clinical opinion that referral for OBSERVATION is reasonable and necessary in this patient based on the above information provided. The aforementioned taken together are felt to place the patient at high risk for further clinical deterioration. However it is anticipated that the patient may be medically stable for discharge from the hospital within 24 to 48 hours.  The medical decision making on this patient was of high complexity and the patient is at high risk for clinical deterioration, therefore this is a level 3 visit.  Shela Leff MD [facility]  If 7PM-7AM, please contact night-coverage [email or web address]  02/06/2020, 10:20 PM

## 2020-02-06 NOTE — ED Notes (Signed)
ED Provider at bedside. 

## 2020-02-06 NOTE — ED Triage Notes (Signed)
Near syncope  States walking out of bathroom and suddenly started to fall states die not pass out completely hit a cabinet w head  Had a headache which is about gone,  Also scratch on nose

## 2020-02-06 NOTE — ED Notes (Signed)
Family updated on location and room number of admission.

## 2020-02-06 NOTE — ED Provider Notes (Signed)
[facility] EMERGENCY DEPARTMENT Provider Note  CSN: [id number] Arrival date & time: 02/06/20 1114    History Chief Complaint  Patient presents with  . Fall    HPI  Drew Hudson is a 84 y.o. male presents to the emerge department for evaluation of a syncopal episode.  The patient reports that he was in his normal state of health this morning when he got up.  He went to use the restroom, to urinate, and after washing his hands and walking out of the bathroom he had a sudden loss of consciousness.  He denies any antecedent chest pain, palpitations, nausea.  He fell onto a dresser and then hit the floor.  He is unsure how long he was unconscious however his girlfriend came to the room to check on him and he required assistance to get up.  He has since been asymptomatic and was able to ambulate from the car into the emergency department.  He had a similar episode several months ago and was eventually found to have a DVT in his right upper extremity related to a PICC line that was in place for long-term antibiotics from a infected knee replacement.  He was on Xarelto for 3 months, recently had a follow-up ultrasound that was negative for residual clot and he stopped his blood thinner about 2 weeks ago.   Past Medical History:  Diagnosis Date  . Arthritis   . Cataract   . Chronic kidney disease    Stage 3 per pt  . Diabetes mellitus without complication (HCC)    diet controlled  . Dizziness   . Dry eyes   . Dysrhythmia   . Edema    Bilateral legs  . Fecal incontinence   . Generalized weakness   . GERD (gastroesophageal reflux disease)   . Glaucoma   . Headache   . History of chest pain    Saw cardiologist, no blockages noted, possible gas pains  . Hyperlipidemia   . Hypertension   . Idiopathic peripheral neuropathy   . Insomnia   . OSA on CPAP   . Pneumonia   . Prostate cancer ([place])   . Urinary urgency   . Vitamin D deficiency   . Wears glasses   . Wears  hearing aid in both ears   . Wears partial dentures     Past Surgical History:  Procedure Laterality Date  . ANAL RECTAL MANOMETRY  10/08/2018  . ANKLE SURGERY    . BIOPSY THYROID Left 04/08/2015  . CARDIAC CATHETERIZATION    . COLONOSCOPY    . CYSTOSCOPY    . EPIDIDYMIS SURGERY    . EXCISIONAL TOTAL KNEE ARTHROPLASTY WITH ANTIBIOTIC SPACERS Right 08/22/2019   Procedure: Right knee resection arthroplasty; antibiotic spacer;  Surgeon: Gaynelle Arabian, MD;  Location: [facility];  Service: Orthopedics;  Laterality: Right;  35min  . HEMORRHOID SURGERY    . PENILE PROSTHESIS IMPLANT    . PROSTATECTOMY    . REIMPLANTATION OF TOTAL KNEE Right 12/05/2019   Procedure: REIMPLANTATION OF TOTAL KNEE;  Surgeon: Gaynelle Arabian, MD;  Location: [facility];  Service: Orthopedics;  Laterality: Right;  135min  . TOTAL KNEE ARTHROPLASTY Right 10/18/2018   Procedure: RIGHT TOTAL KNEE ARTHROPLASTY;  Surgeon: Gaynelle Arabian, MD;  Location: [facility];  Service: Orthopedics;  Laterality: Right;  70min    No family history on file.  Social History   Tobacco Use  . Smoking status: Former Research scientist (life sciences)  . Smokeless tobacco: Never Used  Substance Use  Topics  . Alcohol use: Never  . Drug use: Never     Home Medications Prior to Admission medications   Medication Sig Start Date End Date Taking? Authorizing Provider  amLODipine (NORVASC) 10 MG tablet Take 10 mg by mouth at bedtime.  10/04/18   [provider]  atorvastatin (LIPITOR) 20 MG tablet Take 20 mg by mouth at bedtime.  07/14/18   [provider]  cholecalciferol (VITAMIN D3) 25 MCG (1000 UNIT) tablet Take 1,000 Units by mouth daily.    [provider]  cyclobenzaprine (FLEXERIL) 10 MG tablet Take 10 mg by mouth 3 (three) times daily as needed for muscle spasms. 08/24/19   [provider]  dorzolamide-timolol (COSOPT) 22.3-6.8 MG/ML ophthalmic solution Place 1 drop into both eyes 2 (two) times daily. 09/28/18   [provider]  ferrous sulfate 325 (65 FE) MG EC tablet Take 325 mg by mouth daily with breakfast.    [provider]  gabapentin (NEURONTIN) 300 MG capsule Take 1 capsule (300 mg total) by mouth 3 (three) times daily. Take a 300 mg capsule three times a day for two weeks following surgery.Then take a 300 mg capsule two times a day for two weeks. Then take a 300 mg capsule once a day for two weeks. Then discontinue. 12/06/19   Edmisten, Kristie L, PA  latanoprost (XALATAN) 0.005 % ophthalmic solution Place 1 drop into both eyes at bedtime.  10/05/18   [provider]  lisinopril (ZESTRIL) 30 MG tablet Take 30 mg by mouth at bedtime.     [provider]  metFORMIN (GLUCOPHAGE) 500 MG tablet Take 500 mg by mouth daily. 10/20/19   [provider]  methocarbamol (ROBAXIN) 500 MG tablet Take 1 tablet (500 mg total) by mouth every 6 (six) hours as needed for muscle spasms. 12/06/19   Edmisten, Ok Anis, PA  omeprazole (PRILOSEC) 20 MG capsule Take 20 mg by mouth every evening.  10/05/18   [provider]  oxyCODONE (OXY IR/ROXICODONE) 5 MG immediate release tablet Take 1-2 tablets (5-10 mg total) by mouth every 6 (six) hours as needed for severe pain. 12/06/19   Edmisten, Kristie L, PA  polyethylene glycol (MIRALAX / GLYCOLAX) packet Take 17 g by mouth daily as needed for moderate constipation.    [provider]  polyvinyl alcohol (LIQUIFILM TEARS) 1.4 % ophthalmic solution Place 1 drop into both eyes 2 (two) times daily.     [provider]  traMADol (ULTRAM) 50 MG tablet Take 1-2 tablets (50-100 mg total) by mouth every 6 (six) hours as needed for moderate pain. 12/06/19   Edmisten, Kristie L, PA  vitamin E 180 MG (400 UNITS) capsule Take 400 Units by mouth daily.    [provider]  XARELTO 20 MG TABS tablet Take 20 mg by mouth daily. 11/09/19   [provider]     Allergies    Lovastatin and Penicillins   Review of Systems   Review of  Systems  Constitutional: Negative for fever.  HENT: Negative for congestion and sore throat.   Respiratory: Negative for cough and shortness of breath.   Cardiovascular: Negative for chest pain.  Gastrointestinal: Negative for abdominal pain, diarrhea, nausea and vomiting.  Genitourinary: Negative for dysuria.  Musculoskeletal: Negative for myalgias.  Skin: Negative for rash.  Neurological: Positive for syncope. Negative for headaches.  Psychiatric/Behavioral: Negative for behavioral problems.     Physical Exam BP (!) 169/75 (BP Location: Right Arm)   Pulse 67  Temp 98 F (36.7 C) (Oral)   Resp 18   Ht 5' 11.5" (1.816 m)   Wt 92.5 kg   SpO2 98%   BMI 28.06 kg/m   Physical Exam Constitutional:      Appearance: Normal appearance.  HENT:     Head: Normocephalic.     Comments: Superficial abrasion/contusion to left forehead    Nose: Nose normal.     Mouth/Throat:     Mouth: Mucous membranes are moist.  Eyes:     Extraocular Movements: Extraocular movements intact.     Conjunctiva/sclera: Conjunctivae normal.  Cardiovascular:     Rate and Rhythm: Normal rate.  Pulmonary:     Effort: Pulmonary effort is normal.     Breath sounds: Normal breath sounds.  Abdominal:     General: Abdomen is flat.     Palpations: Abdomen is soft.     Tenderness: There is no abdominal tenderness.  Musculoskeletal:        General: No swelling. Normal range of motion.     Cervical back: Neck supple.  Skin:    General: Skin is warm and dry.  Neurological:     General: No focal deficit present.     Mental Status: He is alert.  Psychiatric:        Mood and Affect: Mood normal.      ED Results / Procedures / Treatments   Labs (all labs ordered are listed, but only abnormal results are displayed) Labs Reviewed  BASIC METABOLIC PANEL - Abnormal; Notable for the following components:      Result Value   Glucose, Bld 106 (*)    Creatinine, Ser 1.26 (*)    GFR calc non Af Amer 53 (*)      All other components within normal limits  CBC WITH DIFFERENTIAL/PLATELET - Abnormal; Notable for the following components:   Hemoglobin 12.9 (*)    All other components within normal limits  SARS CORONAVIRUS 2 (TAT 6-24 HRS)  TROPONIN I (HIGH SENSITIVITY)  TROPONIN I (HIGH SENSITIVITY)    EKG EKG Interpretation  Date/Time:  Monday February 06 2020 11:31:15 EDT Ventricular Rate:  67 PR Interval:    QRS Duration: 149 QT Interval:  441 QTC Calculation: 466 R Axis:   -104 Text Interpretation: Sinus rhythm Probable left atrial enlargement RBBB and LAFB No significant change since last tracing Confirmed by Karle Starch  MD, Blandina Renaldo [phone]) on 02/06/2020 11:36:15 AM    Radiology DG Chest 2 View  Result Date: 02/06/2020 CLINICAL DATA:  Syncope.  History of prostate cancer.  Ex-smoker. EXAM: CHEST - 2 VIEW COMPARISON:  01/15/2017 from [facility]. FINDINGS: Numerous leads and wires project over the chest. Midline trachea. Normal heart size and mediastinal contours. No pleural effusion or pneumothorax. Clear lungs. IMPRESSION: No active cardiopulmonary disease. Electronically Signed   By: Abigail Miyamoto M.D.   On: 02/06/2020 12:37   CT Head Wo Contrast  Result Date: 02/06/2020 CLINICAL DATA:  Syncope with fall.  History of prostate carcinoma EXAM: CT HEAD WITHOUT CONTRAST TECHNIQUE: Contiguous axial images were obtained from the base of the skull through the vertex without intravenous contrast. COMPARISON:  None. FINDINGS: Brain: There is mild diffuse atrophy. There is no intracranial mass, hemorrhage, extra-axial fluid collection, or midline shift. There is patchy small vessel disease in the centra semiovale bilaterally. No acute infarct is demonstrable on this study. Vascular: No hyperdense vessel. There is calcification in the distal left vertebral artery and in both carotid siphon regions. Skull: Bony  calvarium appears intact. Sinuses/Orbits: There is mucosal thickening in several ethmoid air  cells. Other visualized paranasal sinuses are clear. Visualized orbits appear symmetric bilaterally. Other: Mastoid air cells are clear. IMPRESSION: Atrophy with periventricular small vessel disease. No acute infarct. No mass or hemorrhage. There are foci of arterial vascular calcification. There is mucosal thickening in several ethmoid air cells. Electronically Signed   By: Lowella Grip III M.D.   On: 02/06/2020 12:45    Procedures Procedures  Medications Ordered in the ED Medications - No data to display   ED Course  I have reviewed the triage vital signs and the nursing notes.  Pertinent labs & imaging results that were available during my care of the patient were reviewed by me and considered in my medical decision making (see chart for details).  Clinical Course as of Feb 05 1458  Mon Feb 06, 2020  1314 Patient CBC is unremarkable, his base metabolic panel shows a mild elevation in his creatinine that is at baseline.  His first troponin is negative.   [CS]  [id number] Patient's head CT and chest x-ray are unremarkable.  Images were independently reviewed.  I discussed with the patient that typical evaluation for patient who has planed syncope is admission to the hospital.  Patient is unsure if he wants to go down this route and will discuss with his significant other at bedside.   [CS]  1432 Patient has decided he will agree to admission. Hospitalist paged.    [CS]  52 Spoke with Dr. Lorin Mercy who will accept the patient as an admission.    [CS]    Clinical Course User Index [CS] Truddie Hidden, MD    MDM Rules/Calculators/A&P MDM Number of Diagnoses or Management Options Diagnosis management comments: Patient with sudden syncopal episode of unclear etiology.  Patient reports a similar episode several months ago when he was found to have a DVT in his right upper extremity however his ED visit at that time does not mention anything about a syncopal episode.  He has not had any  continued swelling no longer taking blood thinners and no symptoms of PE today.    Amount and/or Complexity of Data Reviewed Clinical lab tests: ordered and reviewed Tests in the radiology section of CPT: ordered and reviewed Decide to obtain previous medical records or to obtain history from someone other than the patient: no Obtain history from someone other than the patient: no Review and summarize past medical records: yes Independent visualization of images, tracings, or specimens: yes  Risk of Complications, Morbidity, and/or Mortality Presenting problems: high Diagnostic procedures: high Management options: high    Final Clinical Impression(s) / ED Diagnoses Final diagnoses:  Syncope, unspecified syncope type    Rx / DC Orders ED Discharge Orders    None       Truddie Hidden, MD 02/06/20 1459

## 2020-02-07 ENCOUNTER — Encounter (HOSPITAL_COMMUNITY): Payer: Self-pay | Admitting: Internal Medicine

## 2020-02-07 ENCOUNTER — Observation Stay (HOSPITAL_COMMUNITY): Payer: Medicare HMO

## 2020-02-07 ENCOUNTER — Ambulatory Visit (HOSPITAL_BASED_OUTPATIENT_CLINIC_OR_DEPARTMENT_OTHER): Payer: Medicare HMO

## 2020-02-07 ENCOUNTER — Encounter (HOSPITAL_COMMUNITY): Payer: Medicare HMO

## 2020-02-07 DIAGNOSIS — I10 Essential (primary) hypertension: Secondary | ICD-10-CM | POA: Diagnosis not present

## 2020-02-07 DIAGNOSIS — R55 Syncope and collapse: Secondary | ICD-10-CM

## 2020-02-07 DIAGNOSIS — Z9989 Dependence on other enabling machines and devices: Secondary | ICD-10-CM

## 2020-02-07 DIAGNOSIS — N1831 Chronic kidney disease, stage 3a: Secondary | ICD-10-CM | POA: Diagnosis not present

## 2020-02-07 DIAGNOSIS — G4733 Obstructive sleep apnea (adult) (pediatric): Secondary | ICD-10-CM | POA: Diagnosis not present

## 2020-02-07 LAB — GLUCOSE, CAPILLARY
Glucose-Capillary: 102 mg/dL — ABNORMAL HIGH (ref 70–99)
Glucose-Capillary: 103 mg/dL — ABNORMAL HIGH (ref 70–99)
Glucose-Capillary: 118 mg/dL — ABNORMAL HIGH (ref 70–99)
Glucose-Capillary: 124 mg/dL — ABNORMAL HIGH (ref 70–99)
Glucose-Capillary: 132 mg/dL — ABNORMAL HIGH (ref 70–99)

## 2020-02-07 LAB — ECHOCARDIOGRAM COMPLETE
Height: 71.5 in
Weight: 3245.17 oz

## 2020-02-07 MED ORDER — POLYETHYLENE GLYCOL 3350 17 G PO PACK: 17.0000 g | PACK | Freq: Every day | ORAL | Status: DC | PRN

## 2020-02-07 MED ORDER — AMLODIPINE BESYLATE 10 MG PO TABS
10.0000 mg | ORAL_TABLET | Freq: Every day | ORAL | Status: DC
  Administered 2020-02-07: 10 mg via ORAL
  Filled 2020-02-07: qty 1

## 2020-02-07 MED ORDER — LISINOPRIL 20 MG PO TABS
30.0000 mg | ORAL_TABLET | Freq: Every day | ORAL | Status: DC
  Administered 2020-02-07 – 2020-02-08 (×2): 30 mg via ORAL
  Filled 2020-02-07 (×2): qty 1

## 2020-02-07 NOTE — Care Management Obs Status (Signed)
[facility] NOTIFICATION   Patient Details  Name: Drew Hudson MRN: [id number] Date of Birth: [birth year]   Medicare Observation Status Notification Given:  Yes    Geralynn Ochs, LCSW 02/07/2020, 3:13 PM

## 2020-02-07 NOTE — Progress Notes (Signed)
Carotid artery duplex completed. Refer to "CV Proc" under chart review to view preliminary results.  02/07/2020 10:08 AM Kelby Aline., MHA, RVT, RDCS, RDMS

## 2020-02-07 NOTE — Progress Notes (Signed)
EEG complete - results pending 

## 2020-02-07 NOTE — Progress Notes (Signed)
Patient refused tp wear CPAP

## 2020-02-07 NOTE — Progress Notes (Signed)
Pt arrived to the unit at around 1900 from [facility], alert and oriented, implemented MD's orders, meal provided, call light in reach, all questions answered

## 2020-02-07 NOTE — Progress Notes (Signed)
PROGRESS NOTE    Dearon Drew Hudson  [id number] DOB: Dec 02, 1935 DOA: 02/06/2020 PCP: Alan Ripper, PA   Brief Narrative:  Drew Hudson is a 84 y.o. male with medical history significant of CKD stage III, non-insulin-dependent type 2 diabetes, arthritis, cataract, GERD, hypertension, hyperlipidemia, idiopathic peripheral neuropathy, OSA on CPAP, history of prostate cancer. Patient has a history of right upper extremity DVT from a PICC line for long-term antibiotics for a postop knee infection.  He was on Xarelto for 3 months which was recently discontinued 2 weeks ago after a follow-up ultrasound was negative for residual clot.  Patient was at his girlfriend's house feeling good.  He went to use the bathroom and while walking out all of a sudden passed out and fell on the ground.  Reports striking his head.  He then remembers waking up and was confused and did not even where he was.  Denies any dizziness, chest pain, or shortness of breath at the time of the fall.  He has no other complaints.  Denies fevers, chills, or recent illness.  Denies cough, nausea, vomiting, abdominal pain, diarrhea, or dysuria.  ED Course: Blood pressure elevated with systolic in the [id number] on arrival.  Remainder of vital signs stable.  Labs showing no leukocytosis.  Hemoglobin 12.9, above baseline.  Blood glucose 106.  Creatinine 1.2, at baseline.  High-sensitivity troponin negative x2.  SARS-CoV-2 PCR negative. Chest x-ray does not show any active cardiopulmonary disease. Head CT negative for acute intracranial abnormality.   Assessment & Plan:   Principal Problem:   Syncope Active Problems:   Essential hypertension   CKD (chronic kidney disease), stage III   Diabetes (HCC)   OSA on CPAP  Syncope: Head CT negative for acute intracranial abnormality.  Concern for possible cardiogenic cause as there was no prodrome.  EKG without acute change.  High-sensitivity troponin negative x2.  Cardiac monitor strip  showed first-degree AV block.  PE is also possibility given elevated D-dimer and history of DVT/ anticoagulation being stopped 2 weeks ago.  However, CTA did not show any evidence of pulmonary embolism, marked severity of coronary artery calcification noted. Plan is to continue cardiac monitoring. Echocardiogram, orthostatic vitals, EEG, carotid Dopplers ordered.  -TSH 1.158.  Hypertension: Most recent systolic in the [id number].  Hold home antihypertensives at this time as orthostatics are pending.  Ordered hydralazine PRN SBP >170. -Once orthostatic vitals are done and are negative will consider resuming home medications. -Continue monitor blood pressure and adjust medications as needed.  CKD stage III: Creatinine 1.26, at baseline.  Noninsulin-dependent type 2 diabetes: Hemoglobin A1c 6.  Continue Sliding scale insulin sensitive ACHS and CBG checks.  OSA: Continue CPAP at night  DVT prophylaxis: Lovenox Code Status: Patient wishes to be full code. Family Communication: No family available at this time. Disposition Plan: Anticipate discharge in 1 to 2 days. Consults called: None  Antimicrobials:   None    Subjective: Denies any major complaints at this time.  Objective: Vitals:   02/07/20 0320 02/07/20 0500 02/07/20 0800 02/07/20 1200  BP: (!) 143/73  (!) 141/79 (!) 150/81  Pulse: 60  63 63  Resp: 16   18  Temp: 98.1 F (36.7 C)  97.8 F (36.6 C)   TempSrc: Oral  Oral Oral  SpO2: 100%  99% 100%  Weight:  92 kg    Height:        Intake/Output Summary (Last 24 hours) at 02/07/2020 1811 Last data filed at 02/07/2020 1526 Gross per 24 hour  Intake 360 ml  Output 1105 ml  Net -745 ml   Filed Weights   02/06/20 1136 02/07/20 0500  Weight: 92.5 kg 92 kg    Examination: General exam: Appears calm and comfortable  Respiratory system: Clear to auscultation. Respiratory effort normal. Cardiovascular system: S1 & S2, no murmur. Gastrointestinal system: Abdomen is  nondistended, soft and nontender. No organomegaly or masses felt. Normal bowel sounds heard. Central nervous system: Alert and oriented. No focal neurological deficits. Extremities: No edema, Symmetric 5 x 5 power. Skin: No rashes, lesions or ulcers Psychiatry: Judgement and insight appear normal. Mood & affect appropriate.    Data Reviewed: I have personally reviewed following labs and imaging studies  CBC: Recent Labs  Lab 02/06/20 1238  WBC 4.5  NEUTROABS 2.9  HGB 12.9*  HCT 41.3  MCV 83.6  PLT Q000111Q   Basic Metabolic Panel: Recent Labs  Lab 02/06/20 1238  NA 139  K 4.2  CL 106  CO2 24  GLUCOSE 106*  BUN 14  CREATININE 1.26*  CALCIUM 9.2   GFR: Estimated Creatinine Clearance: 53.8 mL/min (A) (by C-G formula based on SCr of 1.26 mg/dL (H)). Liver Function Tests: No results for input(s): AST, ALT, ALKPHOS, BILITOT, PROT, ALBUMIN in the last 168 hours. No results for input(s): LIPASE, AMYLASE in the last 168 hours. No results for input(s): AMMONIA in the last 168 hours. Coagulation Profile: No results for input(s): INR, PROTIME in the last 168 hours. Cardiac Enzymes: No results for input(s): CKTOTAL, CKMB, CKMBINDEX, TROPONINI in the last 168 hours. BNP (last 3 results) No results for input(s): PROBNP in the last 8760 hours. HbA1C: Recent Labs    02/06/20 2125  HGBA1C 6.0*   CBG: Recent Labs  Lab 02/06/20 2041 02/07/20 0603 02/07/20 1211 02/07/20 1523 02/07/20 1637  GLUCAP 91 102* 103* 118* 124*   Lipid Profile: No results for input(s): CHOL, HDL, LDLCALC, TRIG, CHOLHDL, LDLDIRECT in the last 72 hours. Thyroid Function Tests: Recent Labs    02/06/20 2227  TSH 1.158   Anemia Panel: No results for input(s): VITAMINB12, FOLATE, FERRITIN, TIBC, IRON, RETICCTPCT in the last 72 hours. Sepsis Labs: No results for input(s): PROCALCITON, LATICACIDVEN in the last 168 hours.  Recent Results (from the past 240 hour(s))  SARS CORONAVIRUS 2 (TAT 6-24 HRS)  Nasopharyngeal Nasopharyngeal Swab     Status: None   Collection Time: 02/06/20  2:42 PM   Specimen: Nasopharyngeal Swab  Result Value Ref Range Status   SARS Coronavirus 2 NEGATIVE NEGATIVE Final    Comment: (NOTE) SARS-CoV-2 target nucleic acids are NOT DETECTED. The SARS-CoV-2 RNA is generally detectable in upper and lower respiratory specimens during the acute phase of infection. Negative results do not preclude SARS-CoV-2 infection, do not rule out co-infections with other pathogens, and should not be used as the sole basis for treatment or other patient management decisions. Negative results must be combined with clinical observations, patient history, and epidemiological information. The expected result is Negative. Fact Sheet for Patients: SugarRoll.be Fact Sheet for Healthcare Providers: https://www.woods-mathews.com/ This test is not yet approved or cleared by the [place] FDA and  has been authorized for detection and/or diagnosis of SARS-CoV-2 by FDA under an Emergency Use Authorization (EUA). This EUA will remain  in effect (meaning this test can be used) for the duration of the COVID-19 declaration under Section 56 4(b)(1) of the Act, 21 U.S.C. section 360bbb-3(b)(1), unless the authorization is terminated or revoked sooner. Performed at [facility], [place]  [address] [place], [place] [address]          Radiology Studies: DG Chest 2 View  Result Date: 02/06/2020 CLINICAL DATA:  Syncope.  History of prostate cancer.  Ex-smoker. EXAM: CHEST - 2 VIEW COMPARISON:  01/15/2017 from [facility]. FINDINGS: Numerous leads and wires project over the chest. Midline trachea. Normal heart size and mediastinal contours. No pleural effusion or pneumothorax. Clear lungs. IMPRESSION: No active cardiopulmonary disease. Electronically Signed   By: Abigail Miyamoto M.D.   On: 02/06/2020 12:37   CT Head Wo Contrast  Result Date:  02/06/2020 CLINICAL DATA:  Syncope with fall.  History of prostate carcinoma EXAM: CT HEAD WITHOUT CONTRAST TECHNIQUE: Contiguous axial images were obtained from the base of the skull through the vertex without intravenous contrast. COMPARISON:  None. FINDINGS: Brain: There is mild diffuse atrophy. There is no intracranial mass, hemorrhage, extra-axial fluid collection, or midline shift. There is patchy small vessel disease in the centra semiovale bilaterally. No acute infarct is demonstrable on this study. Vascular: No hyperdense vessel. There is calcification in the distal left vertebral artery and in both carotid siphon regions. Skull: Bony calvarium appears intact. Sinuses/Orbits: There is mucosal thickening in several ethmoid air cells. Other visualized paranasal sinuses are clear. Visualized orbits appear symmetric bilaterally. Other: Mastoid air cells are clear. IMPRESSION: Atrophy with periventricular small vessel disease. No acute infarct. No mass or hemorrhage. There are foci of arterial vascular calcification. There is mucosal thickening in several ethmoid air cells. Electronically Signed   By: Lowella Grip III M.D.   On: 02/06/2020 12:45   CT ANGIO CHEST PE W OR WO CONTRAST  Result Date: 02/06/2020 CLINICAL DATA:  Recent syncopal episodes. EXAM: CT ANGIOGRAPHY CHEST WITH CONTRAST TECHNIQUE: Multidetector CT imaging of the chest was performed using the standard protocol during bolus administration of intravenous contrast. Multiplanar CT image reconstructions and MIPs were obtained to evaluate the vascular anatomy. CONTRAST:  64mL OMNIPAQUE IOHEXOL 350 MG/ML SOLN COMPARISON:  None. FINDINGS: Cardiovascular: Satisfactory opacification of the pulmonary arteries to the segmental level. No evidence of pulmonary embolism. Normal heart size. No pericardial effusion. Marked severity coronary artery calcification is seen. Mediastinum/Nodes: There is mild right hilar lymphadenopathy. Lungs/Pleura: Very mild  atelectasis is seen within the bilateral lower lobes. There is no evidence of acute infiltrate, pleural effusion or pneumothorax. Upper Abdomen: Noninflamed diverticula are seen throughout visualized portion of the large bowel. Musculoskeletal: Multilevel degenerative changes seen throughout the thoracic spine. Review of the MIP images confirms the above findings. IMPRESSION: 1. No evidence of pulmonary embolism. 2. Marked severity coronary artery calcification. 3. Noninflamed diverticula seen throughout visualized portion of the large bowel. Electronically Signed   By: Virgina Norfolk M.D.   On: 02/06/2020 23:36   EEG adult  Result Date: 02/07/2020 Lora Havens, MD     02/07/2020  5:50 PM Patient Name: Thuy Isaiah MRN: [id number] Epilepsy Attending: Lora Havens Referring Physician/Provider: Dr Derrick Ravel Date: 02/07/2020 Duration: 25.17 mins Patient history: 83yo m presented with syncope. EEG to evaluate for seizure Level of alertness: awake AEDs during EEG study: None Technical aspects: This EEG study was done with scalp electrodes positioned according to the 10-20 International system of electrode placement. Electrical activity was acquired at a sampling rate of 500Hz  and reviewed with a high frequency filter of 70Hz  and a low frequency filter of 1Hz . EEG data were recorded continuously and digitally stored. DESCRIPTION: The posterior dominant rhythm consists of 9-10 Hz activity of moderate voltage (25-35  uV) seen predominantly in posterior head regions, symmetric and reactive to eye opening and eye closing. Photic driving was not seen during photic stimulation.   Hyperventilation was not performed. IMPRESSION: This study is within normal limits. No seizures or epileptiform discharges were seen throughout the recording. Lora Havens   ECHOCARDIOGRAM COMPLETE  Result Date: 02/07/2020    ECHOCARDIOGRAM REPORT   Patient Name:   QUINTEL DELFIERRO Date of Exam: 02/07/2020 Medical Rec #:   [id number]       Height:       71.5 in Accession #:    [id number]      Weight:       202.8 lb Date of Birth:  [birth year]       BSA:          2.132 m Patient Age:    32 years        BP:           143/73 mmHg Patient Gender: M               HR:           60 bpm. Exam Location:  Inpatient Procedure: 2D Echo, Cardiac Doppler and Color Doppler Indications:    R55 Syncope  History:        Patient has no prior history of Echocardiogram examinations.                 Signs/Symptoms:Dizziness/Lightheadedness, Chest Pain and                 Syncope; Risk Factors:Sleep Apnea, Former Smoker, Diabetes and                 Hypertension.  Sonographer:    Roseanna Rainbow RDCS Referring Phys: [id number] [place]  1. Left ventricular ejection fraction, by estimation, is 65 to 70%. The left ventricle has hyperdynamic function. The left ventricle has no regional wall motion abnormalities. There is moderate concentric left ventricular hypertrophy. Left ventricular diastolic parameters are consistent with Grade I diastolic dysfunction (impaired relaxation).  2. Right ventricular systolic function is normal. The right ventricular size is normal. There is mildly elevated pulmonary artery systolic pressure. The estimated right ventricular systolic pressure is 99991111 mmHg.  3. Left atrial size was mildly dilated.  4. The mitral valve is normal in structure. Mild to moderate mitral valve regurgitation.  5. The aortic valve is normal in structure. Aortic valve regurgitation is trivial. No aortic stenosis is present.  6. The inferior vena cava is dilated in size with >50% respiratory variability, suggesting right atrial pressure of 8 mmHg. FINDINGS  Left Ventricle: Left ventricular ejection fraction, by estimation, is 65 to 70%. The left ventricle has hyperdynamic function. The left ventricle has no regional wall motion abnormalities. The left ventricular internal cavity size was normal in size. There is moderate concentric left  ventricular hypertrophy. Left ventricular diastolic parameters are consistent with Grade I diastolic dysfunction (impaired relaxation). Indeterminate filling pressures. Right Ventricle: The right ventricular size is normal. No increase in right ventricular wall thickness. Right ventricular systolic function is normal. There is mildly elevated pulmonary artery systolic pressure. The tricuspid regurgitant velocity is 2.74  m/s, and with an assumed right atrial pressure of 8 mmHg, the estimated right ventricular systolic pressure is 99991111 mmHg. Left Atrium: Left atrial size was mildly dilated. Right Atrium: Right atrial size was normal in size. Pericardium: There is no evidence of pericardial effusion. Mitral Valve: The mitral valve is normal in structure.  There is mild late systolic prolapse of the middle scallop of the posterior leaflet of the mitral valve. Mild to moderate mitral valve regurgitation, with centrally-directed jet. Tricuspid Valve: The tricuspid valve is normal in structure. Tricuspid valve regurgitation is mild. Aortic Valve: The aortic valve is normal in structure. Aortic valve regurgitation is trivial. No aortic stenosis is present. Pulmonic Valve: The pulmonic valve was normal in structure. Pulmonic valve regurgitation is not visualized. Aorta: The aortic root and ascending aorta are structurally normal, with no evidence of dilitation. Venous: The inferior vena cava is dilated in size with greater than 50% respiratory variability, suggesting right atrial pressure of 8 mmHg. IAS/Shunts: No atrial level shunt detected by color flow Doppler.  LEFT VENTRICLE PLAX 2D LVIDd:         4.34 cm      Diastology LVIDs:         2.31 cm      LV e' lateral:   7.29 cm/s LV PW:         1.60 cm      LV E/e' lateral: 10.3 LV IVS:        1.46 cm      LV e' medial:    6.20 cm/s LVOT diam:     2.00 cm      LV E/e' medial:  12.1 LV SV:         79 LV SV Index:   37 LVOT Area:     3.14 cm  LV Volumes (MOD) LV vol d, MOD  A2C: 80.5 ml LV vol d, MOD A4C: 134.0 ml LV vol s, MOD A2C: 27.6 ml LV vol s, MOD A4C: 46.3 ml LV SV MOD A2C:     52.9 ml LV SV MOD A4C:     134.0 ml LV SV MOD BP:      67.5 ml RIGHT VENTRICLE            IVC RV S prime:     9.10 cm/s  IVC diam: 2.07 cm LEFT ATRIUM             Index       RIGHT ATRIUM           Index LA diam:        3.80 cm 1.78 cm/m  RA Area:     14.00 cm LA Vol (A2C):   63.5 ml 29.78 ml/m RA Volume:   29.50 ml  13.84 ml/m LA Vol (A4C):   50.5 ml 23.68 ml/m LA Biplane Vol: 56.6 ml 26.54 ml/m  AORTIC VALVE LVOT Vmax:   127.00 cm/s LVOT Vmean:  87.700 cm/s LVOT VTI:    0.252 m  AORTA Ao Root diam: 3.60 cm Ao Asc diam:  3.70 cm MITRAL VALVE                TRICUSPID VALVE MV Area (PHT): 2.45 cm     TR Peak grad:   30.0 mmHg MV Decel Time: 310 msec     TR Vmax:        274.00 cm/s MV E velocity: 75.20 cm/s MV A velocity: 101.00 cm/s  SHUNTS MV E/A ratio:  0.74         Systemic VTI:  0.25 m                             Systemic Diam: 2.00 cm Dani Gobble Croitoru MD Electronically signed by Sanda Klein MD Signature Date/Time: 02/07/2020/10:23:08  AM    Final    VAS US CAROTID  Result Date: 02/07/2020 Carotid Arterial Duplex Study Indications:  Syncope. Risk Factors: Hypertension, hyperlipidemia, Diabetes. Performing Technologist: Maudry Mayhew MHA, RDMS, RVT, RDCS  Examination Guidelines: A complete evaluation includes B-mode imaging, spectral Doppler, color Doppler, and power Doppler as needed of all accessible portions of each vessel. Bilateral testing is considered an integral part of a complete examination. Limited examinations for reoccurring indications may be performed as noted.  Right Carotid Findings: +----------+--------+-------+--------+----------------------+------------------+           PSV cm/sEDV    StenosisPlaque Description    Comments                             cm/s                                                     +----------+--------+-------+--------+----------------------+------------------+ CCA Prox  125     13                                   intimal thickening +----------+--------+-------+--------+----------------------+------------------+ CCA Distal75      11                                                      +----------+--------+-------+--------+----------------------+------------------+ ICA Prox  43      11             smooth and                                                                heterogenous                             +----------+--------+-------+--------+----------------------+------------------+ ICA Distal64      17                                                      +----------+--------+-------+--------+----------------------+------------------+ ECA       82      10                                                      +----------+--------+-------+--------+----------------------+------------------+ +----------+--------+-------+----------------+-------------------+           PSV cm/sEDV cmsDescribe        Arm Pressure (mmHG) +----------+--------+-------+----------------+-------------------+ [id number]             Multiphasic, WNL                    +----------+--------+-------+----------------+-------------------+ +---------+--------+--+--------+-+---------+  VertebralPSV cm/s35EDV cm/s8Antegrade +---------+--------+--+--------+-+---------+  Left Carotid Findings: +----------+-------+-------+--------+------------------------+-----------------+           PSV    EDV    StenosisPlaque Description      Comments                    cm/s   cm/s                                                     +----------+-------+-------+--------+------------------------+-----------------+ CCA Prox  158    21                                     intimal                                                                   thickening         +----------+-------+-------+--------+------------------------+-----------------+ CCA Distal91     13                                     intimal                                                                   thickening        +----------+-------+-------+--------+------------------------+-----------------+ ICA Prox  62     12             smooth and heterogenous                   +----------+-------+-------+--------+------------------------+-----------------+ ICA Distal46     12                                                       +----------+-------+-------+--------+------------------------+-----------------+ ECA       109    14             heterogenous and                                                          irregular                                 +----------+-------+-------+--------+------------------------+-----------------+ +----------+--------+--------+----------------+-------------------+           PSV cm/sEDV cm/sDescribe        Arm Pressure (mmHG) +----------+--------+--------+----------------+-------------------+ [id number]  Multiphasic, WNL                    +----------+--------+--------+----------------+-------------------+ +---------+--------+--+--------+--+---------+ VertebralPSV cm/s59EDV cm/s21Antegrade +---------+--------+--+--------+--+---------+   Summary: Right Carotid: Velocities in the right ICA are consistent with a 1-39% stenosis. Left Carotid: Velocities in the left ICA are consistent with a 1-39% stenosis. Vertebrals:  Bilateral vertebral arteries demonstrate antegrade flow. Subclavians: Normal flow hemodynamics were seen in bilateral subclavian              arteries. Incidental finding: multiple heterogenous areas with vascularity of the left thyroid lobe; etiology unknown. *See table(s) above for measurements and observations.  Electronically signed by Deitra Mayo MD on 02/07/2020 at 3:20:44 PM.     Final         Scheduled Meds: . atorvastatin  20 mg Oral QHS  . cholecalciferol  1,000 Units Oral Daily  . dorzolamide-timolol  1 drop Both Eyes BID  . enoxaparin (LOVENOX) injection  40 mg Subcutaneous Q24H  . ferrous sulfate  325 mg Oral Q breakfast  . insulin aspart  0-5 Units Subcutaneous QHS  . insulin aspart  0-9 Units Subcutaneous TID WC  . latanoprost  1 drop Both Eyes QHS  . pantoprazole  20 mg Oral Daily  . polyvinyl alcohol  1 drop Both Eyes BID  . sodium chloride flush  3 mL Intravenous Q12H  . vitamin E  400 Units Oral Daily   Continuous Infusions:   LOS: 0 days    Yaakov Guthrie, MD [facility]   To contact the attending provider between 7A-7P or the covering provider during after hours 7P-7A, please log into the web site [email or web address] and access using universal [place] password for that web site. If you do not have the password, please call the hospital operator.  02/07/2020, 6:11 PM

## 2020-02-07 NOTE — Procedures (Signed)
Patient Name: Drew Hudson  MRN: [id number]  Epilepsy Attending: Lora Havens  Referring Physician/Provider: Dr Derrick Ravel Date: 02/07/2020 Duration: 25.17 mins  Patient history: 83yo m presented with syncope. EEG to evaluate for seizure  Level of alertness: awake  AEDs during EEG study: None  Technical aspects: This EEG study was done with scalp electrodes positioned according to the 10-20 International system of electrode placement. Electrical activity was acquired at a sampling rate of 500Hz  and reviewed with a high frequency filter of 70Hz  and a low frequency filter of 1Hz . EEG data were recorded continuously and digitally stored.   DESCRIPTION: The posterior dominant rhythm consists of 9-10 Hz activity of moderate voltage (25-35 uV) seen predominantly in posterior head regions, symmetric and reactive to eye opening and eye closing. Photic driving was not seen during photic stimulation.   Hyperventilation was not performed.  IMPRESSION: This study is within normal limits. No seizures or epileptiform discharges were seen throughout the recording.  Drew Hudson

## 2020-02-08 ENCOUNTER — Inpatient Hospital Stay (HOSPITAL_COMMUNITY): Payer: Medicare HMO

## 2020-02-08 DIAGNOSIS — E1142 Type 2 diabetes mellitus with diabetic polyneuropathy: Secondary | ICD-10-CM | POA: Diagnosis present

## 2020-02-08 DIAGNOSIS — Z7901 Long term (current) use of anticoagulants: Secondary | ICD-10-CM | POA: Diagnosis not present

## 2020-02-08 DIAGNOSIS — I10 Essential (primary) hypertension: Secondary | ICD-10-CM | POA: Diagnosis not present

## 2020-02-08 DIAGNOSIS — Z880 Allergy status to penicillin: Secondary | ICD-10-CM | POA: Diagnosis not present

## 2020-02-08 DIAGNOSIS — Z8546 Personal history of malignant neoplasm of prostate: Secondary | ICD-10-CM | POA: Diagnosis not present

## 2020-02-08 DIAGNOSIS — I951 Orthostatic hypotension: Secondary | ICD-10-CM | POA: Diagnosis present

## 2020-02-08 DIAGNOSIS — R42 Dizziness and giddiness: Secondary | ICD-10-CM | POA: Diagnosis present

## 2020-02-08 DIAGNOSIS — R55 Syncope and collapse: Secondary | ICD-10-CM | POA: Diagnosis present

## 2020-02-08 DIAGNOSIS — Z888 Allergy status to other drugs, medicaments and biological substances status: Secondary | ICD-10-CM | POA: Diagnosis not present

## 2020-02-08 DIAGNOSIS — W19XXXA Unspecified fall, initial encounter: Secondary | ICD-10-CM | POA: Diagnosis present

## 2020-02-08 DIAGNOSIS — Z86718 Personal history of other venous thrombosis and embolism: Secondary | ICD-10-CM | POA: Diagnosis not present

## 2020-02-08 DIAGNOSIS — Z9989 Dependence on other enabling machines and devices: Secondary | ICD-10-CM | POA: Diagnosis not present

## 2020-02-08 DIAGNOSIS — I129 Hypertensive chronic kidney disease with stage 1 through stage 4 chronic kidney disease, or unspecified chronic kidney disease: Secondary | ICD-10-CM | POA: Diagnosis present

## 2020-02-08 DIAGNOSIS — Z79899 Other long term (current) drug therapy: Secondary | ICD-10-CM | POA: Diagnosis not present

## 2020-02-08 DIAGNOSIS — Z20822 Contact with and (suspected) exposure to covid-19: Secondary | ICD-10-CM | POA: Diagnosis present

## 2020-02-08 DIAGNOSIS — H409 Unspecified glaucoma: Secondary | ICD-10-CM | POA: Diagnosis present

## 2020-02-08 DIAGNOSIS — G609 Hereditary and idiopathic neuropathy, unspecified: Secondary | ICD-10-CM | POA: Diagnosis present

## 2020-02-08 DIAGNOSIS — H269 Unspecified cataract: Secondary | ICD-10-CM | POA: Diagnosis present

## 2020-02-08 DIAGNOSIS — I440 Atrioventricular block, first degree: Secondary | ICD-10-CM | POA: Diagnosis present

## 2020-02-08 DIAGNOSIS — E1136 Type 2 diabetes mellitus with diabetic cataract: Secondary | ICD-10-CM | POA: Diagnosis present

## 2020-02-08 DIAGNOSIS — K219 Gastro-esophageal reflux disease without esophagitis: Secondary | ICD-10-CM | POA: Diagnosis present

## 2020-02-08 DIAGNOSIS — E785 Hyperlipidemia, unspecified: Secondary | ICD-10-CM | POA: Diagnosis present

## 2020-02-08 DIAGNOSIS — N1831 Chronic kidney disease, stage 3a: Secondary | ICD-10-CM | POA: Diagnosis present

## 2020-02-08 DIAGNOSIS — G4733 Obstructive sleep apnea (adult) (pediatric): Secondary | ICD-10-CM | POA: Diagnosis present

## 2020-02-08 DIAGNOSIS — E1122 Type 2 diabetes mellitus with diabetic chronic kidney disease: Secondary | ICD-10-CM | POA: Diagnosis present

## 2020-02-08 DIAGNOSIS — Z96651 Presence of right artificial knee joint: Secondary | ICD-10-CM | POA: Diagnosis present

## 2020-02-08 DIAGNOSIS — Z87891 Personal history of nicotine dependence: Secondary | ICD-10-CM | POA: Diagnosis not present

## 2020-02-08 DIAGNOSIS — M1990 Unspecified osteoarthritis, unspecified site: Secondary | ICD-10-CM | POA: Diagnosis present

## 2020-02-08 LAB — GLUCOSE, CAPILLARY
Glucose-Capillary: 105 mg/dL — ABNORMAL HIGH (ref 70–99)
Glucose-Capillary: 101 mg/dL — ABNORMAL HIGH (ref 70–99)
Glucose-Capillary: 98 mg/dL (ref 70–99)
Glucose-Capillary: 94 mg/dL (ref 70–99)
Glucose-Capillary: 145 mg/dL — ABNORMAL HIGH (ref 70–99)

## 2020-02-08 LAB — BASIC METABOLIC PANEL
Anion gap: 8 (ref 5–15)
BUN: 18 mg/dL (ref 8–23)
CO2: 23 mmol/L (ref 22–32)
Calcium: 9 mg/dL (ref 8.9–10.3)
Chloride: 110 mmol/L (ref 98–111)
Creatinine, Ser: 1.33 mg/dL — ABNORMAL HIGH (ref 0.61–1.24)
GFR calc Af Amer: 58 mL/min — ABNORMAL LOW (ref >60)
GFR calc non Af Amer: 50 mL/min — ABNORMAL LOW (ref >60)
Glucose, Bld: 119 mg/dL — ABNORMAL HIGH (ref 70–99)
Potassium: 4 mmol/L (ref 3.5–5.1)
Sodium: 141 mmol/L (ref 135–145)

## 2020-02-08 LAB — CBC
HCT: 36.4 % — ABNORMAL LOW (ref 39.0–52.0)
Hemoglobin: 11.7 g/dL — ABNORMAL LOW (ref 13.0–17.0)
MCH: 26.2 pg (ref 26.0–34.0)
MCHC: 32.1 g/dL (ref 30.0–36.0)
MCV: 81.4 fL (ref 80.0–100.0)
Platelets: 184 10*3/uL (ref 150–400)
RBC: 4.47 MIL/uL (ref 4.22–5.81)
RDW: 14.6 % (ref 11.5–15.5)
WBC: 4.1 10*3/uL (ref 4.0–10.5)
nRBC: 0 % (ref 0.0–0.2)

## 2020-02-08 MED ORDER — SODIUM CHLORIDE 0.9 % IV SOLN: INTRAVENOUS | Status: DC

## 2020-02-08 NOTE — Evaluation (Signed)
Occupational Therapy Evaluation Patient Details Name: Drew Hudson MRN: [id number] DOB: 1935/12/29 Today's Date: 02/08/2020    History of Present Illness Drew Hudson is a 84 y.o. male with medical history significant of CKD stage III, non-insulin-dependent type 2 diabetes, arthritis, cataract, GERD, hypertension, hyperlipidemia, idiopathic peripheral neuropathy, OSA on CPAP, prostate CA, R TKA and RUE DVT. Presents with syncopal episode. Head CT negative for acute abnormality.    Clinical Impression   Patient evaluated by Occupational Therapy with no further acute OT needs identified. All education has been completed and the patient has no further questions. See below for any follow-up Occupational Therapy or equipment needs. OT to sign off. Thank you for referral.      Follow Up Recommendations  No OT follow up    Equipment Recommendations  None recommended by OT    Recommendations for Other Services       Precautions / Restrictions Precautions Precautions: Fall Restrictions Weight Bearing Restrictions: No      Mobility Bed Mobility Overal bed mobility: Modified Independent                Transfers Overall transfer level: Needs assistance Equipment used: Rolling walker (2 wheeled) Transfers: Sit to/from Stand Sit to Stand: Supervision              Balance Overall balance assessment: Needs assistance Sitting-balance support: Feet supported Sitting balance-Leahy Scale: Good     Standing balance support: Bilateral upper extremity supported;During functional activity Standing balance-Leahy Scale: Fair                             ADL either performed or assessed with clinical judgement   ADL Overall ADL's : Modified independent                                       General ADL Comments: education on energy conservation, fall risk, and making sure to use sittin gpositions to decre fall risk. Provided medbridge code for  handouts. pt and daughter educated to have contact frequently initially with return home and to drive together the first time.     Vision Baseline Vision/History: Wears glasses Wears Glasses: At all times       Perception     Praxis      Pertinent Vitals/Pain Pain Assessment: No/denies pain     Hand Dominance Left   Extremity/Trunk Assessment Upper Extremity Assessment Upper Extremity Assessment: Overall WFL for tasks assessed   Lower Extremity Assessment Lower Extremity Assessment: Overall WFL for tasks assessed   Cervical / Trunk Assessment Cervical / Trunk Assessment: Normal   Communication Communication Communication: No difficulties   Cognition Arousal/Alertness: Awake/alert Behavior During Therapy: WFL for tasks assessed/performed Overall Cognitive Status: Within Functional Limits for tasks assessed                                     General Comments  educated on orthostatic BP symptoms. pt encouraged to walk with RN staff later today     Exercises     Shoulder Instructions      Home Living Family/patient expects to be discharged to:: Private residence Living Arrangements: Alone Available Help at Discharge: Family Type of Home: House Home Access: Stairs to enter [employer] of Steps: 3 Entrance Stairs-Rails: Right;Left;Can reach  both Home Layout: Able to live on main level with bedroom/bathroom;One level;Laundry or work area in basement     [employer]: Occupational psychologist: [place]: Kasandra Knudsen - single point;Walker - 2 wheels;Bedside commode;Shower seat - built in;Grab bars - tub/shower          Prior Functioning/Environment Level of Independence: Independent                 OT Problem List:        OT Treatment/Interventions:      OT Goals(Current goals can be found in the care plan section) Acute Rehab OT Goals Patient Stated Goal: to be able to stay at home alone  OT  Frequency:     Barriers to D/C:            Co-evaluation              AM-PAC OT "6 Clicks" Daily Activity     Outcome Measure Help from another person eating meals?: None Help from another person taking care of personal grooming?: None Help from another person toileting, which includes using toliet, bedpan, or urinal?: None Help from another person bathing (including washing, rinsing, drying)?: None Help from another person to put on and taking off regular upper body clothing?: None Help from another person to put on and taking off regular lower body clothing?: None 6 Click Score: 24   End of Session Nurse Communication: Mobility status;Precautions  Activity Tolerance: Patient tolerated treatment well Patient left: in chair;with call bell/phone within reach;with family/visitor present;with chair alarm set  OT Visit Diagnosis: Unsteadiness on feet (R26.81)                Time: [id number] OT Time Calculation (min): 50 min Charges:  OT General Charges $OT Visit: 1 Visit OT Evaluation $OT Eval Moderate Complexity: 1 Mod OT Treatments $Self Care/Home Management : 23-37 mins   Brynn, OTR/L  Acute Rehabilitation Services Pager: [phone] Office: [phone] .   Jeri Modena 02/08/2020, 4:15 PM

## 2020-02-08 NOTE — Progress Notes (Signed)
PROGRESS NOTE    Drew Hudson  PZW:[id number] DOB: [birth year] DOA: 02/06/2020 PCP: Alan Ripper, PA    Brief Narrative:   84 year old gentleman with prior history of stage III CKD, type 2 diabetes mellitus, GERD, hypertension, hyperlipidemia, obstructive sleep apnea on CPAP, history of right upper extremity DVT from a PICC line, presents to ED for recurrent dizziness and an episode of syncope.   Assessment & Plan:   Principal Problem:   Syncope Active Problems:   Essential hypertension   CKD (chronic kidney disease), stage III   Diabetes (HCC)   OSA on CPAP   Persistent dizziness and an episode of syncope. So further work-up has been negative but patient reports dizziness when he stands up from a sitting position. As per therapy evaluations small orthostatic vital signs have been positive. Overnight telemetry monitor showed first-degree AV block. CT angiogram was negative for PE. Echocardiogram does not show any thrombus. EEG does not show any epileptiform activity. Carotid duplex does not show any significant stenosis. Possibly from orthostatic versus ataxia rule out CVA.  MRI of the brain without contrast ordered for further evaluation.    Essential hypertension Hold home antihypertensives at this time and monitor.   Stage IIIa CKD Creatinine at baseline.    Type 2 diabetes mellitus Hemoglobin A1c is around 6 Continue with sliding scale insulin.   Obstructive sleep apnea Continue with CPAP   DVT prophylaxis: Lovenox Code Status: Full code Family Communication: No family at bedside Disposition Plan:  . Patient came from: Home            . Anticipated d/c place: Home possibly tomorrow . Barriers to d/c OR conditions which need to be met to effect a safe d/c: Further evaluation of syncope   Consultants:   None  Procedures: MRI of the brain is pending Antimicrobials: (None  Subjective: Patient denies any chest pain or shortness of breath but  reports persistent dizziness on standing up from a sitting position is at risk for recurrent syncopes or falls hence will need further evaluation with an MRI of the brain.  Objective: Vitals:   02/08/20 0051 02/08/20 0726 02/08/20 1231 02/08/20 1613  BP: 132/72 (!) 153/83 (!) 148/83 138/80  Pulse:  60 64 71  Resp:  '18 18 18  '$ Temp: 98.6 F (37 C) 98.2 F (36.8 C) 98.4 F (36.9 C) 98 F (36.7 C)  TempSrc: Oral Oral Oral Oral  SpO2: 99%  100% 100%  Weight:      Height:        Intake/Output Summary (Last 24 hours) at 02/08/2020 1835 Last data filed at 02/08/2020 1826 Gross per 24 hour  Intake 1193.24 ml  Output 825 ml  Net 368.24 ml   Filed Weights   02/06/20 1136 02/07/20 0500  Weight: 92.5 kg 92 kg    Examination:  General exam: Appears calm and comfortable  Respiratory system: Clear to auscultation. Respiratory effort normal. Cardiovascular system: S1 & S2 heard, RRR. No JVD,  pedal edema. Gastrointestinal system: Abdomen is nondistended, soft and nontender.Normal bowel sounds heard. Central nervous system: Alert and oriented. No focal neurological deficits. Extremities: Symmetric 5 x 5 power. Skin: No rashes, lesions or ulcers Psychiatry: Judgement and insight appear normal. Mood & affect appropriate.     Data Reviewed: I have personally reviewed following labs and imaging studies  CBC: Recent Labs  Lab 02/06/20 1238 02/08/20 0429  WBC 4.5 4.1  NEUTROABS 2.9  --   HGB 12.9* 11.7*  HCT 41.3 36.4*  MCV 83.6 81.4  PLT 194 381   Basic Metabolic Panel: Recent Labs  Lab 02/06/20 1238 02/08/20 0429  NA 139 141  K 4.2 4.0  CL 106 110  CO2 24 23  GLUCOSE 106* 119*  BUN 14 18  CREATININE 1.26* 1.33*  CALCIUM 9.2 9.0   GFR: Estimated Creatinine Clearance: 51 mL/min (A) (by C-G formula based on SCr of 1.33 mg/dL (H)). Liver Function Tests: No results for input(s): AST, ALT, ALKPHOS, BILITOT, PROT, ALBUMIN in the last 168 hours. No results for input(s):  LIPASE, AMYLASE in the last 168 hours. No results for input(s): AMMONIA in the last 168 hours. Coagulation Profile: No results for input(s): INR, PROTIME in the last 168 hours. Cardiac Enzymes: No results for input(s): CKTOTAL, CKMB, CKMBINDEX, TROPONINI in the last 168 hours. BNP (last 3 results) No results for input(s): PROBNP in the last 8760 hours. HbA1C: Recent Labs    02/06/20 2125  HGBA1C 6.0*   CBG: Recent Labs  Lab 02/07/20 1637 02/07/20 2120 02/08/20 0521 02/08/20 0615 02/08/20 1234  GLUCAP 124* 132* 105* 101* 98   Lipid Profile: No results for input(s): CHOL, HDL, LDLCALC, TRIG, CHOLHDL, LDLDIRECT in the last 72 hours. Thyroid Function Tests: Recent Labs    02/06/20 2227  TSH 1.158   Anemia Panel: No results for input(s): VITAMINB12, FOLATE, FERRITIN, TIBC, IRON, RETICCTPCT in the last 72 hours. Sepsis Labs: No results for input(s): PROCALCITON, LATICACIDVEN in the last 168 hours.  Recent Results (from the past 240 hour(s))  SARS CORONAVIRUS 2 (TAT 6-24 HRS) Nasopharyngeal Nasopharyngeal Swab     Status: None   Collection Time: 02/06/20  2:42 PM   Specimen: Nasopharyngeal Swab  Result Value Ref Range Status   SARS Coronavirus 2 NEGATIVE NEGATIVE Final    Comment: (NOTE) SARS-CoV-2 target nucleic acids are NOT DETECTED. The SARS-CoV-2 RNA is generally detectable in upper and lower respiratory specimens during the acute phase of infection. Negative results do not preclude SARS-CoV-2 infection, do not rule out co-infections with other pathogens, and should not be used as the sole basis for treatment or other patient management decisions. Negative results must be combined with clinical observations, patient history, and epidemiological information. The expected result is Negative. Fact Sheet for Patients: SugarRoll.be Fact Sheet for Healthcare Providers: https://www.woods-mathews.com/ This test is not yet approved  or cleared by the [place] FDA and  has been authorized for detection and/or diagnosis of SARS-CoV-2 by FDA under an Emergency Use Authorization (EUA). This EUA will remain  in effect (meaning this test can be used) for the duration of the COVID-19 declaration under Section 56 4(b)(1) of the Act, 21 U.S.C. section 360bbb-3(b)(1), unless the authorization is terminated or revoked sooner. Performed at [facility], [place] [address], [place], [place] [address]          Radiology Studies: CT ANGIO CHEST PE W OR WO CONTRAST  Result Date: 02/06/2020 CLINICAL DATA:  Recent syncopal episodes. EXAM: CT ANGIOGRAPHY CHEST WITH CONTRAST TECHNIQUE: Multidetector CT imaging of the chest was performed using the standard protocol during bolus administration of intravenous contrast. Multiplanar CT image reconstructions and MIPs were obtained to evaluate the vascular anatomy. CONTRAST:  41m OMNIPAQUE IOHEXOL 350 MG/ML SOLN COMPARISON:  None. FINDINGS: Cardiovascular: Satisfactory opacification of the pulmonary arteries to the segmental level. No evidence of pulmonary embolism. Normal heart size. No pericardial effusion. Marked severity coronary artery calcification is seen. Mediastinum/Nodes: There is mild right hilar lymphadenopathy. Lungs/Pleura: Very mild atelectasis is seen within  the bilateral lower lobes. There is no evidence of acute infiltrate, pleural effusion or pneumothorax. Upper Abdomen: Noninflamed diverticula are seen throughout visualized portion of the large bowel. Musculoskeletal: Multilevel degenerative changes seen throughout the thoracic spine. Review of the MIP images confirms the above findings. IMPRESSION: 1. No evidence of pulmonary embolism. 2. Marked severity coronary artery calcification. 3. Noninflamed diverticula seen throughout visualized portion of the large bowel. Electronically Signed   By: Virgina Norfolk M.D.   On: 02/06/2020 23:36   EEG adult  Result Date:  02/07/2020 Lora Havens, MD     02/07/2020  5:50 PM Patient Name: Drew Hudson MRN: [id number] Epilepsy Attending: Lora Havens Referring Physician/Provider: Dr Derrick Ravel Date: 02/07/2020 Duration: 25.17 mins Patient history: 83yo m presented with syncope. EEG to evaluate for seizure Level of alertness: awake AEDs during EEG study: None Technical aspects: This EEG study was done with scalp electrodes positioned according to the 10-20 International system of electrode placement. Electrical activity was acquired at a sampling rate of '500Hz'$  and reviewed with a high frequency filter of '70Hz'$  and a low frequency filter of '1Hz'$ . EEG data were recorded continuously and digitally stored. DESCRIPTION: The posterior dominant rhythm consists of 9-10 Hz activity of moderate voltage (25-35 uV) seen predominantly in posterior head regions, symmetric and reactive to eye opening and eye closing. Photic driving was not seen during photic stimulation.   Hyperventilation was not performed. IMPRESSION: This study is within normal limits. No seizures or epileptiform discharges were seen throughout the recording. Lora Havens   ECHOCARDIOGRAM COMPLETE  Result Date: 02/07/2020    ECHOCARDIOGRAM REPORT   Patient Name:   Drew Hudson Date of Exam: 02/07/2020 Medical Rec #:  [id number]       Height:       71.5 in Accession #:    [id number]      Weight:       202.8 lb Date of Birth:  05-08-1936       BSA:          2.132 m Patient Age:    26 years        BP:           143/73 mmHg Patient Gender: M               HR:           60 bpm. Exam Location:  Inpatient Procedure: 2D Echo, Cardiac Doppler and Color Doppler Indications:    R55 Syncope  History:        Patient has no prior history of Echocardiogram examinations.                 Signs/Symptoms:Dizziness/Lightheadedness, Chest Pain and                 Syncope; Risk Factors:Sleep Apnea, Former Smoker, Diabetes and                 Hypertension.  Sonographer:    Roseanna Rainbow  RDCS Referring Phys: [id number] [place]  1. Left ventricular ejection fraction, by estimation, is 65 to 70%. The left ventricle has hyperdynamic function. The left ventricle has no regional wall motion abnormalities. There is moderate concentric left ventricular hypertrophy. Left ventricular diastolic parameters are consistent with Grade I diastolic dysfunction (impaired relaxation).  2. Right ventricular systolic function is normal. The right ventricular size is normal. There is mildly elevated pulmonary artery systolic pressure. The estimated right ventricular systolic pressure is 96.2 mmHg.  3. Left atrial size was mildly dilated.  4. The mitral valve is normal in structure. Mild to moderate mitral valve regurgitation.  5. The aortic valve is normal in structure. Aortic valve regurgitation is trivial. No aortic stenosis is present.  6. The inferior vena cava is dilated in size with >50% respiratory variability, suggesting right atrial pressure of 8 mmHg. FINDINGS  Left Ventricle: Left ventricular ejection fraction, by estimation, is 65 to 70%. The left ventricle has hyperdynamic function. The left ventricle has no regional wall motion abnormalities. The left ventricular internal cavity size was normal in size. There is moderate concentric left ventricular hypertrophy. Left ventricular diastolic parameters are consistent with Grade I diastolic dysfunction (impaired relaxation). Indeterminate filling pressures. Right Ventricle: The right ventricular size is normal. No increase in right ventricular wall thickness. Right ventricular systolic function is normal. There is mildly elevated pulmonary artery systolic pressure. The tricuspid regurgitant velocity is 2.74  m/s, and with an assumed right atrial pressure of 8 mmHg, the estimated right ventricular systolic pressure is 88.4 mmHg. Left Atrium: Left atrial size was mildly dilated. Right Atrium: Right atrial size was normal in size. Pericardium:  There is no evidence of pericardial effusion. Mitral Valve: The mitral valve is normal in structure. There is mild late systolic prolapse of the middle scallop of the posterior leaflet of the mitral valve. Mild to moderate mitral valve regurgitation, with centrally-directed jet. Tricuspid Valve: The tricuspid valve is normal in structure. Tricuspid valve regurgitation is mild. Aortic Valve: The aortic valve is normal in structure. Aortic valve regurgitation is trivial. No aortic stenosis is present. Pulmonic Valve: The pulmonic valve was normal in structure. Pulmonic valve regurgitation is not visualized. Aorta: The aortic root and ascending aorta are structurally normal, with no evidence of dilitation. Venous: The inferior vena cava is dilated in size with greater than 50% respiratory variability, suggesting right atrial pressure of 8 mmHg. IAS/Shunts: No atrial level shunt detected by color flow Doppler.  LEFT VENTRICLE PLAX 2D LVIDd:         4.34 cm      Diastology LVIDs:         2.31 cm      LV e' lateral:   7.29 cm/s LV PW:         1.60 cm      LV E/e' lateral: 10.3 LV IVS:        1.46 cm      LV e' medial:    6.20 cm/s LVOT diam:     2.00 cm      LV E/e' medial:  12.1 LV SV:         79 LV SV Index:   37 LVOT Area:     3.14 cm  LV Volumes (MOD) LV vol d, MOD A2C: 80.5 ml LV vol d, MOD A4C: 134.0 ml LV vol s, MOD A2C: 27.6 ml LV vol s, MOD A4C: 46.3 ml LV SV MOD A2C:     52.9 ml LV SV MOD A4C:     134.0 ml LV SV MOD BP:      67.5 ml RIGHT VENTRICLE            IVC RV S prime:     9.10 cm/s  IVC diam: 2.07 cm LEFT ATRIUM             Index       RIGHT ATRIUM           Index LA diam:  3.80 cm 1.78 cm/m  RA Area:     14.00 cm LA Vol (A2C):   63.5 ml 29.78 ml/m RA Volume:   29.50 ml  13.84 ml/m LA Vol (A4C):   50.5 ml 23.68 ml/m LA Biplane Vol: 56.6 ml 26.54 ml/m  AORTIC VALVE LVOT Vmax:   127.00 cm/s LVOT Vmean:  87.700 cm/s LVOT VTI:    0.252 m  AORTA Ao Root diam: 3.60 cm Ao Asc diam:  3.70 cm MITRAL  VALVE                TRICUSPID VALVE MV Area (PHT): 2.45 cm     TR Peak grad:   30.0 mmHg MV Decel Time: 310 msec     TR Vmax:        274.00 cm/s MV E velocity: 75.20 cm/s MV A velocity: 101.00 cm/s  SHUNTS MV E/A ratio:  0.74         Systemic VTI:  0.25 m                             Systemic Diam: 2.00 cm Sanda Klein MD Electronically signed by Sanda Klein MD Signature Date/Time: 02/07/2020/10:23:08 AM    Final    VAS US CAROTID  Result Date: 02/07/2020 Carotid Arterial Duplex Study Indications:  Syncope. Risk Factors: Hypertension, hyperlipidemia, Diabetes. Performing Technologist: Maudry Mayhew MHA, RDMS, RVT, RDCS  Examination Guidelines: A complete evaluation includes B-mode imaging, spectral Doppler, color Doppler, and power Doppler as needed of all accessible portions of each vessel. Bilateral testing is considered an integral part of a complete examination. Limited examinations for reoccurring indications may be performed as noted.  Right Carotid Findings: +----------+--------+-------+--------+----------------------+------------------+           PSV cm/sEDV    StenosisPlaque Description    Comments                             cm/s                                                    +----------+--------+-------+--------+----------------------+------------------+ CCA Prox  125     13                                   intimal thickening +----------+--------+-------+--------+----------------------+------------------+ CCA Distal75      11                                                      +----------+--------+-------+--------+----------------------+------------------+ ICA Prox  43      11             smooth and                                                                heterogenous                             +----------+--------+-------+--------+----------------------+------------------+  ICA Distal64      17                                                       +----------+--------+-------+--------+----------------------+------------------+ ECA       82      10                                                      +----------+--------+-------+--------+----------------------+------------------+ +----------+--------+-------+----------------+-------------------+           PSV cm/sEDV cmsDescribe        Arm Pressure (mmHG) +----------+--------+-------+----------------+-------------------+ [id number]             Multiphasic, WNL                    +----------+--------+-------+----------------+-------------------+ +---------+--------+--+--------+-+---------+ VertebralPSV cm/s35EDV cm/s8Antegrade +---------+--------+--+--------+-+---------+  Left Carotid Findings: +----------+-------+-------+--------+------------------------+-----------------+           PSV    EDV    StenosisPlaque Description      Comments                    cm/s   cm/s                                                     +----------+-------+-------+--------+------------------------+-----------------+ CCA Prox  158    21                                     intimal                                                                   thickening        +----------+-------+-------+--------+------------------------+-----------------+ CCA Distal91     13                                     intimal                                                                   thickening        +----------+-------+-------+--------+------------------------+-----------------+ ICA Prox  62     12             smooth and heterogenous                   +----------+-------+-------+--------+------------------------+-----------------+ ICA Distal46     12                                                       +----------+-------+-------+--------+------------------------+-----------------+  ECA       109    14             heterogenous and                                                           irregular                                 +----------+-------+-------+--------+------------------------+-----------------+ +----------+--------+--------+----------------+-------------------+           PSV cm/sEDV cm/sDescribe        Arm Pressure (mmHG) +----------+--------+--------+----------------+-------------------+ Subclavian117             Multiphasic, WNL                    +----------+--------+--------+----------------+-------------------+ +---------+--------+--+--------+--+---------+ VertebralPSV cm/s59EDV cm/s21Antegrade +---------+--------+--+--------+--+---------+   Summary: Right Carotid: Velocities in the right ICA are consistent with a 1-39% stenosis. Left Carotid: Velocities in the left ICA are consistent with a 1-39% stenosis. Vertebrals:  Bilateral vertebral arteries demonstrate antegrade flow. Subclavians: Normal flow hemodynamics were seen in bilateral subclavian              arteries. Incidental finding: multiple heterogenous areas with vascularity of the left thyroid lobe; etiology unknown. *See table(s) above for measurements and observations.  Electronically signed by Deitra Mayo MD on 02/07/2020 at 3:20:44 PM.    Final         Scheduled Meds: . atorvastatin  20 mg Oral QHS  . cholecalciferol  1,000 Units Oral Daily  . dorzolamide-timolol  1 drop Both Eyes BID  . enoxaparin (LOVENOX) injection  40 mg Subcutaneous Q24H  . ferrous sulfate  325 mg Oral Q breakfast  . insulin aspart  0-5 Units Subcutaneous QHS  . insulin aspart  0-9 Units Subcutaneous TID WC  . latanoprost  1 drop Both Eyes QHS  . lisinopril  30 mg Oral QHS  . pantoprazole  20 mg Oral Daily  . polyvinyl alcohol  1 drop Both Eyes BID  . sodium chloride flush  3 mL Intravenous Q12H  . vitamin E  400 Units Oral Daily   Continuous Infusions: . sodium chloride 75 mL/hr at 02/08/20 1300     LOS: 0 days        Hosie Poisson,  MD [facility]   To contact the attending provider between 7A-7P or the covering provider during after hours 7P-7A, please log into the web site [email or web address] and access using universal [place] password for that web site. If you do not have the password, please call the hospital operator.  02/08/2020, 5:22 PM

## 2020-02-08 NOTE — Evaluation (Signed)
Physical Therapy Evaluation Patient Details Name: Drew Hudson MRN: [id number] DOB: [birth year] Today's Date: 02/08/2020   History of Present Illness  Drew Hudson is a 84 y.o. male with medical history significant of CKD stage III, non-insulin-dependent type 2 diabetes, arthritis, cataract, GERD, hypertension, hyperlipidemia, idiopathic peripheral neuropathy, OSA on CPAP, prostate CA, R TKA and RUE DVT. Presents with syncopal episode. Head CT negative for acute abnormality.   Clinical Impression  Prior to admission, pt lives alone, and is independent with mobility and ADL's. Pt reporting mild dizziness with transition to standing. Pt systolic BP did decrease, but not quite enough to be considered positive for orthostatic hypotension (see vitals flow sheet). Ambulating 150 feet with a walker at a supervision level. Recommended use of walker for external support initially upon discharge home. No PT follow up anticipated but will continue to follow acutely to progress mobility as tolerated.    Follow Up Recommendations No PT follow up;Supervision for mobility/OOB    Equipment Recommendations  None recommended by PT (has walker)   Recommendations for Other Services       Precautions / Restrictions Precautions Precautions: Fall Restrictions Weight Bearing Restrictions: No      Mobility  Bed Mobility Overal bed mobility: Modified Independent                Transfers Overall transfer level: Needs assistance Equipment used: Rolling walker (2 wheeled) Transfers: Sit to/from Stand Sit to Stand: Supervision         General transfer comment: Increased time to power up to stand  Ambulation/Gait Ambulation/Gait assistance: Supervision Gait Distance (Feet): 150 Feet Assistive device: Rolling walker (2 wheeled) Gait Pattern/deviations: Step-through pattern;Decreased stride length;Trunk flexed Gait velocity: decreased   General Gait Details: Tendency for downward gaze,  supervision for safety. no overt LOB  Stairs            Wheelchair Mobility    Modified Rankin (Stroke Patients Only)       Balance Overall balance assessment: Needs assistance Sitting-balance support: Feet supported Sitting balance-Leahy Scale: Good     Standing balance support: Bilateral upper extremity supported;During functional activity Standing balance-Leahy Scale: Fair                               Pertinent Vitals/Pain Pain Assessment: No/denies pain    Home Living Family/patient expects to be discharged to:: Private residence Living Arrangements: Alone Available Help at Discharge: Family Type of Home: House Home Access: Stairs to enter Entrance Stairs-Rails: Right;Left;Can reach both Entrance Stairs-Number of Steps: 3 Home Layout: Able to live on main level with bedroom/bathroom;One level;Laundry or work area in [place]: Kasandra Knudsen - single point;Walker - 2 wheels;Bedside commode;Shower seat - built in;Grab bars - tub/shower      Prior Function Level of Independence: Independent               Hand Dominance   Dominant Hand: Left    Extremity/Trunk Assessment   Upper Extremity Assessment Upper Extremity Assessment: Overall WFL for tasks assessed    Lower Extremity Assessment Lower Extremity Assessment: Overall WFL for tasks assessed       Communication   Communication: No difficulties  Cognition Arousal/Alertness: Awake/alert Behavior During Therapy: WFL for tasks assessed/performed Overall Cognitive Status: Within Functional Limits for tasks assessed  General Comments      Exercises     Assessment/Plan    PT Assessment Patient needs continued PT services  PT Problem List Decreased strength;Decreased activity tolerance;Decreased balance;Decreased mobility       PT Treatment Interventions DME instruction;Gait training;Stair training;Functional  mobility training;Therapeutic activities;Therapeutic exercise;Balance training;Patient/family education    PT Goals (Current goals can be found in the Care Plan section)  Acute Rehab PT Goals Patient Stated Goal: not feel dizzy PT Goal Formulation: With patient Time For Goal Achievement: 02/22/20 Potential to Achieve Goals: Good    Frequency Min 3X/week   Barriers to discharge        Co-evaluation               AM-PAC PT "6 Clicks" Mobility  Outcome Measure Help needed turning from your back to your side while in a flat bed without using bedrails?: None Help needed moving from lying on your back to sitting on the side of a flat bed without using bedrails?: None Help needed moving to and from a bed to a chair (including a wheelchair)?: None Help needed standing up from a chair using your arms (e.g., wheelchair or bedside chair)?: None Help needed to walk in hospital room?: None Help needed climbing 3-5 steps with a railing? : A Little 6 Click Score: 23    End of Session Equipment Utilized During Treatment: Gait belt Activity Tolerance: Patient tolerated treatment well Patient left: in chair;with call bell/phone within reach;with chair alarm set Nurse Communication: Mobility status PT Visit Diagnosis: Unsteadiness on feet (R26.81);Difficulty in walking, not elsewhere classified (R26.2)    Time: 1116-1140 PT Time Calculation (min) (ACUTE ONLY): 24 min   Charges:   PT Evaluation $PT Eval Moderate Complexity: 1 Mod PT Treatments $Therapeutic Activity: 8-22 mins          Wyona Almas, PT, DPT Acute Rehabilitation Services Pager [phone] Office [phone]   Deno Etienne 02/08/2020, 12:55 PM

## 2020-02-08 NOTE — Plan of Care (Signed)

## 2020-02-09 LAB — GLUCOSE, CAPILLARY
Glucose-Capillary: 101 mg/dL — ABNORMAL HIGH (ref 70–99)
Glucose-Capillary: 97 mg/dL (ref 70–99)

## 2020-02-09 NOTE — Progress Notes (Signed)
Physical Therapy Treatment Patient Details Name: Drew Hudson MRN: [id number] DOB: 1936-01-16 Today's Date: 02/09/2020    History of Present Illness Drew Hudson is a 84 y.o. male with medical history significant of CKD stage III, non-insulin-dependent type 2 diabetes, arthritis, cataract, GERD, hypertension, hyperlipidemia, idiopathic peripheral neuropathy, OSA on CPAP, prostate CA, R TKA and RUE DVT. Presents with syncopal episode. Head CT negative for acute abnormality.     PT Comments    Patient is making progress toward PT goals and tolerated increased mobility with no c/o dizziness. Current plan remains appropriate.   BP supine 170/83 (109) BP in sitting 168/97 (118) BP in standing 151/100 (115)    Follow Up Recommendations  No PT follow up;Supervision for mobility/OOB     Equipment Recommendations  None recommended by PT    Recommendations for Other Services       Precautions / Restrictions Precautions Precautions: Fall Restrictions Weight Bearing Restrictions: No    Mobility  Bed Mobility Overal bed mobility: Modified Independent                Transfers Overall transfer level: Needs assistance Equipment used: None Transfers: Sit to/from Stand Sit to Stand: Supervision         General transfer comment: supervision for safety  Ambulation/Gait Ambulation/Gait assistance: Supervision Gait Distance (Feet): 250 Feet Assistive device: Rolling walker (2 wheeled) Gait Pattern/deviations: Step-through pattern;Decreased stride length Gait velocity: decreased   General Gait Details: decreased cadence but overall steady gait with bilat UE support    Stairs             Wheelchair Mobility    Modified Rankin (Stroke Patients Only)       Balance Overall balance assessment: Needs assistance Sitting-balance support: Feet supported Sitting balance-Leahy Scale: Good     Standing balance support: Bilateral upper extremity supported;During  functional activity Standing balance-Leahy Scale: Poor                              Cognition Arousal/Alertness: Awake/alert Behavior During Therapy: WFL for tasks assessed/performed Overall Cognitive Status: Within Functional Limits for tasks assessed                                        Exercises      General Comments        Pertinent Vitals/Pain Pain Assessment: No/denies pain    Home Living                      Prior Function            PT Goals (current goals can now be found in the care plan section) Progress towards PT goals: Progressing toward goals    Frequency    Min 3X/week      PT Plan Current plan remains appropriate    Co-evaluation              AM-PAC PT "6 Clicks" Mobility   Outcome Measure  Help needed turning from your back to your side while in a flat bed without using bedrails?: None Help needed moving from lying on your back to sitting on the side of a flat bed without using bedrails?: None Help needed moving to and from a bed to a chair (including a wheelchair)?: None Help needed standing up from a chair using your  arms (e.g., wheelchair or bedside chair)?: None Help needed to walk in hospital room?: None Help needed climbing 3-5 steps with a railing? : A Little 6 Click Score: 23    End of Session Equipment Utilized During Treatment: Gait belt Activity Tolerance: Patient tolerated treatment well Patient left: in bed;with call bell/phone within reach;with family/visitor present(pt sitting EOB) Nurse Communication: Mobility status PT Visit Diagnosis: Unsteadiness on feet (R26.81);Difficulty in walking, not elsewhere classified (R26.2)     Time: [id number] PT Time Calculation (min) (ACUTE ONLY): 26 min  Charges:  $Gait Training: 8-22 mins $Therapeutic Activity: 8-22 mins                     Earney Navy, PTA Acute Rehabilitation Services Pager: [phone] Office: [phone]     Darliss Cheney 02/09/2020, 3:40 PM

## 2020-02-10 NOTE — Discharge Summary (Signed)
Physician Discharge Summary  Drew Hudson [id number] DOB: 06/05/1936 DOA: 02/06/2020  PCP: Alan Ripper, PA  Admit date: 02/06/2020 Discharge date: 02/09/2020  Admitted From: Home Disposition: Home  Recommendations for Outpatient Follow-up:  1. Follow up with PCP in 1-2 weeks 2. Please obtain BMP/CBC in one week 3. Please follow up with cardiology for event monitor placement for further evaluation of syncope.  4. Please do not drive until cleared by PCP or cardiology.     Discharge Condition: stable CODE STATUS: FULL CODE Diet recommendation: Heart Healthy  Brief/Interim Summary: 84 year old gentleman with prior history of stage III CKD, type 2 diabetes mellitus, GERD, hypertension, hyperlipidemia, obstructive sleep apnea on CPAP, history of right upper extremity DVT from a PICC line, presents to ED for recurrent dizziness and an episode of syncope.   Discharge Diagnoses:  Principal Problem:   Syncope Active Problems:   Essential hypertension   CKD (chronic kidney disease), stage III   Diabetes (HCC)   OSA on CPAP  Persistent dizziness and an episode of syncope. Possibly from orthostatic hypotension.  As per therapy evaluations orthostatic vital signs have been positive. He was started on IV fluids and hydrated appropriately and repeat orthostatic vital signs have been negative.  Overnight telemetry monitor showed first-degree AV block. CT angiogram was negative for PE. Echocardiogram does not show any thrombus. EEG does not show any epileptiform activity. Carotid duplex does not show any significant stenosis. Recommend outpatient follow up with cardiology for event monitor placement to check for arrhythmia's .      Essential hypertension Well controlled. Hold amlodipine on discharge due to orthostatic hypotension   Stage IIIa CKD Creatinine at baseline.    Type 2 diabetes mellitus Hemoglobin A1c is around 6. Resume metformin on discharge.      Obstructive sleep apnea Continue with CPAP   Discharge Instructions  Discharge Instructions    Diet - low sodium heart healthy   Complete by: As directed    Discharge instructions   Complete by: As directed    Please follow up with cardiology for event monitor placement.     Allergies as of 02/09/2020      Reactions   Lovastatin Itching   Penicillins Rash   Has patient had a PCN reaction causing immediate rash, facial/tongue/throat swelling, SOB or lightheadedness with hypotension: No Has patient had a PCN reaction causing severe rash involving mucus membranes or skin necrosis: No Has patient had a PCN reaction that required hospitalization: No Has patient had a PCN reaction occurring within the last 10 years: Yes If all of the above answers are "NO", then may proceed with Cephalosporin use.      Medication List    STOP taking these medications   amLODipine 10 MG tablet Commonly known as: NORVASC   gabapentin 300 MG capsule Commonly known as: NEURONTIN   methocarbamol 500 MG tablet Commonly known as: ROBAXIN   oxyCODONE 5 MG immediate release tablet Commonly known as: Oxy IR/ROXICODONE   traMADol 50 MG tablet Commonly known as: ULTRAM   Xarelto 20 MG Tabs tablet Generic drug: rivaroxaban     TAKE these medications   atorvastatin 20 MG tablet Commonly known as: LIPITOR Take 20 mg by mouth at bedtime.   cholecalciferol 25 MCG (1000 UNIT) tablet Commonly known as: VITAMIN D3 Take 1,000 Units by mouth daily.   dorzolamide-timolol 22.3-6.8 MG/ML ophthalmic solution Commonly known as: COSOPT Place 1 drop into both eyes 2 (two) times daily.   ferrous sulfate 325 (65 FE) MG  EC tablet Take 325 mg by mouth daily with breakfast.   latanoprost 0.005 % ophthalmic solution Commonly known as: XALATAN Place 1 drop into both eyes at bedtime.   lisinopril 30 MG tablet Commonly known as: ZESTRIL Take 30 mg by mouth at bedtime.   metFORMIN 500 MG  tablet Commonly known as: GLUCOPHAGE Take 500 mg by mouth daily.   omeprazole 20 MG capsule Commonly known as: PRILOSEC Take 20 mg by mouth every evening.   polyethylene glycol 17 g packet Commonly known as: MIRALAX / GLYCOLAX Take 17 g by mouth daily as needed for moderate constipation.   polyvinyl alcohol 1.4 % ophthalmic solution Commonly known as: LIQUIFILM TEARS Place 1 drop into both eyes 2 (two) times daily.   vitamin E 180 MG (400 UNITS) capsule Take 400 Units by mouth daily.      Follow-up Information    Alan Ripper, [place]. Schedule an appointment as soon as possible for a visit in 1 week(s).   Specialty: Physician Assistant Contact information: [address] [place] [address] [phone]        [facility] [employer]. Schedule an appointment as soon as possible for a visit in 1 week(s).   Specialty: Cardiology Why: for syncope and event monitor placement.  Contact information: [address], [place] [address] [phone]         Allergies  Allergen Reactions  . Lovastatin Itching  . Penicillins Rash    Has patient had a PCN reaction causing immediate rash, facial/tongue/throat swelling, SOB or lightheadedness with hypotension: No Has patient had a PCN reaction causing severe rash involving mucus membranes or skin necrosis: No Has patient had a PCN reaction that required hospitalization: No Has patient had a PCN reaction occurring within the last 10 years: Yes If all of the above answers are "NO", then may proceed with Cephalosporin use.     Consultations:  None.    Procedures/Studies: DG Chest 2 View  Result Date: 02/06/2020 CLINICAL DATA:  Syncope.  History of prostate cancer.  Ex-smoker. EXAM: CHEST - 2 VIEW COMPARISON:  01/15/2017 from [facility]. FINDINGS: Numerous leads and wires project over the chest. Midline trachea. Normal heart size and mediastinal contours. No pleural  effusion or pneumothorax. Clear lungs. IMPRESSION: No active cardiopulmonary disease. Electronically Signed   By: Abigail Miyamoto M.D.   On: 02/06/2020 12:37   CT Head Wo Contrast  Result Date: 02/06/2020 CLINICAL DATA:  Syncope with fall.  History of prostate carcinoma EXAM: CT HEAD WITHOUT CONTRAST TECHNIQUE: Contiguous axial images were obtained from the base of the skull through the vertex without intravenous contrast. COMPARISON:  None. FINDINGS: Brain: There is mild diffuse atrophy. There is no intracranial mass, hemorrhage, extra-axial fluid collection, or midline shift. There is patchy small vessel disease in the centra semiovale bilaterally. No acute infarct is demonstrable on this study. Vascular: No hyperdense vessel. There is calcification in the distal left vertebral artery and in both carotid siphon regions. Skull: Bony calvarium appears intact. Sinuses/Orbits: There is mucosal thickening in several ethmoid air cells. Other visualized paranasal sinuses are clear. Visualized orbits appear symmetric bilaterally. Other: Mastoid air cells are clear. IMPRESSION: Atrophy with periventricular small vessel disease. No acute infarct. No mass or hemorrhage. There are foci of arterial vascular calcification. There is mucosal thickening in several ethmoid air cells. Electronically Signed   By: Lowella Grip III M.D.   On: 02/06/2020 12:45   CT ANGIO CHEST PE W  OR WO CONTRAST  Result Date: 02/06/2020 CLINICAL DATA:  Recent syncopal episodes. EXAM: CT ANGIOGRAPHY CHEST WITH CONTRAST TECHNIQUE: Multidetector CT imaging of the chest was performed using the standard protocol during bolus administration of intravenous contrast. Multiplanar CT image reconstructions and MIPs were obtained to evaluate the vascular anatomy. CONTRAST:  42mL OMNIPAQUE IOHEXOL 350 MG/ML SOLN COMPARISON:  None. FINDINGS: Cardiovascular: Satisfactory opacification of the pulmonary arteries to the segmental level. No evidence of pulmonary  embolism. Normal heart size. No pericardial effusion. Marked severity coronary artery calcification is seen. Mediastinum/Nodes: There is mild right hilar lymphadenopathy. Lungs/Pleura: Very mild atelectasis is seen within the bilateral lower lobes. There is no evidence of acute infiltrate, pleural effusion or pneumothorax. Upper Abdomen: Noninflamed diverticula are seen throughout visualized portion of the large bowel. Musculoskeletal: Multilevel degenerative changes seen throughout the thoracic spine. Review of the MIP images confirms the above findings. IMPRESSION: 1. No evidence of pulmonary embolism. 2. Marked severity coronary artery calcification. 3. Noninflamed diverticula seen throughout visualized portion of the large bowel. Electronically Signed   By: Virgina Norfolk M.D.   On: 02/06/2020 23:36   MR BRAIN WO CONTRAST  Result Date: 02/08/2020 CLINICAL DATA:  Ataxia. Stroke suspected. EXAM: MRI HEAD WITHOUT CONTRAST TECHNIQUE: Multiplanar, multiecho pulse sequences of the brain and surrounding structures were obtained without intravenous contrast. COMPARISON:  Head CT February 06, 2020 FINDINGS: Brain: No acute infarction, hemorrhage, hydrocephalus, extra-axial collection or mass lesion. Scattered and confluent foci of T2 hyperintensity are seen within the white matter of the cerebral hemispheres, pons and middle cerebellar peduncles, nonspecific, most likely related to chronic small vessel ischemia. Stable 6 mm lesion within the right lateral ventricle with intermediate signal on T1 and T2 without evidence of obstruction, unchanged 2006. Vascular: Normal flow voids. Ectatic supraclinoid ICAs are noted. Skull and upper cervical spine: Normal marrow signal. Sinuses/Orbits: Negative. IMPRESSION: 1. No acute intracranial abnormality. 2. Moderate to advanced chronic small vessel ischemic changes. Electronically Signed   By: Pedro Earls M.D.   On: 02/08/2020 17:43   EEG adult  Result Date:  02/07/2020 Lora Havens, MD     02/07/2020  5:50 PM Patient Name: Macon Alejandro MRN: [id number] Epilepsy Attending: Lora Havens Referring Physician/Provider: Dr Derrick Ravel Date: 02/07/2020 Duration: 25.17 mins Patient history: 83yo m presented with syncope. EEG to evaluate for seizure Level of alertness: awake AEDs during EEG study: None Technical aspects: This EEG study was done with scalp electrodes positioned according to the 10-20 International system of electrode placement. Electrical activity was acquired at a sampling rate of 500Hz  and reviewed with a high frequency filter of 70Hz  and a low frequency filter of 1Hz . EEG data were recorded continuously and digitally stored. DESCRIPTION: The posterior dominant rhythm consists of 9-10 Hz activity of moderate voltage (25-35 uV) seen predominantly in posterior head regions, symmetric and reactive to eye opening and eye closing. Photic driving was not seen during photic stimulation.   Hyperventilation was not performed. IMPRESSION: This study is within normal limits. No seizures or epileptiform discharges were seen throughout the recording. Lora Havens   ECHOCARDIOGRAM COMPLETE  Result Date: 02/07/2020    ECHOCARDIOGRAM REPORT   Patient Name:   JERIUS KOZIK Date of Exam: 02/07/2020 Medical Rec #:  [id number]       Height:       71.5 in Accession #:    [id number]      Weight:       202.8 lb Date of Birth:  1936/10/11       BSA:          2.132 m Patient Age:    30 years        BP:           143/73 mmHg Patient Gender: M               HR:           60 bpm. Exam Location:  Inpatient Procedure: 2D Echo, Cardiac Doppler and Color Doppler Indications:    R55 Syncope  History:        Patient has no prior history of Echocardiogram examinations.                 Signs/Symptoms:Dizziness/Lightheadedness, Chest Pain and                 Syncope; Risk Factors:Sleep Apnea, Former Smoker, Diabetes and                 Hypertension.  Sonographer:    Roseanna Rainbow  RDCS Referring Phys: [id number] [place]  1. Left ventricular ejection fraction, by estimation, is 65 to 70%. The left ventricle has hyperdynamic function. The left ventricle has no regional wall motion abnormalities. There is moderate concentric left ventricular hypertrophy. Left ventricular diastolic parameters are consistent with Grade I diastolic dysfunction (impaired relaxation).  2. Right ventricular systolic function is normal. The right ventricular size is normal. There is mildly elevated pulmonary artery systolic pressure. The estimated right ventricular systolic pressure is 99991111 mmHg.  3. Left atrial size was mildly dilated.  4. The mitral valve is normal in structure. Mild to moderate mitral valve regurgitation.  5. The aortic valve is normal in structure. Aortic valve regurgitation is trivial. No aortic stenosis is present.  6. The inferior vena cava is dilated in size with >50% respiratory variability, suggesting right atrial pressure of 8 mmHg. FINDINGS  Left Ventricle: Left ventricular ejection fraction, by estimation, is 65 to 70%. The left ventricle has hyperdynamic function. The left ventricle has no regional wall motion abnormalities. The left ventricular internal cavity size was normal in size. There is moderate concentric left ventricular hypertrophy. Left ventricular diastolic parameters are consistent with Grade I diastolic dysfunction (impaired relaxation). Indeterminate filling pressures. Right Ventricle: The right ventricular size is normal. No increase in right ventricular wall thickness. Right ventricular systolic function is normal. There is mildly elevated pulmonary artery systolic pressure. The tricuspid regurgitant velocity is 2.74  m/s, and with an assumed right atrial pressure of 8 mmHg, the estimated right ventricular systolic pressure is 99991111 mmHg. Left Atrium: Left atrial size was mildly dilated. Right Atrium: Right atrial size was normal in size. Pericardium:  There is no evidence of pericardial effusion. Mitral Valve: The mitral valve is normal in structure. There is mild late systolic prolapse of the middle scallop of the posterior leaflet of the mitral valve. Mild to moderate mitral valve regurgitation, with centrally-directed jet. Tricuspid Valve: The tricuspid valve is normal in structure. Tricuspid valve regurgitation is mild. Aortic Valve: The aortic valve is normal in structure. Aortic valve regurgitation is trivial. No aortic stenosis is present. Pulmonic Valve: The pulmonic valve was normal in structure. Pulmonic valve regurgitation is not visualized. Aorta: The aortic root and ascending aorta are structurally normal, with no evidence of dilitation. Venous: The inferior vena cava is dilated in size with greater than 50% respiratory variability, suggesting right atrial pressure of 8 mmHg. IAS/Shunts: No atrial level shunt detected by  color flow Doppler.  LEFT VENTRICLE PLAX 2D LVIDd:         4.34 cm      Diastology LVIDs:         2.31 cm      LV e' lateral:   7.29 cm/s LV PW:         1.60 cm      LV E/e' lateral: 10.3 LV IVS:        1.46 cm      LV e' medial:    6.20 cm/s LVOT diam:     2.00 cm      LV E/e' medial:  12.1 LV SV:         79 LV SV Index:   37 LVOT Area:     3.14 cm  LV Volumes (MOD) LV vol d, MOD A2C: 80.5 ml LV vol d, MOD A4C: 134.0 ml LV vol s, MOD A2C: 27.6 ml LV vol s, MOD A4C: 46.3 ml LV SV MOD A2C:     52.9 ml LV SV MOD A4C:     134.0 ml LV SV MOD BP:      67.5 ml RIGHT VENTRICLE            IVC RV S prime:     9.10 cm/s  IVC diam: 2.07 cm LEFT ATRIUM             Index       RIGHT ATRIUM           Index LA diam:        3.80 cm 1.78 cm/m  RA Area:     14.00 cm LA Vol (A2C):   63.5 ml 29.78 ml/m RA Volume:   29.50 ml  13.84 ml/m LA Vol (A4C):   50.5 ml 23.68 ml/m LA Biplane Vol: 56.6 ml 26.54 ml/m  AORTIC VALVE LVOT Vmax:   127.00 cm/s LVOT Vmean:  87.700 cm/s LVOT VTI:    0.252 m  AORTA Ao Root diam: 3.60 cm Ao Asc diam:  3.70 cm MITRAL  VALVE                TRICUSPID VALVE MV Area (PHT): 2.45 cm     TR Peak grad:   30.0 mmHg MV Decel Time: 310 msec     TR Vmax:        274.00 cm/s MV E velocity: 75.20 cm/s MV A velocity: 101.00 cm/s  SHUNTS MV E/A ratio:  0.74         Systemic VTI:  0.25 m                             Systemic Diam: 2.00 cm Dani Gobble Croitoru MD Electronically signed by Sanda Klein MD Signature Date/Time: 02/07/2020/10:23:08 AM    Final    VAS US CAROTID  Result Date: 02/07/2020 Carotid Arterial Duplex Study Indications:  Syncope. Risk Factors: Hypertension, hyperlipidemia, Diabetes. Performing Technologist: Maudry Mayhew MHA, RDMS, RVT, RDCS  Examination Guidelines: A complete evaluation includes B-mode imaging, spectral Doppler, color Doppler, and power Doppler as needed of all accessible portions of each vessel. Bilateral testing is considered an integral part of a complete examination. Limited examinations for reoccurring indications may be performed as noted.  Right Carotid Findings: +----------+--------+-------+--------+----------------------+------------------+           PSV cm/sEDV    StenosisPlaque Description    Comments  cm/s                                                    +----------+--------+-------+--------+----------------------+------------------+ CCA Prox  125     13                                   intimal thickening +----------+--------+-------+--------+----------------------+------------------+ CCA Distal75      11                                                      +----------+--------+-------+--------+----------------------+------------------+ ICA Prox  43      11             smooth and                                                                heterogenous                             +----------+--------+-------+--------+----------------------+------------------+ ICA Distal64      17                                                       +----------+--------+-------+--------+----------------------+------------------+ ECA       82      10                                                      +----------+--------+-------+--------+----------------------+------------------+ +----------+--------+-------+----------------+-------------------+           PSV cm/sEDV cmsDescribe        Arm Pressure (mmHG) +----------+--------+-------+----------------+-------------------+ [id number]             Multiphasic, WNL                    +----------+--------+-------+----------------+-------------------+ +---------+--------+--+--------+-+---------+ VertebralPSV cm/s35EDV cm/s8Antegrade +---------+--------+--+--------+-+---------+  Left Carotid Findings: +----------+-------+-------+--------+------------------------+-----------------+           PSV    EDV    StenosisPlaque Description      Comments                    cm/s   cm/s                                                     +----------+-------+-------+--------+------------------------+-----------------+ CCA Prox  158    21  intimal                                                                   thickening        +----------+-------+-------+--------+------------------------+-----------------+ CCA Distal91     13                                     intimal                                                                   thickening        +----------+-------+-------+--------+------------------------+-----------------+ ICA Prox  62     12             smooth and heterogenous                   +----------+-------+-------+--------+------------------------+-----------------+ ICA Distal46     12                                                       +----------+-------+-------+--------+------------------------+-----------------+ ECA       109    14             heterogenous and                                                           irregular                                 +----------+-------+-------+--------+------------------------+-----------------+ +----------+--------+--------+----------------+-------------------+           PSV cm/sEDV cm/sDescribe        Arm Pressure (mmHG) +----------+--------+--------+----------------+-------------------+ Subclavian117             Multiphasic, WNL                    +----------+--------+--------+----------------+-------------------+ +---------+--------+--+--------+--+---------+ VertebralPSV cm/s59EDV cm/s21Antegrade +---------+--------+--+--------+--+---------+   Summary: Right Carotid: Velocities in the right ICA are consistent with a 1-39% stenosis. Left Carotid: Velocities in the left ICA are consistent with a 1-39% stenosis. Vertebrals:  Bilateral vertebral arteries demonstrate antegrade flow. Subclavians: Normal flow hemodynamics were seen in bilateral subclavian              arteries. Incidental finding: multiple heterogenous areas with vascularity of the left thyroid lobe; etiology unknown. *See table(s) above for measurements and observations.  Electronically signed by Deitra Mayo MD on 02/07/2020 at 3:20:44 PM.    Final        Subjective: No new complaints  Discharge Exam: Vitals:   02/09/20 0815 02/09/20  1300  BP: (!) 155/87 (!) 142/85  Pulse: 61 62  Resp: 16 16  Temp: 98 F (36.7 C) 98.5 F (36.9 C)  SpO2: 100% 99%   Vitals:   02/09/20 0404 02/09/20 0517 02/09/20 0815 02/09/20 1300  BP:  (!) 148/76 (!) 155/87 (!) 142/85  Pulse:  62 61 62  Resp:  18 16 16   Temp:  97.6 F (36.4 C) 98 F (36.7 C) 98.5 F (36.9 C)  TempSrc:  Oral Oral Oral  SpO2:  98% 100% 99%  Weight: 93.2 kg     Height:        General: Pt is alert, awake, not in acute distress Cardiovascular: RRR, S1/S2 +, no rubs, no gallops Respiratory: CTA bilaterally, no wheezing, no rhonchi Abdominal: Soft, NT, ND, bowel  sounds + Extremities: no edema, no cyanosis    The results of significant diagnostics from this hospitalization (including imaging, microbiology, ancillary and laboratory) are listed below for reference.     Microbiology: Recent Results (from the past 240 hour(s))  SARS CORONAVIRUS 2 (TAT 6-24 HRS) Nasopharyngeal Nasopharyngeal Swab     Status: None   Collection Time: 02/06/20  2:42 PM   Specimen: Nasopharyngeal Swab  Result Value Ref Range Status   SARS Coronavirus 2 NEGATIVE NEGATIVE Final    Comment: (NOTE) SARS-CoV-2 target nucleic acids are NOT DETECTED. The SARS-CoV-2 RNA is generally detectable in upper and lower respiratory specimens during the acute phase of infection. Negative results do not preclude SARS-CoV-2 infection, do not rule out co-infections with other pathogens, and should not be used as the sole basis for treatment or other patient management decisions. Negative results must be combined with clinical observations, patient history, and epidemiological information. The expected result is Negative. Fact Sheet for Patients: SugarRoll.be Fact Sheet for Healthcare Providers: https://www.woods-mathews.com/ This test is not yet approved or cleared by the [place] FDA and  has been authorized for detection and/or diagnosis of SARS-CoV-2 by FDA under an Emergency Use Authorization (EUA). This EUA will remain  in effect (meaning this test can be used) for the duration of the COVID-19 declaration under Section 56 4(b)(1) of the Act, 21 U.S.C. section 360bbb-3(b)(1), unless the authorization is terminated or revoked sooner. Performed at [facility], [place] [address], [place], [place] [address]      Labs: BNP (last 3 results) No results for input(s): BNP in the last 8760 hours. Basic Metabolic Panel: Recent Labs  Lab 02/06/20 1238 02/08/20 0429  NA 139 141  K 4.2 4.0  CL 106 110  CO2 24 23  GLUCOSE 106*  119*  BUN 14 18  CREATININE 1.26* 1.33*  CALCIUM 9.2 9.0   Liver Function Tests: No results for input(s): AST, ALT, ALKPHOS, BILITOT, PROT, ALBUMIN in the last 168 hours. No results for input(s): LIPASE, AMYLASE in the last 168 hours. No results for input(s): AMMONIA in the last 168 hours. CBC: Recent Labs  Lab 02/06/20 1238 02/08/20 0429  WBC 4.5 4.1  NEUTROABS 2.9  --   HGB 12.9* 11.7*  HCT 41.3 36.4*  MCV 83.6 81.4  PLT 194 184   Cardiac Enzymes: No results for input(s): CKTOTAL, CKMB, CKMBINDEX, TROPONINI in the last 168 hours. BNP: Invalid input(s): POCBNP CBG: Recent Labs  Lab 02/08/20 1234 02/08/20 1725 02/08/20 2111 02/09/20 0623 02/09/20 1205  GLUCAP 98 94 145* 101* 97   D-Dimer No results for input(s): DDIMER in the last 72 hours. Hgb A1c No results for input(s): HGBA1C in the last  72 hours. Lipid Profile No results for input(s): CHOL, HDL, LDLCALC, TRIG, CHOLHDL, LDLDIRECT in the last 72 hours. Thyroid function studies No results for input(s): TSH, T4TOTAL, T3FREE, THYROIDAB in the last 72 hours.  Invalid input(s): FREET3 Anemia work up No results for input(s): VITAMINB12, FOLATE, FERRITIN, TIBC, IRON, RETICCTPCT in the last 72 hours. Urinalysis No results found for: COLORURINE, APPEARANCEUR, [place], [place], [place], [place], [place], [place], PROTEINUR, UROBILINOGEN, NITRITE, LEUKOCYTESUR Sepsis Labs Invalid input(s): PROCALCITONIN,  WBC,  LACTICIDVEN Microbiology Recent Results (from the past 240 hour(s))  SARS CORONAVIRUS 2 (TAT 6-24 HRS) Nasopharyngeal Nasopharyngeal Swab     Status: None   Collection Time: 02/06/20  2:42 PM   Specimen: Nasopharyngeal Swab  Result Value Ref Range Status   SARS Coronavirus 2 NEGATIVE NEGATIVE Final    Comment: (NOTE) SARS-CoV-2 target nucleic acids are NOT DETECTED. The SARS-CoV-2 RNA is generally detectable in upper and lower respiratory specimens during the acute phase of infection. Negative results  do not preclude SARS-CoV-2 infection, do not rule out co-infections with other pathogens, and should not be used as the sole basis for treatment or other patient management decisions. Negative results must be combined with clinical observations, patient history, and epidemiological information. The expected result is Negative. Fact Sheet for Patients: SugarRoll.be Fact Sheet for Healthcare Providers: https://www.woods-mathews.com/ This test is not yet approved or cleared by the [place] FDA and  has been authorized for detection and/or diagnosis of SARS-CoV-2 by FDA under an Emergency Use Authorization (EUA). This EUA will remain  in effect (meaning this test can be used) for the duration of the COVID-19 declaration under Section 56 4(b)(1) of the Act, 21 U.S.C. section 360bbb-3(b)(1), unless the authorization is terminated or revoked sooner. Performed at [facility], [place] [address], [place], [place] [address]      Time coordinating discharge: 32 minutes  SIGNED:   Hosie Poisson, MD  [facility] 02/10/2020, 9:48 AM

## 2020-02-16 ENCOUNTER — Telehealth: Payer: Self-pay | Admitting: Cardiology

## 2020-02-16 NOTE — Telephone Encounter (Signed)
Drew Hudson is calling requesting his daughter Drew Hudson attend his upcoming appointment scheduled for 02/17/20 due to him not being able to drive for 2 weeks per Doctor's orders. Please call [name] back in regards to this. Her cell phone number is [phone]. Please advise.

## 2020-02-16 NOTE — Telephone Encounter (Signed)
Spoke with Drew Hudson regarding Dr. Joya Gaskins recommendation that she can come and either sit in the lobby or wait in the car for the patient. I let her know that we can call her from the patients room once the doctor comes in.   She verbalizes understanding and is agreeable to the plan of care. Advised patient to call back with any issues or concerns.

## 2020-02-16 NOTE — Telephone Encounter (Signed)
I would use her usual criteria that unless he is physically or cognitively incapable she can clearly drive him to the office she can wait outside or in the waiting room with his med [facility] but I would not bring her back as an exception to how we handle patients in our office.

## 2020-02-17 ENCOUNTER — Other Ambulatory Visit: Payer: Self-pay

## 2020-02-17 ENCOUNTER — Ambulatory Visit: Payer: Medicare HMO | Admitting: Cardiology

## 2020-02-17 ENCOUNTER — Encounter: Payer: Self-pay | Admitting: Cardiology

## 2020-02-17 VITALS — BP 154/78 | HR 70 | Ht 71.5 in | Wt 203.0 lb

## 2020-02-17 DIAGNOSIS — E1122 Type 2 diabetes mellitus with diabetic chronic kidney disease: Secondary | ICD-10-CM

## 2020-02-17 DIAGNOSIS — E785 Hyperlipidemia, unspecified: Secondary | ICD-10-CM | POA: Insufficient documentation

## 2020-02-17 DIAGNOSIS — I452 Bifascicular block: Secondary | ICD-10-CM

## 2020-02-17 DIAGNOSIS — H47012 Ischemic optic neuropathy, left eye: Secondary | ICD-10-CM | POA: Insufficient documentation

## 2020-02-17 DIAGNOSIS — H409 Unspecified glaucoma: Secondary | ICD-10-CM

## 2020-02-17 DIAGNOSIS — I10 Essential (primary) hypertension: Secondary | ICD-10-CM

## 2020-02-17 DIAGNOSIS — J300 Vasomotor rhinitis: Secondary | ICD-10-CM | POA: Insufficient documentation

## 2020-02-17 DIAGNOSIS — E559 Vitamin D deficiency, unspecified: Secondary | ICD-10-CM | POA: Insufficient documentation

## 2020-02-17 DIAGNOSIS — Z9889 Other specified postprocedural states: Secondary | ICD-10-CM | POA: Insufficient documentation

## 2020-02-17 DIAGNOSIS — R3915 Urgency of urination: Secondary | ICD-10-CM | POA: Insufficient documentation

## 2020-02-17 DIAGNOSIS — R55 Syncope and collapse: Secondary | ICD-10-CM | POA: Diagnosis not present

## 2020-02-17 DIAGNOSIS — N183 Chronic kidney disease, stage 3 unspecified: Secondary | ICD-10-CM

## 2020-02-17 DIAGNOSIS — K219 Gastro-esophageal reflux disease without esophagitis: Secondary | ICD-10-CM | POA: Insufficient documentation

## 2020-02-17 NOTE — Progress Notes (Signed)
Cardiology Office Note:    Date:  02/17/2020   ID:  Drew Hudson, DOB 04/05/1936, MRN [id number]  PCP:  Alan Ripper, [place]  Cardiologist:  Shirlee More, MD   Referring MD: Alan Ripper, PA  ASSESSMENT:    1. Syncope, unspecified syncope type   2. RBBB with left anterior fascicular block   3. Essential hypertension   4. Controlled type 2 diabetes mellitus with stage 3 chronic kidney disease, without long-term current use of insulin ([place])   5. Glaucoma, unspecified glaucoma type, unspecified laterality    PLAN:    In order of problems listed above:  1. He had an abrupt episode of syncope he has bifascicular heart block first-degree AV block no obvious precipitant I think he should have a permanent pacemaker I reviewed the options with the patient and he wants to proceed and evaluation of his palpitation would be deferred to use the device telemetry and monitoring.  The alternative is extended ambulatory monitor implanted loop recorder but it does not change the outcome.  Patient is very interested in being able to resume his life and wishes to proceed.  His other medical problem hypertension stable BP at target and continue his ACE inhibitor.  He is diabetic but this episode was not symptomatic hypoglycemia.  Should be noted he is taken beta-blocker eyedrop.  I do not think he requires admission to the hospital or temporary pacemaker and is likely to have another episode in the short-term is quite low and we are making arrangements for him to be seen within the 1-2 next week.  Next appointment as needed he will follow up with the EP   Medication Adjustments/Labs and Tests Ordered: Current medicines are reviewed at length with the patient today.  Concerns regarding medicines are outlined above.  Orders Placed This Encounter  Procedures  . Ambulatory referral to Cardiac Electrophysiology   No orders of the defined types were placed in this encounter.    Chief Complaint   Patient presents with  . New Patient (Initial Visit)    History of Present Illness:    Drew Hudson is a 84 y.o. male who is being seen today for the evaluation of syncope at the request of Alan Ripper, [place].  He has a history of stage III CKD type 2 diabetes hypertension hyperlipidemia obstructive sleep apnea on CPAP history of right upper extremity DVT from a PICC line and recently was admitted to [facility] with syncope.  He was not seen by cardiology during the admission.  His EKG showed bifascicular heart block and echocardiogram showed EF of 65 to 70% moderate LVH mild left atrial enlargement and mild to moderate mitral regurgitation.  I have personally reviewed the echocardiogram for this visit.  Previous EKG 08/19/2019 showed right bundle branch block.  He was also noted on telemetry in hospital to have first-degree heart block intermittently.  I independently reviewed the rhythm strips and the PR interval is 0.2 to 0.240 seconds.  He relates his history to me had a very abrupt syncopal episode after he got off the toilet at his girlfriend's home he had no warning he fell down sounded violent from her description he has some abrasions but no severe injury.  Afterwards he was a little bit confused but there is no ictal activity.  He had a syncopal episode greater than 30 years ago.  He has some palpitation where he noticed his heart speeding up in the morning and that is a new finding.  In  hospital he had no arrhythmia except first-degree AV block and his EKG showed new pattern bifascicular heart block previous right bundle branch block.  I reviewed with the patient I think he is at high risk of recurrent syncope and satisfies criteria for pacemaker therapy with bifascicular heart block first-degree AV block and syncope.  I will refer him to EP for further evaluation and treatment.  The time being I told him he should not drive a car.  He has limitations in ambulation uses a walker  because of knee surgery but no chest pain edema shortness of breath and no known heart disease.  I have summarized his hospital course and studies above he does not have a cardiomyopathy. Past Medical History:  Diagnosis Date  . Arthritis   . Cataract   . Chronic kidney disease    Stage 3 per pt  . Diabetes mellitus without complication (HCC)    diet controlled  . Dizziness   . Dry eyes   . Dysrhythmia   . Edema    Bilateral legs  . Fecal incontinence   . Generalized weakness   . GERD (gastroesophageal reflux disease)   . Glaucoma   . Headache   . History of chest pain    Saw cardiologist, no blockages noted, possible gas pains  . Hyperlipidemia   . Hypertension   . Idiopathic peripheral neuropathy   . Insomnia   . OSA on CPAP   . Pneumonia   . Prostate cancer ([place])   . Urinary urgency   . Vitamin D deficiency   . Wears glasses   . Wears hearing aid in both ears   . Wears partial dentures     Past Surgical History:  Procedure Laterality Date  . ANAL RECTAL MANOMETRY  10/08/2018  . ANKLE SURGERY    . BIOPSY THYROID Left 04/08/2015  . CARDIAC CATHETERIZATION    . COLONOSCOPY    . CYSTOSCOPY    . EPIDIDYMIS SURGERY    . EXCISIONAL TOTAL KNEE ARTHROPLASTY WITH ANTIBIOTIC SPACERS Right 08/22/2019   Procedure: Right knee resection arthroplasty; antibiotic spacer;  Surgeon: Gaynelle Arabian, MD;  Location: [facility];  Service: Orthopedics;  Laterality: Right;  81min  . HEMORRHOID SURGERY    . PENILE PROSTHESIS IMPLANT    . PROSTATECTOMY    . REIMPLANTATION OF TOTAL KNEE Right 12/05/2019   Procedure: REIMPLANTATION OF TOTAL KNEE;  Surgeon: Gaynelle Arabian, MD;  Location: [facility];  Service: Orthopedics;  Laterality: Right;  122min  . TOTAL KNEE ARTHROPLASTY Right 10/18/2018   Procedure: RIGHT TOTAL KNEE ARTHROPLASTY;  Surgeon: Gaynelle Arabian, MD;  Location: [facility];  Service: Orthopedics;  Laterality: Right;  21min    Current Medications: Current Meds  Medication Sig  .  atorvastatin (LIPITOR) 20 MG tablet Take 20 mg by mouth at bedtime.   . cholecalciferol (VITAMIN D3) 25 MCG (1000 UNIT) tablet Take 1,000 Units by mouth daily.  . dorzolamide-timolol (COSOPT) 22.3-6.8 MG/ML ophthalmic solution Place 1 drop into both eyes 2 (two) times daily.  . ferrous sulfate 325 (65 FE) MG EC tablet Take 325 mg by mouth daily with breakfast.  . latanoprost (XALATAN) 0.005 % ophthalmic solution Place 1 drop into both eyes at bedtime.   Marland Kitchen lisinopril (ZESTRIL) 30 MG tablet Take 30 mg by mouth at bedtime.   . metFORMIN (GLUCOPHAGE) 500 MG tablet Take 500 mg by mouth daily.  Marland Kitchen omeprazole (PRILOSEC) 20 MG capsule Take 20 mg by mouth every evening.   . polyethylene glycol (MIRALAX /  GLYCOLAX) packet Take 17 g by mouth daily as needed for moderate constipation.  . polyvinyl alcohol (LIQUIFILM TEARS) 1.4 % ophthalmic solution Place 1 drop into both eyes 2 (two) times daily.   . vitamin E 180 MG (400 UNITS) capsule Take 400 Units by mouth daily.     Allergies:   Lovastatin and Penicillins   Social History   Socioeconomic History  . Marital status: Widowed    Spouse name: Not on file  . Number of children: Not on file  . Years of education: Not on file  . Highest education level: Not on file  Occupational History  . Not on file  Tobacco Use  . Smoking status: Former Research scientist (life sciences)  . Smokeless tobacco: Never Used  Substance and Sexual Activity  . Alcohol use: Never  . Drug use: Never  . Sexual activity: Not on file  Other Topics Concern  . Not on file  Social History Narrative  . Not on file   Social Determinants of Health   Financial Resource Strain:   . Difficulty of Paying Living Expenses:   Food Insecurity:   . Worried About Charity fundraiser in the Last Year:   . Arboriculturist in the Last Year:   Transportation Needs:   . Film/video editor (Medical):   Marland Kitchen Lack of Transportation (Non-Medical):   Physical Activity:   . Days of Exercise per Week:   . Minutes  of Exercise per Session:   Stress:   . Feeling of Stress :   Social Connections:   . Frequency of Communication with Friends and Family:   . Frequency of Social Gatherings with Friends and Family:   . Attends Religious Services:   . Active Member of Clubs or Organizations:   . Attends Archivist Meetings:   Marland Kitchen Marital Status:      Family History: The patient's family history includes Clotting disorder in his father; Hypertension in his mother.  ROS:   Review of Systems  Constitution: Negative.  HENT: Negative.   Eyes: Negative.   Cardiovascular: Positive for palpitations and syncope.  Respiratory: Negative.   Endocrine: Negative.   Hematologic/Lymphatic: Negative.   Skin: Negative.   Musculoskeletal: Positive for joint pain.  Gastrointestinal: Negative.   Genitourinary: Negative.   Psychiatric/Behavioral: Negative.   Allergic/Immunologic: Negative.    Please see the history of present illness.      All other systems reviewed and are negative.  EKGs/Labs/Other Studies Reviewed:    The following studies were reviewed today:   Recent Labs: 11/30/2019: ALT 13 02/06/2020: TSH 1.158 02/08/2020: BUN 18; Creatinine, Ser 1.33; Hemoglobin 11.7; Platelets 184; Potassium 4.0; Sodium 141  Recent Lipid Panel No results found for: CHOL, TRIG, HDL, CHOLHDL, VLDL, LDLCALC, LDLDIRECT  Physical Exam:    VS:  BP (!) 154/78   Pulse 70   Ht 5' 11.5" (1.816 m)   Wt 203 lb (92.1 kg)   SpO2 98%   BMI 27.92 kg/m     Wt Readings from Last 3 Encounters:  02/17/20 203 lb (92.1 kg)  02/09/20 205 lb 7.5 oz (93.2 kg)  12/05/19 206 lb 5.6 oz (93.6 kg)     GEN: Looks younger than his age uses a walker for ambulation well nourished, well developed in no acute distress HEENT: Normal NECK: No JVD; No carotid bruits LYMPHATICS: No lymphadenopathy CARDIAC: RRR, no murmurs, rubs, gallops RESPIRATORY:  Clear to auscultation without rales, wheezing or rhonchi  ABDOMEN: Soft, non-tender,  non-distended MUSCULOSKELETAL:  No edema; No deformity  SKIN: Warm and dry NEUROLOGIC:  Alert and oriented x 3 PSYCHIATRIC:  Normal affect     Signed, Shirlee More, MD  02/17/2020 3:14 PM     [facility]

## 2020-02-17 NOTE — Patient Instructions (Addendum)
Medication Instructions:  Your physician recommends that you continue on your current medications as directed. Please refer to the Current Medication list given to you today.  *If you need a refill on your cardiac medications before your next appointment, please call your pharmacy*   Lab Work: None If you have labs (blood work) drawn today and your tests are completely normal, you will receive your results only by: Marland Kitchen MyChart Message (if you have MyChart) OR . A paper copy in the mail If you have any lab test that is abnormal or we need to change your treatment, we will call you to review the results.   Testing/Procedures: None   Follow-Up: At [facility], you and your health needs are our priority.  As part of our continuing mission to provide you with exceptional heart care, we have created designated Provider Care Teams.  These Care Teams include your primary Cardiologist (physician) and Advanced Practice Providers (APPs -  Physician Assistants and Nurse Practitioners) who all work together to provide you with the care you need, when you need it.  We recommend signing up for the patient portal called "MyChart".  Sign up information is provided on this After Visit Summary.  MyChart is used to connect with patients for Virtual Visits (Telemedicine).  Patients are able to view lab/test results, encounter notes, upcoming appointments, etc.  Non-urgent messages can be sent to your provider as well.   To learn more about what you can do with MyChart, go to [email or web address].    Your next appointment:   As needed  The format for your next appointment:   In Person  Provider:   Shirlee More, MD   Other Instructions We have put in a referral for you to see Dr. Curt Bears. They will get in contact with you to schedule this appointment.

## 2020-02-24 ENCOUNTER — Telehealth: Payer: Self-pay | Admitting: Cardiology

## 2020-02-24 NOTE — Telephone Encounter (Signed)
Patient is calling to follow up in regards to consultation for pacemaker appointment scheduled for 03/01/20 with Dr. Curt Bears. He states he is requesting clarity and to further discuss why pacemaker is needed. Please call.

## 2020-02-24 NOTE — Telephone Encounter (Signed)
Spoke with the patient and I read him a little bit of Dr. Joya Gaskins note in regards to why he needs the pacemaker. He is agreeable to the plan of care at this time and states that he will follow up with him at his next scheduled appointment. No other issues or concerns were noted.    Encouraged patient to call back with any questions or concerns.

## 2020-02-27 ENCOUNTER — Telehealth: Payer: Self-pay | Admitting: Cardiology

## 2020-02-27 NOTE — Telephone Encounter (Signed)
Patient daughter would like to come with her father, she states he can't drive, uses a walker, and he is 84 years old.  He is also coming in to discuss possibly having a pacemaker implanted.

## 2020-02-29 NOTE — Telephone Encounter (Signed)
Spoke with daughter and made her aware that a note has been placed in the chart that it is ok for her to accompany her father.  She thanked me for the call.

## 2020-03-01 ENCOUNTER — Telehealth: Payer: Self-pay | Admitting: Radiology

## 2020-03-01 ENCOUNTER — Other Ambulatory Visit: Payer: Self-pay

## 2020-03-01 ENCOUNTER — Encounter: Payer: Self-pay | Admitting: Cardiology

## 2020-03-01 ENCOUNTER — Ambulatory Visit: Payer: Medicare HMO | Admitting: Cardiology

## 2020-03-01 VITALS — BP 128/68 | HR 66 | Ht 71.5 in | Wt 205.0 lb

## 2020-03-01 DIAGNOSIS — R55 Syncope and collapse: Secondary | ICD-10-CM | POA: Diagnosis not present

## 2020-03-01 NOTE — Patient Instructions (Signed)
Medication Instructions:  Your physician recommends that you continue on your current medications as directed. Please refer to the Current Medication list given to you today.  *If you need a refill on your cardiac medications before your next appointment, please call your pharmacy*   Lab Work: None ordered   Testing/Procedures: Your physician has recommended that you wear a 2 week holter monitor. Holter monitors are medical devices that record the heart's electrical activity. Doctors most often use these monitors to diagnose arrhythmias. Arrhythmias are problems with the speed or rhythm of the heartbeat. The monitor is a small, portable device. You can wear one while you do your normal daily activities. This is usually used to diagnose what is causing palpitations/syncope (passing out).   Follow-Up: At [facility], you and your health needs are our priority.  As part of our continuing mission to provide you with exceptional heart care, we have created designated Provider Care Teams.  These Care Teams include your primary Cardiologist (physician) and Advanced Practice Providers (APPs -  Physician Assistants and Nurse Practitioners) who all work together to provide you with the care you need, when you need it.  We recommend signing up for the patient portal called "MyChart".  Sign up information is provided on this After Visit Summary.  MyChart is used to connect with patients for Virtual Visits (Telemedicine).  Patients are able to view lab/test results, encounter notes, upcoming appointments, etc.  Non-urgent messages can be sent to your provider as well.   To learn more about what you can do with MyChart, go to [email or web address].    Your next appointment:    to be determined after monitor  The format for your next appointment:   In Person  Provider:   Allegra Lai, MD   Thank you for choosing [facility]!!   Trinidad Curet, RN [phone]    Other  Instructions  ZIO XT- Long Term Monitor Instructions   Your physician has requested you wear your ZIO patch monitor_______days.   This is a single patch monitor.  [employer] supplies one patch monitor per enrollment.  Additional stickers are not available.   Please do not apply patch if you will be having a Nuclear Stress Test, Echocardiogram, Cardiac CT, MRI, or Chest Xray during the time frame you would be wearing the monitor. The patch cannot be worn during these tests.  You cannot remove and re-apply the ZIO XT patch monitor.   Your ZIO patch monitor will be sent USPS Priority mail from [facility] directly to your home address. The monitor may also be mailed to a PO BOX if home delivery is not available.   It may take 3-5 days to receive your monitor after you have been enrolled.   Once you have received you monitor, please review enclosed instructions.  Your monitor has already been registered assigning a specific monitor serial # to you.   Applying the monitor   Shave hair from upper left chest.   Hold abrader disc by orange tab.  Rub abrader in 40 strokes over left upper chest as indicated in your monitor instructions.   Clean area with 4 enclosed alcohol pads .  Use all pads to assure are is cleaned thoroughly.  Let dry.   Apply patch as indicated in monitor instructions.  Patch will be place under collarbone on left side of chest with arrow pointing upward.   Rub patch adhesive wings for 2 minutes.Remove white label marked "1".  Remove white label marked "2".  Rub patch adhesive wings for 2 additional minutes.   While looking in a mirror, press and release button in center of patch.  A small green light will flash 3-4 times .  This will be your only indicator the monitor has been turned on.     Do not shower for the first 24 hours.  You may shower after the first 24 hours.   Press button if you feel a symptom. You will hear a small click.  Record Date, Time and Symptom  in the Patient Log Book.   When you are ready to remove patch, follow instructions on last 2 pages of Patient Log Book.  Stick patch monitor onto last page of Patient Log Book.   Place Patient Log Book in [place] box.  Use locking tab on box and tape box closed securely.  The [employer] has [employer] on it.  Please place in mailbox as soon as possible.  Your physician should have your test results approximately 7 days after the monitor has been mailed back to [facility].   Call [facility] at [phone] if you have questions regarding your ZIO XT patch monitor.  Call them immediately if you see an orange light blinking on your monitor.   If your monitor falls off in less than 4 days contact our Monitor department at [phone].  If your monitor becomes loose or falls off after 4 days call [employer] at [phone] for suggestions on securing your monitor.     Ambulatory Cardiac Monitoring An ambulatory cardiac monitor is a small recording device that is used to detect abnormal heart rhythms (arrhythmias). Most monitors are connected by wires to flat, sticky disks (electrodes) that are then attached to your chest. You may need to wear a monitor if you have had symptoms such as:  Fast heartbeats (palpitations).  Dizziness.  Fainting or light-headedness.  Unexplained weakness.  Shortness of breath. There are several types of monitors. Some common monitors include:  Holter monitor. This records your heart rhythm continuously, usually for 24-48 hours.  Event (episodic) monitor. This monitor has a symptoms button, and when pushed, it will begin recording. You need to activate this monitor to record when you have a heart-related symptom.  Automatic detection monitor. This monitor will begin recording when it detects an abnormal heartbeat. What are the risks? Generally, these devices are safe to use. However, it is possible that the skin under the  electrodes will become irritated. How to prepare for monitoring Your health care provider will prepare your chest for the electrode placement and show you how to use the monitor.  Do not apply lotions to your chest before monitoring.  Follow directions on how to care for the monitor, and how to return the monitor when the testing period is complete. How to use your cardiac monitor  Follow directions about how long to wear the monitor, and if you can take the monitor off in order to shower or bathe. ? Do not let the monitor get wet. ? Do not bathe, swim, or use a hot tub while wearing the monitor.  Keep your skin clean. Do not put body lotion or moisturizer on your chest.  Change the electrodes as told by your health care provider, or any time they stop sticking to your skin. You may need to use medical tape to keep them on.  Try to put the electrodes in slightly different places on your chest to help prevent skin irritation. Follow directions  from your health care provider about where to place the electrodes.  Make sure the monitor is safely clipped to your clothing or in a location close to your body as recommended by your health care provider.  If your monitor has a symptoms button, press the button to mark an event as soon as you feel a heart-related symptom, such as: ? Dizziness. ? Weakness. ? Light-headedness. ? Palpitations. ? Thumping or pounding in your chest. ? Shortness of breath. ? Unexplained weakness.  Keep a diary of your activities, such as walking, doing chores, and taking medicine. It is very important to note what you were doing when you pushed the button to record your symptoms. This will help your health care provider determine what might be contributing to your symptoms.  Send the recorded information as recommended by your health care provider. It may take some time for your health care provider to process the results.  Change the batteries as told by your  health care provider.  Keep electronic devices away from your monitor. These include: ? Tablets. ? MP3 players. ? Cell phones.  While wearing your monitor you should avoid: ? Electric blankets. ? Armed forces operational officer. ? Electric toothbrushes. ? Microwave ovens. ? Magnets. ? Metal detectors. Get help right away if:  You have chest pain.  You have shortness of breath or extreme difficulty breathing.  You develop a very fast heartbeat that does not get better.  You develop dizziness that does not go away.  You faint or constantly feel like you are about to faint. Summary  An ambulatory cardiac monitor is a small recording device that is used to detect abnormal heart rhythms (arrhythmias).  Make sure you understand how to send the information from the monitor to your health care provider.  It is important to press the button on the monitor when you have any heart-related symptoms.  Keep a diary of your activities, such as walking, doing chores, and taking medicine. It is very important to note what you were doing when you pushed the button to record your symptoms. This will help your health care provider learn what might be causing your symptoms. This information is not intended to replace advice given to you by your health care provider. Make sure you discuss any questions you have with your health care provider. Document Revised: 10/02/2017 Document Reviewed: 10/04/2016 Elsevier Patient Education  2020 [employer].

## 2020-03-01 NOTE — Progress Notes (Signed)
Electrophysiology Office Note   Date:  03/01/2020   ID:  Szymon Infante, DOB 08-Dec-1935, MRN [id number]  PCP:  Alan Ripper, PA  Cardiologist:  Bettina Gavia Primary Electrophysiologist:  Constance Haw, MD    Chief Complaint: syncope   History of Present Illness: Drew Hudson is a 84 y.o. male who is being seen today for the evaluation of syncope at the request of Alan Ripper, [place]. Presenting today for electrophysiology evaluation.  He has a history significant for CKD stage III, type 2 diabetes, hypertension, OSA on CPAP.  He was admitted to [facility] with an episode of syncope.  He was not seen by cardiology at the time of the admission.  His ECG showed a bifascicular block with an echo showing a normal ejection fraction with moderate LVH.  The syncopal episode was quite abrupt.  This occurred after he got off the toilet at his girlfriend's house.  He had no warning.  He had some abrasions but no severe injury.  He was confused afterwards but had no postictal activity.  In the hospital, he had no arrhythmia on telemetry.  His episode of syncope occurred right after he was standing up to urinate.  He says he finished urinating, turned around, got dizzy and lightheaded and passed out.  Since his episode of syncope, he has felt palpitations.  He is also had headaches, but he did hit his head.  His palpitations occur mainly in the mornings.  He has not had any further episodes of syncope.  Today, he denies symptoms of  chest pain, shortness of breath, orthopnea, PND, lower extremity edema, claudication, dizziness, presyncope, syncope, bleeding, or neurologic sequela. The patient is tolerating medications without difficulties.    Past Medical History:  Diagnosis Date  . Arthritis   . Cataract   . Chronic kidney disease    Stage 3 per pt  . Diabetes mellitus without complication (HCC)    diet controlled  . Dizziness   . Dry eyes   . Dysrhythmia   . Edema    Bilateral legs  . Fecal incontinence   . Generalized weakness   . GERD (gastroesophageal reflux disease)   . Glaucoma   . Headache   . History of chest pain    Saw cardiologist, no blockages noted, possible gas pains  . Hyperlipidemia   . Hypertension   . Idiopathic peripheral neuropathy   . Insomnia   . OSA on CPAP   . Pneumonia   . Prostate cancer ([place])   . Urinary urgency   . Vitamin D deficiency   . Wears glasses   . Wears hearing aid in both ears   . Wears partial dentures    Past Surgical History:  Procedure Laterality Date  . ANAL RECTAL MANOMETRY  10/08/2018  . ANKLE SURGERY    . BIOPSY THYROID Left 04/08/2015  . CARDIAC CATHETERIZATION    . COLONOSCOPY    . CYSTOSCOPY    . EPIDIDYMIS SURGERY    . EXCISIONAL TOTAL KNEE ARTHROPLASTY WITH ANTIBIOTIC SPACERS Right 08/22/2019   Procedure: Right knee resection arthroplasty; antibiotic spacer;  Surgeon: Gaynelle Arabian, MD;  Location: [facility];  Service: Orthopedics;  Laterality: Right;  54min  . HEMORRHOID SURGERY    . PENILE PROSTHESIS IMPLANT    . PROSTATECTOMY    . REIMPLANTATION OF TOTAL KNEE Right 12/05/2019   Procedure: REIMPLANTATION OF TOTAL KNEE;  Surgeon: Gaynelle Arabian, MD;  Location: [facility];  Service: Orthopedics;  Laterality: Right;  15min  .  TOTAL KNEE ARTHROPLASTY Right 10/18/2018   Procedure: RIGHT TOTAL KNEE ARTHROPLASTY;  Surgeon: Gaynelle Arabian, MD;  Location: [facility];  Service: Orthopedics;  Laterality: Right;  97min     Current Outpatient Medications  Medication Sig Dispense Refill  . atorvastatin (LIPITOR) 20 MG tablet Take 20 mg by mouth at bedtime.   0  . cholecalciferol (VITAMIN D3) 25 MCG (1000 UNIT) tablet Take 1,000 Units by mouth daily.    . dorzolamide-timolol (COSOPT) 22.3-6.8 MG/ML ophthalmic solution Place 1 drop into both eyes 2 (two) times daily.  0  . ferrous sulfate 325 (65 FE) MG EC tablet Take 325 mg by mouth daily with breakfast.    . latanoprost (XALATAN) 0.005 % ophthalmic  solution Place 1 drop into both eyes at bedtime.   0  . lisinopril (ZESTRIL) 30 MG tablet Take 30 mg by mouth at bedtime.     . metFORMIN (GLUCOPHAGE) 500 MG tablet Take 500 mg by mouth daily.    Marland Kitchen omeprazole (PRILOSEC) 20 MG capsule Take 20 mg by mouth every evening.   3  . polyethylene glycol (MIRALAX / GLYCOLAX) packet Take 17 g by mouth daily as needed for moderate constipation.    . polyvinyl alcohol (LIQUIFILM TEARS) 1.4 % ophthalmic solution Place 1 drop into both eyes 2 (two) times daily.     . vitamin E 180 MG (400 UNITS) capsule Take 400 Units by mouth daily.     No current facility-administered medications for this visit.    Allergies:   Lovastatin and Penicillins   Social History:  The patient  reports that he has quit smoking. He has never used smokeless tobacco. He reports that he does not drink alcohol or use drugs.   Family History:  The patient's family history includes Clotting disorder in his father; Hypertension in his mother.    ROS:  Please see the history of present illness.   Otherwise, review of systems is positive for none.   All other systems are reviewed and negative.    PHYSICAL EXAM: VS:  BP 128/68   Pulse 66   Ht 5' 11.5" (1.816 m)   Wt 205 lb (93 kg)   BMI 28.19 kg/m  , BMI Body mass index is 28.19 kg/m. GEN: Well nourished, well developed, in no acute distress  HEENT: normal  Neck: no JVD, carotid bruits, or masses Cardiac: RRR; no murmurs, rubs, or gallops,no edema  Respiratory:  clear to auscultation bilaterally, normal work of breathing GI: soft, nontender, nondistended, + BS MS: no deformity or atrophy  Skin: warm and dry Neuro:  Strength and sensation are intact Psych: euthymic mood, full affect  EKG:  EKG is ordered today. Personal review of the ekg ordered shows sinus rhythm, first-degree AV block, right bundle branch block, rate 66  Recent Labs: 11/30/2019: ALT 13 02/06/2020: TSH 1.158 02/08/2020: BUN 18; Creatinine, Ser 1.33;  Hemoglobin 11.7; Platelets 184; Potassium 4.0; Sodium 141    Lipid Panel  No results found for: CHOL, TRIG, HDL, CHOLHDL, VLDL, LDLCALC, LDLDIRECT   Wt Readings from Last 3 Encounters:  03/01/20 205 lb (93 kg)  02/17/20 203 lb (92.1 kg)  02/09/20 205 lb 7.5 oz (93.2 kg)      Other studies Reviewed: Additional studies/ records that were reviewed today include: TTE 02/07/20  Review of the above records today demonstrates:  1. Left ventricular ejection fraction, by estimation, is 65 to 70%. The  left ventricle has hyperdynamic function. The left ventricle has no  regional wall  motion abnormalities. There is moderate concentric left  ventricular hypertrophy. Left ventricular  diastolic parameters are consistent with Grade I diastolic dysfunction  (impaired relaxation).  2. Right ventricular systolic function is normal. The right ventricular  size is normal. There is mildly elevated pulmonary artery systolic  pressure. The estimated right ventricular systolic pressure is 99991111 mmHg.  3. Left atrial size was mildly dilated.  4. The mitral valve is normal in structure. Mild to moderate mitral valve  regurgitation.  5. The aortic valve is normal in structure. Aortic valve regurgitation is  trivial. No aortic stenosis is present.  6. The inferior vena cava is dilated in size with >50% respiratory  variability, suggesting right atrial pressure of 8 mmHg.   ASSESSMENT AND PLAN:  1.  Syncope with bifascicular block: This point is unclear to me as to the cause of his syncope.  It could certainly be due to his bifascicular block, but this was also around the time of urinating.  It is possible that he had micturition syncope.  If this did occur, he may not need pacemaker implant, though with his bifascicular block, that certainly may be in his future.  He is also having palpitations.  Due to that, we Doreena Maulden place a 14-day monitor.  We Gar Glance make a determination based on pacemaker or other  therapy post monitor.  2.  Hypertension: Currently well controlled  Case discussed with referring cardiologist  Current medicines are reviewed at length with the patient today.   The patient does not have concerns regarding his medicines.  The following changes were made today:  none  Labs/ tests ordered today include:  Orders Placed This Encounter  Procedures  . LONG TERM MONITOR (3-14 DAYS)  . EKG 12-Lead     Disposition:   FU with Pharrah Rottman 3 months  Signed, Jakarri Lesko Meredith Leeds, MD  03/01/2020 3:49 PM     [facility] [address] [place] [address] [phone] (office) [phone] (fax)

## 2020-03-01 NOTE — Telephone Encounter (Signed)
Enrolled patient for a 14 day Zio monitor to be mailed to patients home.  

## 2020-03-02 ENCOUNTER — Telehealth: Payer: Self-pay | Admitting: Cardiology

## 2020-03-02 NOTE — Telephone Encounter (Signed)
Patient's daughter called stating she has misplaced the paperwork Dr. Curt Bears gave to them.  She is wondering if it can mailed to her or she can stop by the office to pick it up

## 2020-03-02 NOTE — Telephone Encounter (Signed)
Informed that I would leave AVS from yesterday's appt at the [facility] office Monday afternoon.  Pick up around 4pm. dtr appreciated the help with this.

## 2020-03-05 ENCOUNTER — Other Ambulatory Visit (INDEPENDENT_AMBULATORY_CARE_PROVIDER_SITE_OTHER): Payer: Medicare HMO

## 2020-03-05 DIAGNOSIS — R55 Syncope and collapse: Secondary | ICD-10-CM

## 2020-08-14 ENCOUNTER — Ambulatory Visit: Payer: Medicare HMO | Attending: Critical Care Medicine

## 2020-08-14 DIAGNOSIS — Z23 Encounter for immunization: Secondary | ICD-10-CM

## 2020-08-14 NOTE — Progress Notes (Signed)
   Covid-19 Vaccination Clinic  Name:  Chey Cho    MRN: [id number] DOB: 1936/05/23  08/14/2020  Mr. Bunte was observed post Covid-19 immunization for 15 minutes without incident. He was provided with Vaccine Information Sheet and instruction to access the V-Safe system.   Mr. Ferrufino was instructed to call 911 with any severe reactions post vaccine: Marland Kitchen Difficulty breathing  . Swelling of face and throat  . A fast heartbeat  . A bad rash all over body  . Dizziness and weakness

## 2021-01-09 ENCOUNTER — Telehealth: Payer: Self-pay | Admitting: Cardiology

## 2021-01-09 NOTE — Telephone Encounter (Signed)
Spoke to the patient just now and he let me know that he has been having chest pain and a bounding heart rate in the mornings while he is laying in bed. This does not happen every morning. He states that the chest pain is not severe but it is more than just a slight pain. He denies pressure on the chest, SOB, dizziness, nausea/vommiting. He states that he can exercise without any difficulty throughout the day. The rapid heart is also always in the morning while in bed and does not always accompany the chest pain. He does not have any heart rate readings to give me. He does state that his blood pressure is 121/73 right now. He is not having any chest pain at this time.   Patient is taking all medications correctly per his medication list.   I advised him that if he were to have worsening symptoms or the chest pain were to come back and did not go away within a couple of minutes to please proceed to the ED and notify our office. He states that he will do this.   I will route to Dr. Bettina Gavia for any further advise. Patient is aware that it will be later this afternoon when I call him back and is agreeable to this plan.

## 2021-01-09 NOTE — Telephone Encounter (Signed)
Pt c/o of Chest Pain: STAT if CP now or developed within 24 hours  1. Are you having CP right now? slightly  2. Are you experiencing any other symptoms (ex. SOB, nausea, vomiting, sweating)? no  3. How long have you been experiencing CP? Off and on, but gotten worse  4. Is your CP continuous or coming and going? Comes and goes, in morning when laying down  5. Have you taken Nitroglycerin? No  Patient states he has been having a racing heart in the mornings and also gets chest pain. He states he does not know what his HR has been. He states both symptoms come and go, but only happen in the mornings when he is laying down. He states it has gotten worse, but is not having any other symptoms. He states he is not sure if it is gas.

## 2021-01-09 NOTE — Telephone Encounter (Signed)
Left message on patients voicemail to please return our call.   

## 2021-01-09 NOTE — Telephone Encounter (Signed)
This is not a problem I follow the patient for he does not have coronary artery disease and I think he should go to [facility] for evaluation for chest pain

## 2021-01-09 NOTE — Telephone Encounter (Signed)
Left message on patients home and cell voicemail to please return our call.

## 2021-01-10 ENCOUNTER — Other Ambulatory Visit: Payer: Self-pay

## 2021-01-10 ENCOUNTER — Encounter (HOSPITAL_BASED_OUTPATIENT_CLINIC_OR_DEPARTMENT_OTHER): Payer: Self-pay

## 2021-01-10 ENCOUNTER — Emergency Department (HOSPITAL_BASED_OUTPATIENT_CLINIC_OR_DEPARTMENT_OTHER): Payer: Medicare HMO

## 2021-01-10 ENCOUNTER — Emergency Department (HOSPITAL_BASED_OUTPATIENT_CLINIC_OR_DEPARTMENT_OTHER)
Admission: EM | Admit: 2021-01-10 | Discharge: 2021-01-10 | Disposition: A | Discharge status: Home / Self Care | Payer: Medicare HMO | Attending: Emergency Medicine | Admitting: Emergency Medicine

## 2021-01-10 DIAGNOSIS — E785 Hyperlipidemia, unspecified: Secondary | ICD-10-CM | POA: Diagnosis not present

## 2021-01-10 DIAGNOSIS — Z96651 Presence of right artificial knee joint: Secondary | ICD-10-CM | POA: Diagnosis not present

## 2021-01-10 DIAGNOSIS — Z7984 Long term (current) use of oral hypoglycemic drugs: Secondary | ICD-10-CM | POA: Insufficient documentation

## 2021-01-10 DIAGNOSIS — R0789 Other chest pain: Secondary | ICD-10-CM | POA: Diagnosis not present

## 2021-01-10 DIAGNOSIS — E1122 Type 2 diabetes mellitus with diabetic chronic kidney disease: Secondary | ICD-10-CM | POA: Diagnosis not present

## 2021-01-10 DIAGNOSIS — E1142 Type 2 diabetes mellitus with diabetic polyneuropathy: Secondary | ICD-10-CM | POA: Insufficient documentation

## 2021-01-10 DIAGNOSIS — Z8546 Personal history of malignant neoplasm of prostate: Secondary | ICD-10-CM | POA: Insufficient documentation

## 2021-01-10 DIAGNOSIS — I129 Hypertensive chronic kidney disease with stage 1 through stage 4 chronic kidney disease, or unspecified chronic kidney disease: Secondary | ICD-10-CM | POA: Diagnosis not present

## 2021-01-10 DIAGNOSIS — E1169 Type 2 diabetes mellitus with other specified complication: Secondary | ICD-10-CM | POA: Diagnosis not present

## 2021-01-10 DIAGNOSIS — E1136 Type 2 diabetes mellitus with diabetic cataract: Secondary | ICD-10-CM | POA: Insufficient documentation

## 2021-01-10 DIAGNOSIS — N183 Chronic kidney disease, stage 3 unspecified: Secondary | ICD-10-CM | POA: Diagnosis not present

## 2021-01-10 DIAGNOSIS — R002 Palpitations: Secondary | ICD-10-CM | POA: Diagnosis present

## 2021-01-10 DIAGNOSIS — Z79899 Other long term (current) drug therapy: Secondary | ICD-10-CM | POA: Insufficient documentation

## 2021-01-10 LAB — CBC
HCT: 43.1 % (ref 39.0–52.0)
Hemoglobin: 14.4 g/dL (ref 13.0–17.0)
MCH: 28.2 pg (ref 26.0–34.0)
MCHC: 33.4 g/dL (ref 30.0–36.0)
MCV: 84.3 fL (ref 80.0–100.0)
Platelets: 165 10*3/uL (ref 150–400)
RBC: 5.11 MIL/uL (ref 4.22–5.81)
RDW: 13.7 % (ref 11.5–15.5)
WBC: 4.2 10*3/uL (ref 4.0–10.5)
nRBC: 0 % (ref 0.0–0.2)

## 2021-01-10 LAB — BASIC METABOLIC PANEL
Anion gap: 10 (ref 5–15)
BUN: 23 mg/dL (ref 8–23)
CO2: 23 mmol/L (ref 22–32)
Calcium: 9.1 mg/dL (ref 8.9–10.3)
Chloride: 106 mmol/L (ref 98–111)
Creatinine, Ser: 1.52 mg/dL — ABNORMAL HIGH (ref 0.61–1.24)
GFR, Estimated: 45 mL/min — ABNORMAL LOW (ref >60)
Glucose, Bld: 105 mg/dL — ABNORMAL HIGH (ref 70–99)
Potassium: 4 mmol/L (ref 3.5–5.1)
Sodium: 139 mmol/L (ref 135–145)

## 2021-01-10 LAB — TROPONIN I (HIGH SENSITIVITY)
Troponin I (High Sensitivity): 4 ng/L (ref <18)
Troponin I (High Sensitivity): 4 ng/L (ref <18)

## 2021-01-10 NOTE — Discharge Instructions (Signed)
Continue your current meds  Please call your cardiologist and you may need another Zio patch if you have persistent palpitations  Return to ER if you have worse palpitations, chest pain, trouble breathing

## 2021-01-10 NOTE — ED Provider Notes (Signed)
[facility] EMERGENCY DEPARTMENT Provider Note   CSN: [id number] Arrival date & time: 01/10/21  1318     History Chief Complaint  Patient presents with  . Tachycardia  . Chest Pain    Drew Hudson is a 85 y.o. male history of CKD, diabetes, here presenting with palpitations.  Patient states that he has been having intermittent palpitations for the last year or so.  He states that since this morning it has been more persistent.  He states that by the time he got here it has resolved on its own.  He has some chest pressure at that time.  Denies any shortness of breath.  Patient also had a Zio patch placed about a year ago for the same thing and never found anything.  No history of A. fib and patient is not on any blood thinners  The history is provided by the patient.       Past Medical History:  Diagnosis Date  . Arthritis   . Cataract   . Chronic kidney disease    Stage 3 per pt  . Diabetes mellitus without complication (HCC)    diet controlled  . Dizziness   . Dry eyes   . Dysrhythmia   . Edema    Bilateral legs  . Fecal incontinence   . Generalized weakness   . GERD (gastroesophageal reflux disease)   . Glaucoma   . Headache   . History of chest pain    Saw cardiologist, no blockages noted, possible gas pains  . Hyperlipidemia   . Hypertension   . Idiopathic peripheral neuropathy   . Insomnia   . OSA on CPAP   . Pneumonia   . Prostate cancer ([place])   . Urinary urgency   . Vitamin D deficiency   . Wears glasses   . Wears hearing aid in both ears   . Wears partial dentures     Patient Active Problem List   Diagnosis Date Noted  . 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 GERD (gastroesophageal reflux disease) 02/17/2020  . History of cardiac cath 02/17/2020  . HLD (hyperlipidemia) 02/17/2020  . Urinary urgency 02/17/2020  . Vitamin D deficiency 02/17/2020  . Vasomotor rhinitis 02/17/2020  . Syncope 02/06/2020  .  Essential hypertension 02/06/2020  . OSA on CPAP 02/06/2020  . Septic arthritis of knee, right ([place]) 08/22/2019  . Thyroid nodule 03/02/2019  . OA (osteoarthritis) of knee 10/18/2018  . Dry eye syndrome of both lacrimal glands 10/11/2018  . Fatigue 10/29/2017  . Thrombocytopenia ([place]) 10/29/2017  . Multinodular goiter 01/02/2017  . Chronic kidney disease (CKD), stage III (moderate) ([place]) 01/16/2016  . Constipation 01/16/2016  . Increased urinary frequency 01/16/2016  . Controlled type 2 diabetes mellitus with stage 3 chronic kidney disease, without long-term current use of insulin ([place]) 10/17/2015  . History of prostate cancer 03/15/2015  . Organic impotence 03/15/2015  . Prostate CA ([place]) 03/15/2015  . Nocturia 03/15/2015  . Glaucoma 01/12/2012  . Cataract 10/14/2011  . Ischemic optic neuropathy 10/14/2011    Past Surgical History:  Procedure Laterality Date  . ANAL RECTAL MANOMETRY  10/08/2018  . ANKLE SURGERY    . BIOPSY THYROID Left 04/08/2015  . CARDIAC CATHETERIZATION    . COLONOSCOPY    . CYSTOSCOPY    . EPIDIDYMIS SURGERY    . EXCISIONAL TOTAL KNEE ARTHROPLASTY WITH ANTIBIOTIC SPACERS Right 08/22/2019   Procedure: Right knee resection arthroplasty; antibiotic spacer;  Surgeon: Gaynelle Arabian, MD;  Location: [facility];  Service: Orthopedics;  Laterality: Right;  15min  . HEMORRHOID SURGERY    . PENILE PROSTHESIS IMPLANT    . PROSTATECTOMY    . REIMPLANTATION OF TOTAL KNEE Right 12/05/2019   Procedure: REIMPLANTATION OF TOTAL KNEE;  Surgeon: Gaynelle Arabian, MD;  Location: [facility];  Service: Orthopedics;  Laterality: Right;  162min  . TOTAL KNEE ARTHROPLASTY Right 10/18/2018   Procedure: RIGHT TOTAL KNEE ARTHROPLASTY;  Surgeon: Gaynelle Arabian, MD;  Location: [facility];  Service: Orthopedics;  Laterality: Right;  41min       Family History  Problem Relation Age of Onset  . Hypertension Mother   . Clotting disorder Father     Social History   Tobacco Use  . Smoking  status: Former Research scientist (life sciences)  . Smokeless tobacco: Never Used  Vaping Use  . Vaping Use: Never used  Substance Use Topics  . Alcohol use: Never  . Drug use: Never    Home Medications Prior to Admission medications   Medication Sig Start Date End Date Taking? Authorizing Provider  atorvastatin (LIPITOR) 20 MG tablet Take 20 mg by mouth at bedtime.  07/14/18   [provider]  cholecalciferol (VITAMIN D3) 25 MCG (1000 UNIT) tablet Take 1,000 Units by mouth daily.    [provider]  dorzolamide-timolol (COSOPT) 22.3-6.8 MG/ML ophthalmic solution Place 1 drop into both eyes 2 (two) times daily. 09/28/18   [provider]  ferrous sulfate 325 (65 FE) MG EC tablet Take 325 mg by mouth daily with breakfast.    [provider]  latanoprost (XALATAN) 0.005 % ophthalmic solution Place 1 drop into both eyes at bedtime.  10/05/18   [provider]  lisinopril (ZESTRIL) 30 MG tablet Take 30 mg by mouth at bedtime.     [provider]  metFORMIN (GLUCOPHAGE) 500 MG tablet Take 500 mg by mouth daily. 10/20/19   [provider]  omeprazole (PRILOSEC) 20 MG capsule Take 20 mg by mouth every evening.  10/05/18   [provider]  polyethylene glycol (MIRALAX / GLYCOLAX) packet Take 17 g by mouth daily as needed for moderate constipation.    [provider]  polyvinyl alcohol (LIQUIFILM TEARS) 1.4 % ophthalmic solution Place 1 drop into both eyes 2 (two) times daily.     [provider]  vitamin E 180 MG (400 UNITS) capsule Take 400 Units by mouth daily.    [provider]    Allergies    Lovastatin and Penicillins  Review of Systems   Review of Systems  Cardiovascular: Positive for chest pain.  All other systems reviewed and are negative.   Physical Exam Updated Vital Signs BP (!) 143/74   Pulse (!) 55   Temp 98.6 F (37 C) (Oral)   Resp (!) 21   Ht 5\' 11"  (1.803 m)   Wt 93 kg   SpO2 99%   BMI 28.59  kg/m   Physical Exam Vitals and nursing note reviewed.  Constitutional:      Appearance: He is well-developed.  HENT:     Head: Normocephalic.  Eyes:     Extraocular Movements: Extraocular movements intact.     Pupils: Pupils are equal, round, and reactive to light.  Cardiovascular:     Rate and Rhythm: Normal rate and regular rhythm.     Heart sounds: Normal heart sounds.  Pulmonary:     Effort: Pulmonary effort is normal.     Breath sounds: Normal breath sounds.  Abdominal:  Palpations: Abdomen is soft.  Musculoskeletal:        General: Normal range of motion.     Cervical back: Normal range of motion and neck supple.  Skin:    General: Skin is warm.     Capillary Refill: Capillary refill takes less than 2 seconds.  Neurological:     General: No focal deficit present.     Mental Status: He is alert and oriented to person, place, and time.  Psychiatric:        Mood and Affect: Mood normal.        Behavior: Behavior normal.     ED Results / Procedures / Treatments   Labs (all labs ordered are listed, but only abnormal results are displayed) Labs Reviewed  BASIC METABOLIC PANEL - Abnormal; Notable for the following components:      Result Value   Glucose, Bld 105 (*)    Creatinine, Ser 1.52 (*)    GFR, Estimated 45 (*)    All other components within normal limits  CBC  TROPONIN I (HIGH SENSITIVITY)  TROPONIN I (HIGH SENSITIVITY)    EKG EKG Interpretation  Date/Time:  Thursday January 10 2021 16:12:20 EST Ventricular Rate:  56 PR Interval:  222 QRS Duration: 155 QT Interval:  475 QTC Calculation: 459 R Axis:   37 Text Interpretation: Sinus rhythm Prolonged PR interval Right bundle branch block Inferior infarct, acute poor baseline, unchanged from previous Confirmed by Wandra Arthurs [phone]) on 01/10/2021 4:37:34 PM   Radiology DG Chest 2 View  Result Date: 01/10/2021 CLINICAL DATA:  Chest pain and tachycardia EXAM: CHEST - 2 VIEW COMPARISON:  February 06, 2020 chest radiograph and chest CT FINDINGS: Lungs are clear. Heart size and pulmonary vascularity are normal. No adenopathy. No pneumothorax. No bone lesions. IMPRESSION: Lungs clear.  Cardiac silhouette normal. Electronically Signed   By: Lowella Grip III M.D.   On: 01/10/2021 14:24    Procedures Procedures   Medications Ordered in ED Medications - No data to display  ED Course  I have reviewed the triage vital signs and the nursing notes.  Pertinent labs & imaging results that were available during my care of the patient were reviewed by me and considered in my medical decision making (see chart for details).    MDM Rules/Calculators/A&P                         Drew Hudson is a 85 y.o. male here with palpitations.  Patient has been having palpitations for the last year or so.  Worsening palpitations this morning.  His heart rate is in the 50s.  I do not see any obvious heart block or arrhythmia on EKG.  Troponin negative x2.  His electrolytes were unremarkable.  At this point I think he is stable for discharge.  He can follow-up with cardiology and may need another Zio patch if he has persistent symptoms.   Final Clinical Impression(s) / ED Diagnoses Final diagnoses:  None    Rx / DC Orders ED Discharge Orders    None       Drenda Freeze, MD 01/10/21 1655

## 2021-01-10 NOTE — Telephone Encounter (Signed)
Spoke to the patient just now and let him know that he should be evaluated in the emergency room for this. He verbalizes understanding and thanks me for the call back.

## 2021-01-10 NOTE — ED Triage Notes (Signed)
Pt c/o intermittent fast heart rate and "fluttering" x 1+ year-felt it worse today ~630am-NAD-steady gait

## 2021-01-10 NOTE — ED Notes (Signed)
Patient verbalized understanding of dc instructions, prescriptions, follow up referrals and reasons to return to ER for reevaluation.  

## 2021-01-11 ENCOUNTER — Ambulatory Visit: Payer: Medicare HMO | Admitting: Nurse Practitioner

## 2021-12-15 IMAGING — DX DG CHEST 2V
2 series · 2 of 2 positions shown · non-contrast
Comparison: February 06, 2020 chest radiograph and chest CT

CLINICAL DATA: Chest pain and tachycardia

EXAM:
CHEST - 2 VIEW

[chest pa]
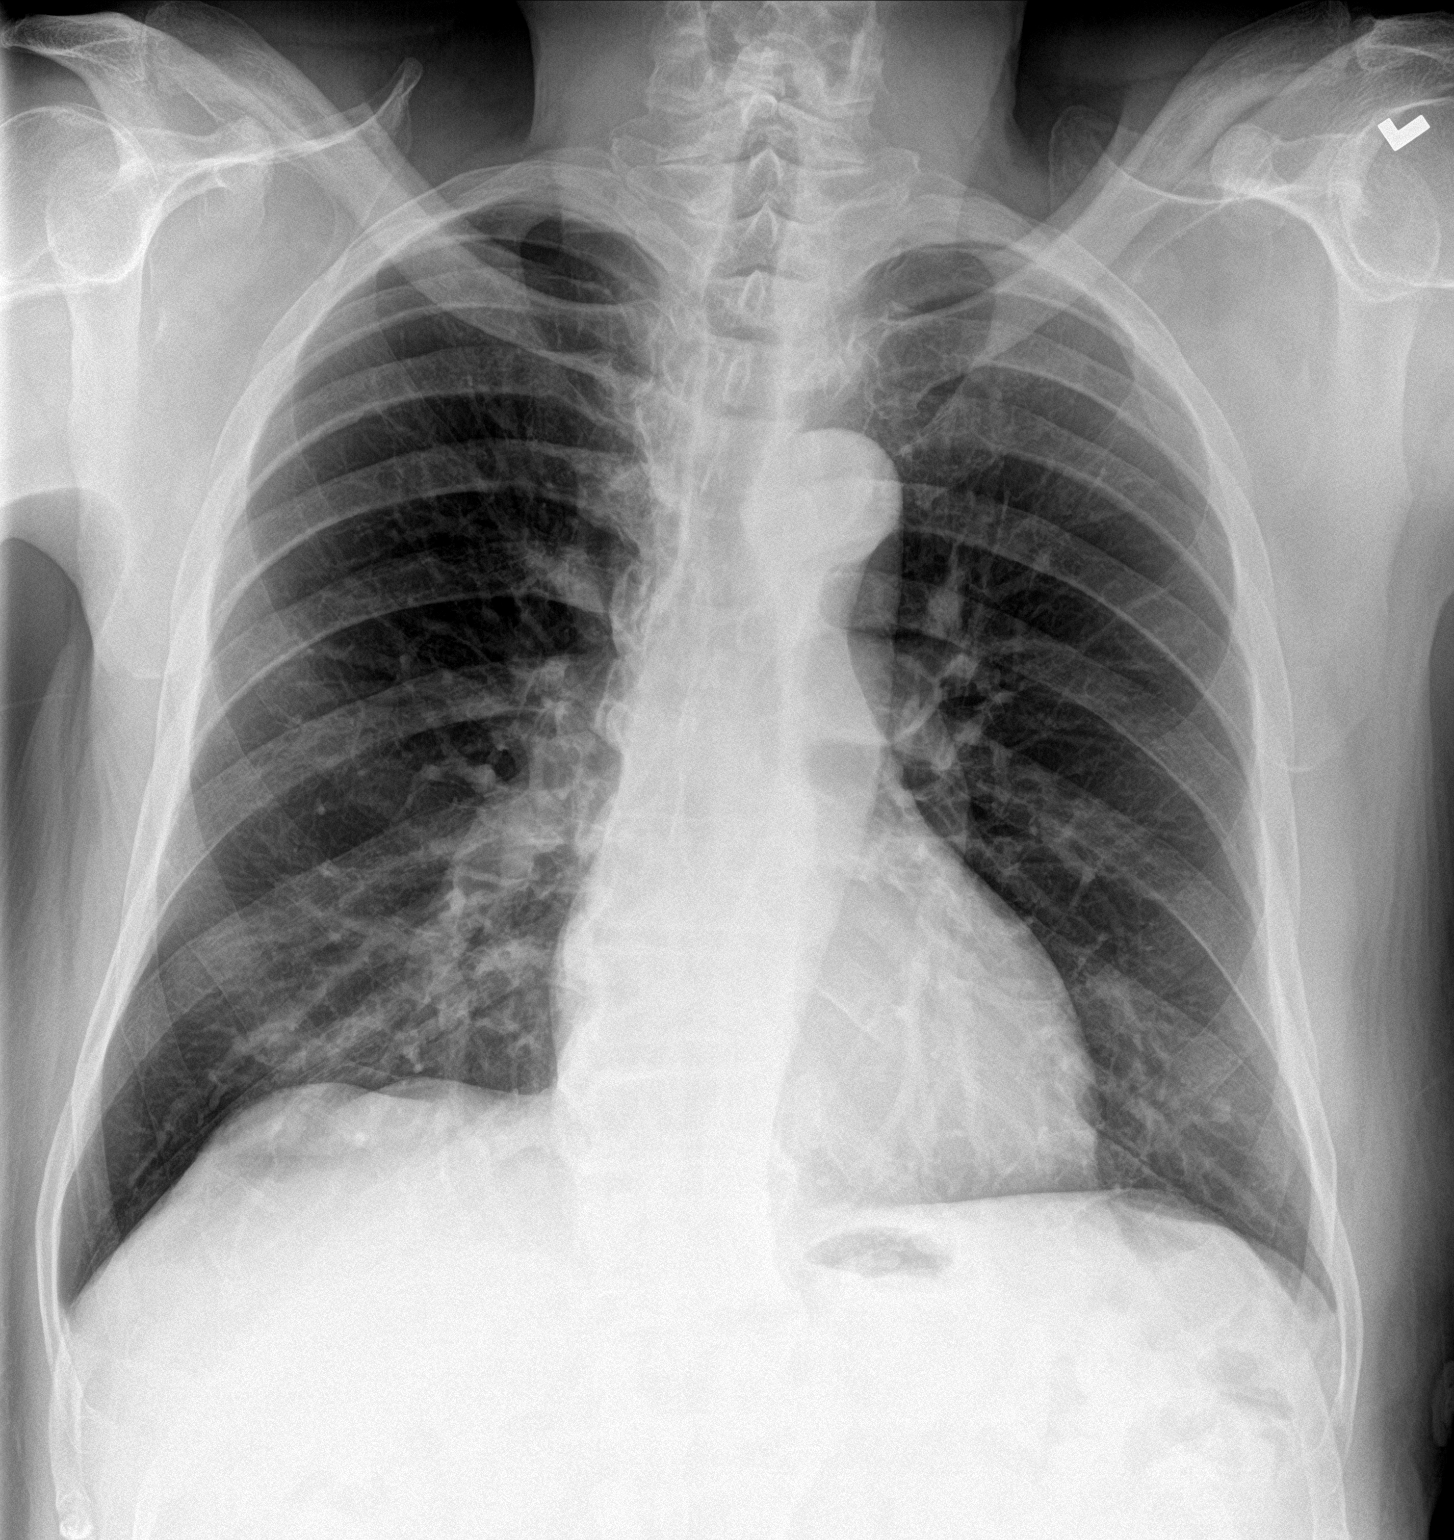

[chest lat]
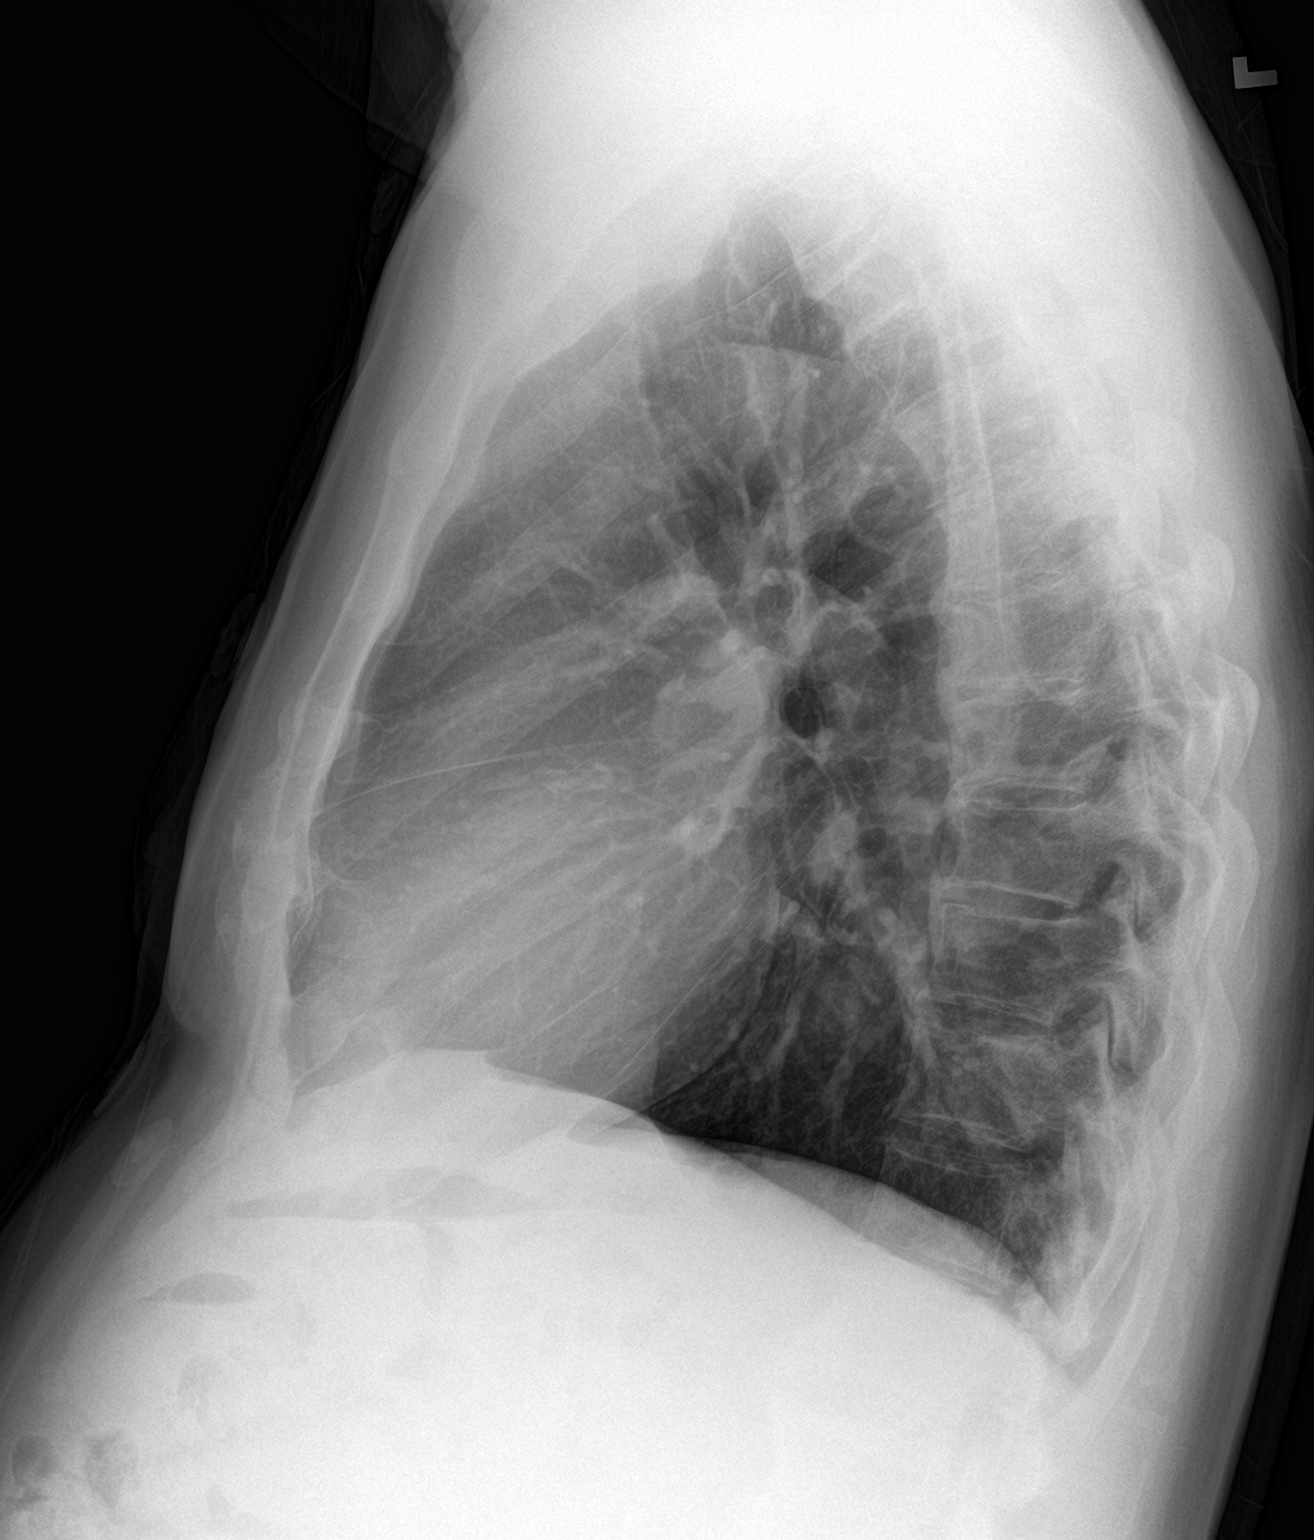

[2 of 2 positions shown; findings below may reference images not displayed]

FINDINGS: Lungs are clear. Heart size and pulmonary vascularity are normal. No
adenopathy. No pneumothorax. No bone lesions.
IMPRESSION: Lungs clear.  Cardiac silhouette normal.
